# Patient Record
Sex: Male | Born: 1950 | Race: White | Hispanic: No | Marital: Married | Smoking: Former smoker
Health system: Southern US, Community
[De-identification: ages and names within clinical notes are randomized; demographics above are authoritative.]

## PROBLEM LIST (undated history)

## (undated) DIAGNOSIS — I10 Essential (primary) hypertension: Secondary | ICD-10-CM

## (undated) DIAGNOSIS — E785 Hyperlipidemia, unspecified: Secondary | ICD-10-CM

## (undated) DIAGNOSIS — J449 Chronic obstructive pulmonary disease, unspecified: Secondary | ICD-10-CM

## (undated) DIAGNOSIS — M1990 Unspecified osteoarthritis, unspecified site: Secondary | ICD-10-CM

## (undated) DIAGNOSIS — E119 Type 2 diabetes mellitus without complications: Secondary | ICD-10-CM

## (undated) DIAGNOSIS — F0390 Unspecified dementia without behavioral disturbance: Secondary | ICD-10-CM

## (undated) DIAGNOSIS — I219 Acute myocardial infarction, unspecified: Secondary | ICD-10-CM

## (undated) DIAGNOSIS — I2510 Atherosclerotic heart disease of native coronary artery without angina pectoris: Secondary | ICD-10-CM

## (undated) DIAGNOSIS — K219 Gastro-esophageal reflux disease without esophagitis: Secondary | ICD-10-CM

## (undated) DIAGNOSIS — F329 Major depressive disorder, single episode, unspecified: Secondary | ICD-10-CM

## (undated) DIAGNOSIS — Z8669 Personal history of other diseases of the nervous system and sense organs: Secondary | ICD-10-CM

## (undated) DIAGNOSIS — F419 Anxiety disorder, unspecified: Secondary | ICD-10-CM

## (undated) DIAGNOSIS — F32A Depression, unspecified: Secondary | ICD-10-CM

## (undated) HISTORY — PX: TONSILLECTOMY: SUR1361

## (undated) HISTORY — PX: SHOULDER OPEN ROTATOR CUFF REPAIR: SHX2407

## (undated) HISTORY — PX: APPENDECTOMY: SHX54

## (undated) HISTORY — PX: CHOLECYSTECTOMY OPEN: SUR202

## (undated) HISTORY — PX: HAMMER TOE SURGERY: SHX385

## (undated) HISTORY — DX: Type 2 diabetes mellitus without complications: E11.9

## (undated) HISTORY — DX: Hyperlipidemia, unspecified: E78.5

## (undated) HISTORY — PX: KNEE ARTHROSCOPY: SUR90

## (undated) HISTORY — DX: Essential (primary) hypertension: I10

## (undated) HISTORY — DX: Chronic obstructive pulmonary disease, unspecified: J44.9

## (undated) HISTORY — DX: Personal history of other diseases of the nervous system and sense organs: Z86.69

## (undated) HISTORY — PX: ROTATOR CUFF REPAIR: SHX139

---

## 1997-12-09 ENCOUNTER — Inpatient Hospital Stay (HOSPITAL_COMMUNITY): Admission: EM | Admit: 1997-12-09 | Discharge: 1997-12-12 | Payer: Self-pay | Admitting: Cardiology

## 1999-01-13 ENCOUNTER — Ambulatory Visit (HOSPITAL_BASED_OUTPATIENT_CLINIC_OR_DEPARTMENT_OTHER): Admission: RE | Admit: 1999-01-13 | Discharge: 1999-01-13 | Payer: Self-pay | Admitting: Orthopedic Surgery

## 2002-10-17 ENCOUNTER — Inpatient Hospital Stay (HOSPITAL_COMMUNITY): Admission: EM | Admit: 2002-10-17 | Discharge: 2002-10-18 | Payer: Self-pay | Admitting: Cardiology

## 2004-01-08 ENCOUNTER — Observation Stay (HOSPITAL_COMMUNITY): Admission: RE | Admit: 2004-01-08 | Discharge: 2004-01-09 | Payer: Self-pay | Admitting: Orthopedic Surgery

## 2005-08-03 ENCOUNTER — Ambulatory Visit (HOSPITAL_COMMUNITY): Admission: RE | Admit: 2005-08-03 | Discharge: 2005-08-03 | Payer: Self-pay | Admitting: Orthopedic Surgery

## 2007-07-12 ENCOUNTER — Ambulatory Visit (HOSPITAL_COMMUNITY): Payer: Self-pay | Admitting: Psychiatry

## 2007-07-16 ENCOUNTER — Ambulatory Visit (HOSPITAL_COMMUNITY): Payer: Self-pay | Admitting: Psychiatry

## 2007-07-16 ENCOUNTER — Emergency Department (HOSPITAL_COMMUNITY): Admission: EM | Admit: 2007-07-16 | Discharge: 2007-07-16 | Payer: Self-pay | Admitting: Emergency Medicine

## 2007-07-16 ENCOUNTER — Inpatient Hospital Stay (HOSPITAL_COMMUNITY): Admission: RE | Admit: 2007-07-16 | Discharge: 2007-07-27 | Payer: Self-pay | Admitting: *Deleted

## 2007-07-16 ENCOUNTER — Ambulatory Visit: Payer: Self-pay | Admitting: *Deleted

## 2007-07-22 ENCOUNTER — Emergency Department (HOSPITAL_COMMUNITY): Admission: EM | Admit: 2007-07-22 | Discharge: 2007-07-22 | Payer: Self-pay | Admitting: Emergency Medicine

## 2007-07-23 ENCOUNTER — Encounter (INDEPENDENT_AMBULATORY_CARE_PROVIDER_SITE_OTHER): Payer: Self-pay | Admitting: Emergency Medicine

## 2007-07-23 ENCOUNTER — Ambulatory Visit: Payer: Self-pay | Admitting: Vascular Surgery

## 2007-07-23 ENCOUNTER — Ambulatory Visit (HOSPITAL_COMMUNITY): Admission: RE | Admit: 2007-07-23 | Discharge: 2007-07-23 | Payer: Self-pay | Admitting: *Deleted

## 2007-08-03 ENCOUNTER — Ambulatory Visit (HOSPITAL_COMMUNITY): Payer: Self-pay | Admitting: Psychiatry

## 2007-08-08 ENCOUNTER — Ambulatory Visit (HOSPITAL_COMMUNITY): Payer: Self-pay | Admitting: Psychiatry

## 2007-08-22 ENCOUNTER — Ambulatory Visit (HOSPITAL_COMMUNITY): Payer: Self-pay | Admitting: Psychiatry

## 2007-09-04 ENCOUNTER — Ambulatory Visit (HOSPITAL_COMMUNITY): Payer: Self-pay | Admitting: Psychiatry

## 2007-09-11 ENCOUNTER — Ambulatory Visit (HOSPITAL_COMMUNITY): Payer: Self-pay | Admitting: Psychiatry

## 2007-09-25 ENCOUNTER — Ambulatory Visit (HOSPITAL_COMMUNITY): Payer: Self-pay | Admitting: Psychiatry

## 2007-10-02 ENCOUNTER — Ambulatory Visit (HOSPITAL_COMMUNITY): Payer: Self-pay | Admitting: Psychiatry

## 2007-11-06 ENCOUNTER — Ambulatory Visit (HOSPITAL_COMMUNITY): Payer: Self-pay | Admitting: Psychiatry

## 2007-12-25 ENCOUNTER — Ambulatory Visit (HOSPITAL_COMMUNITY): Payer: Self-pay | Admitting: Psychiatry

## 2010-05-25 ENCOUNTER — Ambulatory Visit: Payer: Self-pay | Admitting: Cardiology
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
## 2010-11-12 NOTE — Discharge Summary (Signed)
NAME:  [name], [name]                          ACCOUNT NO.:  [id number]   MEDICAL RECORD NO.:  [id number]                   PATIENT TYPE:  INP   LOCATION:  6523                                 FACILITY:  [facility]   PHYSICIAN:  Learta Codding, M.D.                 DATE OF BIRTH:  1950/12/31   DATE OF ADMISSION:  10/17/2002  DATE OF DISCHARGE:  10/18/2002                           DISCHARGE SUMMARY - REFERRING   SUMMARY OF HISTORY:  The patient is a 60 year old white male who presented  to [facility] with sudden onset of chest heaviness radiating into  both arms associated with shortness of breath and diaphoresis.  He received  one sublingual nitroglycerin with some relief and more relief after the  third nitroglycerin.  He noted he had been working in lifting some copper,  approximately 40 pounds, when this began.  It was noted that he could not  rate his discomfort and he did have a hypotensive reaction to the  nitroglycerin.  He describes a prior episode approximately two weeks ago  with some chest tightness and has noted that he has had weakness and fatigue  over the past 2-3 weeks and he has not felt right.   His history is notable for borderline diabetes for the preceding two years  (diet controlled), hyperlipidemia (untreated), a normal heart  catheterization approximately five years ago, continued tobacco use, without  any early family history of coronary artery disease.   LABORATORY DATA:  Admission H&H was 15.2 and 44.7, normal indices, platelets  242, WBC 9.2.  Sodium 138, potassium 4.4, BUN 17, creatinine 1.2, glucose  136.  Initial total CK was 160 with MB of 2, troponin-I 0.01.  PT 12.3, PTT  26.  Second CK was 111 with an MB of 1.8 and troponin 0.01.   EKG shows normal sinus rhythm, right axis deviation, possible RVH, and left  atrial enlargement.   HOSPITAL COURSE:  The patient was transferred to [facility] to  undergo cardiac catheterization.  This was  performed on April 22 by Dr.  Antoine Poche.  According to Dr. Jenene Slicker note, he had proximal luminal  irregularities in the mid LAD of 30%-40% with muscle bridging.  The diagonal  was small, the diagonal #2 was moderate size, diagonal #3 was very small  with ostial 99% lesion.  The OM had a large mid 25% lesion.  RCA was  dominant with diffuse luminal irregularities.  EF was 55% without wall  motion abnormalities.  Dr. Antoine Poche noted that he had a high-grade branch  vessel and a very small diagonal branch and now obstructive LAD disease and  diffuse luminal irregularities elsewhere.  He felt that he should be  continued on medical management with risk reduction.  Post sheath removal  and bedrest, he was ambulating without difficulty.  Smoking cessation  consult was performed.  April 23, after review of the chart, Dr.  Myrtis Ser felt  that the patient could be discharged home.   DISCHARGE DIAGNOSES:  1. Noncardiac chest discomfort.  2. Coronary artery disease.  3. Tobacco use.  4. Hyperlipidemia.  5. Diet-controlled diabetes.   DISPOSITION:  The patient is discharged home.   DISCHARGE MEDICATIONS:  He was given a new prescription for Zocor 20 mg  q.h.s.  He was asked to begin coated aspirin 325 daily and he may continue  his vitamins.  He also received a prescription for Wellbutrin 150 mg one  tablet daily for three days, then one tablet b.i.d. for seven weeks.  No  refills.   ACTIVITY:  He was advised no lifting, driving, sexual activity, or heavy  exertion for two days.  Given permission to return to work on Monday.   DIET:  Asked to maintain low-salt/fat/cholesterol ADA diet.   DISCHARGE INSTRUCTIONS:  If he has any problems with his catheterization  site, he was asked to call us.  He was advised no smoking or tobacco  products.   FOLLOW UP:  He will see Suszanne Conners. Duran, P.A.-C. on May 10 at 1:30 p.m. at  the [facility] office.  He was also asked to arrange followup appointment with Dr.   Sherryll Burger for further evaluation of his weakness and if he has any reoccurring  symptoms.  When the patient follows up with Suszanne Conners. Duran, P.A.-C.,  cardiac risk factor modifications will need to be reviewed, especially in  regards to recent lipid panel.  He will need fasting lipids and liver  function tests in approximately six weeks, since Zocor was initiated.  Hopefully, he will have discontinued smoking.  Consideration should be given  to checking a hemoglobin A1c to see how well his sugars have actually been  well controlled in an exercise program.     Joellyn Rued, P.A. [facility]                    Learta Codding, M.D.    EW/MEDQ  D:  10/18/2002  T:  10/19/2002  Job:  [id number]   cc:   Willa Rough, M.D.   Kirstie Peri  [address] [place]  [place] [address]  Fax: [phone]

## 2010-11-12 NOTE — Op Note (Signed)
NAME:  [name], [name]                          ACCOUNT NO.:  [id number]   MEDICAL RECORD NO.:  [id number]                   PATIENT TYPE:  AMB   LOCATION:  DAY                                  FACILITY:  [facility]   PHYSICIAN:  Marlowe Kays, M.D.               DATE OF BIRTH:  19-Jan-1951   DATE OF PROCEDURE:  01/08/2004  DATE OF DISCHARGE:                                 OPERATIVE REPORT   PREOPERATIVE DIAGNOSES:  1. Degenerative arthritis, acromioclavicular joint.  2. Labral tear.  3. Rotator cuff tendonopathy.   POSTOPERATIVE DIAGNOSES:  1. Degenerative arthritis, acromioclavicular joint.  2. Labral tear.  3. Rotator cuff tendonopathy.   OPERATION/PROCEDURE:  1. Right shoulder arthroscopy with debridement of labrum and arthroscopic     subacromial decompression.  2. Open resection distal clavicle, right shoulder.   SURGEON:  Marlowe Kays, M.D.   ASSISTANTDruscilla Brownie. Idolina Primer, P.A.-C.   ANESTHESIA:  General.   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  MRA demonstrated a type SLAP injury, tendonopathy of  the rotator cuff and degenerative arthritis of the [facility] joint.  He has had  chronic, in fact, increased pain here recently.  He had tenderness at the [facility]  joint.   DESCRIPTION OF PROCEDURE:  Satisfactory general anesthesia preceded by  interscalene block.  Beach chair position on [employer] frame.  The right  shoulder girdle was prepped with a DuraPrep, draped in a sterile field.  The  shoulder joint was marked out with a marking pen and subacromial space was  injected with Marcaine plus adrenalin for hemostasis.  Through the posterior  soft spot portal, I was able to atraumatically enter the glenohumeral joint.  There was some synovitis present.  Rotator cuff, biceps tendon and humeral  head were intact.  He did have some minor  disruption of the area of  attachments, biceps tendon but the labrum itself appeared to be relatively  intact.  There was enough disruption of the labrum at the anchor to cause  impingement-type problems.  I advanced the scope to the biceps and  subscapularis anteriorly.  Used a switching stick and made the anterior  incision.  Over the switching stick, I placed the metal cannula followed by  a 4.2 shaver and shaved the labrum, biceps anchored down to a smooth  __________.  All fluid possible was evacuated from the glenohumeral joint  and redirected the scope in the subacromial area through a lateral portal,  introduced a 4.2 shaver.  He had a large amount of bursitis tissue.  We sent  pictures and then resected with a 4.2 shaver, followed this with the  ArthroCare vaporizer, and began removing soft tissue from the surface of the  acromion and around the anterior  acromial area.  I brought in a 4.0 oval bur  and shaved down the subacromial space until there was a wide decompression,  alternating back and forth between the vaporizer and the bur during this  process.  Took documentary pictures and the arm to the side and arm  abducted.  A little roughening of the rotator cuff smoothed down.  I then  evacuated fluid from the subacromial space and made a short incision on the  distal clavicle and AC joint which I exposed with cutting cautery and small  elevator.  I then used baby Homans undermining the clavicle, roughly 1.5 cm  from the [facility] joint.  With micro saw, I amputated the clavicle at this point  and removed it using towel clip and cutting cautery.  Small spicules of bone  on the surface of the remaining clavicle were removed with a small rongeur  and placed bone wax over the raw bone and packed the gap formed by the bone  resection with Gelfoam.  Then closed the overlying space with interrupted #1  Vicryl.  Subcutaneous tissue was closed with 2-0 Vicryl and placed Steri-  Strips on the  skin with 4-0 nylon in the portals.  Betadine and Adaptic, dry  sterile dressing, followed by shoulder immobilizer.  He tolerated the  procedure well and was taken to the recovery room in satisfactory condition  with no complications.                                               Marlowe Kays, M.D.    JA/MEDQ  D:  01/08/2004  T:  01/08/2004  Job:  [id number]

## 2010-11-12 NOTE — Cardiovascular Report (Signed)
NAME:  [name], [name]                          ACCOUNT NO.:  [id number]   MEDICAL RECORD NO.:  [id number]                   PATIENT TYPE:  INP   LOCATION:  2853                                 FACILITY:  [facility]   PHYSICIAN:  Rollene Rotunda, M.D.                DATE OF BIRTH:  [birth year]   DATE OF PROCEDURE:  10/17/2002  DATE OF DISCHARGE:                              CARDIAC CATHETERIZATION   PRIMARY CARE PHYSICIAN:  Kirstie Peri, M.D.   CARDIOLOGIST:  [facility], [place], [place].   PROCEDURE:  Left heart catheterization/coronary arteriography.   INDICATIONS FOR PROCEDURE:  Evaluate patient with unstable angina.   PROCEDURAL NOTE:  Left heart catheterization was performed via the right  femoral artery.  The artery was cannulated using an anterior wall puncture.  A 6-French arterial sheath was inserted via the modified Seldinger  technique.  Preformed Judkins and a pigtail catheter were utilized.  The  patient tolerated the procedure well and left the lab in stable condition.   RESULTS:  1. Hemodynamics     A. LV:  100/4.     B. AO:  99/65.  2. Coronaries     A. The left main was normal.     B. The LAD had proximal luminal irregularities.  There was a mid long 30%        to 40% stenosis with a muscle bridge.  There was a small first        diagonal and a moderate-sized second diagonal which were both normal.        A third diagonal was very small with an ostial 99% stenosis.     C. The circumflex in the AV groove had luminal irregularities.  There was        a first obtuse marginal which was large with a mid 25% stenosis.     D. The right coronary artery was a very large dominant vessel.  There        were diffuse luminal irregularities.     E. Of note, there was slow flow in the LAD and RCA.     F. Left ventriculogram:  A left ventriculogram was obtained in the RAO        projection.  The EF was 65% with        normal wall motion.     G. Aortic root:  An aortic root was  obtained.  This was normal.  There        was no evidence of aortic insufficiency or clear evidence of        dissection.                                                Rollene Rotunda, M.D.    JH/MEDQ  D:  10/17/2002  T:  10/18/2002  Job:  [id number]   cc:   Kirstie Peri  [address]  [place] [address]  Fax: [phone]   [facility], [place], [place].

## 2010-11-12 NOTE — Op Note (Signed)
[name], [name]                ACCOUNT NO.:  [id number]   MEDICAL RECORD NO.:  [id number]          PATIENT TYPE:  AMB   LOCATION:  DAY                          FACILITY:  [facility]   PHYSICIAN:  Marlowe Kays, M.D.  DATE OF BIRTH:  Aug 03, 1950   DATE OF PROCEDURE:  08/03/2005  DATE OF DISCHARGE:                                 OPERATIVE REPORT   PREOPERATIVE DIAGNOSES:  1.  Postoperative adhesions.  2.  Suspected labral tear.  3.  Suspected partial articular surface tear of rotator cuff right shoulder.   POSTOPERATIVE DIAGNOSES:  1.  Postoperative adhesions.  2.  Suspected labral tear.  3.  Suspected partial articular surface tear of rotator cuff right shoulder.   OPERATION:  1.  Right shoulder arthroscopy with lysis of intra-articular adhesions,      debridement of anterior and posterior labral degenerative type tears,      shaving of underneath surface of the rotator cuff, debridement of      several areas of chondromalacia of the humeral head.  2.  Subacromial lysis of adhesions.   SURGEON:  Marlowe Kays, M.D.   ASSISTANT:  Mr. Idolina Primer, [place].   ANESTHESIA:  General.   PATHOLOGY AND JUSTIFICATION FOR PROCEDURE:  His original surgical procedure  was then was on January 08, 2004 which included the right shoulder arthroscopy  with debridement of labrum, arthroscopic subacromial decompression, open  resection of distal right clavicle. He attempted to return to heavy work,  continued to smoke and developed recurrent pain in the right shoulder with  an MRI on October 16, 2004 demonstrating some rotator cuff tendinopathy,  suspected torn superior labrum and suspicion of postop adhesions. He was  seen for a second opinion at the request of the Comp carrier and a repeat  arthroscopy was recommended; I concurred which is the reason he is here  today. See operative description below for details of pathology.   DESCRIPTION OF PROCEDURE:  Satisfactory general anesthesia,  beach-chair  position on the [employer]lein frame, right shoulder girdle was prepped with  DuraPrep, draped in a sterile field. The anatomy of the shoulder joint was  marked out and subacromial space, lateral and posterior portals infiltrated  with 0.5% Marcaine with adrenalin. Through a posterior soft spot portal, I  was able to easily reenter the glenohumeral joint. There were a good bit of  postoperative adhesions anteriorly and the labrum demonstrated some small  flap type degenerative tears both anterior and posterior to the biceps  insertion. The biceps tendon itself looked normal. The underneath surface of  the rotator cuff near its insertion did have some very minimal disruption.  After picturing all of this, I then advanced the scope between the biceps  and the subscapularis, used a switching stick, made an anterior incision and  over this placed a metal cannula. A 4.2 shaver was placed into the joint  where all these areas were debrided down including an area of the humeral  head which had some partial detachment of areas of chondromalacia that had  almost a cracked eggshell type appearance. After cleaning up the  glenohumeral joint, I then redirected the scope into the subacromial space.  There were some postop adhesions there and in particularly one band  anteriorly. There did not appear to be any residual impingement and no  rotator cuff tear on the bursal surface was noted. Through the lateral  portal I placed a blunt trocar followed by a 4.2 shaver cleaning up some of  the adhesions. I then followed this with the 90 degree Arthrocare vaporizer  removing particularly the band in the anterior subacromial space. I then did  some additional cleaning up with the shaver until all adhesions had been  cleared. I then evacuated all fluid from the subacromial space,  infiltrated  the three portals with the Marcaine with adrenalin and closed them with 4-0  nylon. Betadine Adaptic dry sterile  dressing, shoulder immobilizer applied.  He tolerated the procedure well and was taken to the recovery room in  satisfactory condition with no known complications.           ______________________________  Marlowe Kays, M.D.     JA/MEDQ  D:  08/03/2005  T:  08/03/2005  Job:  [id number]

## 2010-12-20 ENCOUNTER — Inpatient Hospital Stay (HOSPITAL_COMMUNITY)
Admission: AD | Admit: 2010-12-20 | Discharge: 2010-12-29 | DRG: 233 | Disposition: A | Discharge status: Skilled Nursing Facility | Payer: PRIVATE HEALTH INSURANCE | Source: Other Acute Inpatient Hospital | Attending: Cardiothoracic Surgery | Admitting: Cardiothoracic Surgery

## 2010-12-20 DIAGNOSIS — I214 Non-ST elevation (NSTEMI) myocardial infarction: Principal | ICD-10-CM | POA: Diagnosis present

## 2010-12-20 DIAGNOSIS — E119 Type 2 diabetes mellitus without complications: Secondary | ICD-10-CM | POA: Diagnosis present

## 2010-12-20 DIAGNOSIS — J988 Other specified respiratory disorders: Secondary | ICD-10-CM | POA: Diagnosis not present

## 2010-12-20 DIAGNOSIS — K5900 Constipation, unspecified: Secondary | ICD-10-CM | POA: Diagnosis not present

## 2010-12-20 DIAGNOSIS — F17200 Nicotine dependence, unspecified, uncomplicated: Secondary | ICD-10-CM | POA: Diagnosis present

## 2010-12-20 DIAGNOSIS — R079 Chest pain, unspecified: Secondary | ICD-10-CM

## 2010-12-20 DIAGNOSIS — E8779 Other fluid overload: Secondary | ICD-10-CM | POA: Diagnosis not present

## 2010-12-20 DIAGNOSIS — I2510 Atherosclerotic heart disease of native coronary artery without angina pectoris: Secondary | ICD-10-CM

## 2010-12-20 DIAGNOSIS — R443 Hallucinations, unspecified: Secondary | ICD-10-CM | POA: Diagnosis not present

## 2010-12-20 DIAGNOSIS — E785 Hyperlipidemia, unspecified: Secondary | ICD-10-CM | POA: Diagnosis present

## 2010-12-20 DIAGNOSIS — J189 Pneumonia, unspecified organism: Secondary | ICD-10-CM | POA: Diagnosis not present

## 2010-12-20 DIAGNOSIS — D62 Acute posthemorrhagic anemia: Secondary | ICD-10-CM | POA: Diagnosis not present

## 2010-12-20 DIAGNOSIS — R7989 Other specified abnormal findings of blood chemistry: Secondary | ICD-10-CM

## 2010-12-20 DIAGNOSIS — I10 Essential (primary) hypertension: Secondary | ICD-10-CM | POA: Diagnosis present

## 2010-12-20 LAB — BLOOD GAS, ARTERIAL
Acid-Base Excess: 3 mmol/L — ABNORMAL HIGH (ref 0.0–2.0)
Bicarbonate: 27.8 mEq/L — ABNORMAL HIGH (ref 20.0–24.0)
Drawn by: 275531
O2 Content: 2 L/min
O2 Saturation: 89.8 %
Patient temperature: 98.6
TCO2: 29.3 mmol/L (ref 0–100)
pCO2 arterial: 48.7 mmHg — ABNORMAL HIGH (ref 35.0–45.0)
pH, Arterial: 7.375 (ref 7.350–7.450)
pO2, Arterial: 71.4 mmHg — ABNORMAL LOW (ref 80.0–100.0)

## 2010-12-20 LAB — CARDIAC PANEL(CRET KIN+CKTOT+MB+TROPI)
CK, MB: 8 ng/mL (ref 0.3–4.0)
Relative Index: 4.3 — ABNORMAL HIGH (ref 0.0–2.5)
Troponin I: 2.71 ng/mL (ref <0.30)

## 2010-12-20 LAB — URINALYSIS, ROUTINE W REFLEX MICROSCOPIC
Bilirubin Urine: NEGATIVE
Glucose, UA: NEGATIVE mg/dL
Hgb urine dipstick: NEGATIVE
Ketones, ur: NEGATIVE mg/dL
Leukocytes, UA: NEGATIVE
Nitrite: NEGATIVE
Protein, ur: NEGATIVE mg/dL
Specific Gravity, Urine: 1.022 (ref 1.005–1.030)
Urobilinogen, UA: 0.2 mg/dL (ref 0.0–1.0)
pH: 6 (ref 5.0–8.0)

## 2010-12-20 LAB — APTT: aPTT: 33 seconds (ref 24–37)

## 2010-12-20 LAB — COMPREHENSIVE METABOLIC PANEL
Albumin: 3.5 g/dL (ref 3.5–5.2)
Creatinine, Ser: 0.95 mg/dL (ref 0.50–1.35)
GFR calc Af Amer: >60 mL/min (ref >60)
GFR calc non Af Amer: >60 mL/min (ref >60)
Glucose, Bld: 96 mg/dL (ref 70–99)
Total Bilirubin: 0.2 mg/dL — ABNORMAL LOW (ref 0.3–1.2)

## 2010-12-20 LAB — SURGICAL PCR SCREEN: Staphylococcus aureus: POSITIVE — AB

## 2010-12-20 LAB — PROTIME-INR: Prothrombin Time: 13.8 seconds (ref 11.6–15.2)

## 2010-12-20 LAB — GLUCOSE, CAPILLARY
Glucose-Capillary: 82 mg/dL (ref 70–99)
Glucose-Capillary: 91 mg/dL (ref 70–99)
Glucose-Capillary: 112 mg/dL — ABNORMAL HIGH (ref 70–99)

## 2010-12-20 LAB — CBC
Hemoglobin: 10.8 g/dL — ABNORMAL LOW (ref 13.0–17.0)
MCH: 27.3 pg (ref 26.0–34.0)
MCHC: 31.8 g/dL (ref 30.0–36.0)
MCV: 85.9 fL (ref 78.0–100.0)
RDW: 14.2 % (ref 11.5–15.5)

## 2010-12-20 LAB — MRSA PCR SCREENING: MRSA by PCR: NEGATIVE

## 2010-12-21 ENCOUNTER — Inpatient Hospital Stay (HOSPITAL_COMMUNITY): Payer: PRIVATE HEALTH INSURANCE

## 2010-12-21 DIAGNOSIS — I2510 Atherosclerotic heart disease of native coronary artery without angina pectoris: Secondary | ICD-10-CM

## 2010-12-21 DIAGNOSIS — I219 Acute myocardial infarction, unspecified: Secondary | ICD-10-CM

## 2010-12-21 LAB — POCT I-STAT 4, (NA,K, GLUC, HGB,HCT)
Glucose, Bld: 98 mg/dL (ref 70–99)
Glucose, Bld: 90 mg/dL (ref 70–99)
Glucose, Bld: 149 mg/dL — ABNORMAL HIGH (ref 70–99)
Glucose, Bld: 122 mg/dL — ABNORMAL HIGH (ref 70–99)
HCT: 29 % — ABNORMAL LOW (ref 39.0–52.0)
HCT: 27 % — ABNORMAL LOW (ref 39.0–52.0)
HCT: 30 % — ABNORMAL LOW (ref 39.0–52.0)
Hemoglobin: 9.9 g/dL — ABNORMAL LOW (ref 13.0–17.0)
Hemoglobin: 7.1 g/dL — ABNORMAL LOW (ref 13.0–17.0)
Hemoglobin: 7.8 g/dL — ABNORMAL LOW (ref 13.0–17.0)
Hemoglobin: 10.2 g/dL — ABNORMAL LOW (ref 13.0–17.0)
Potassium: 3.8 mEq/L (ref 3.5–5.1)
Potassium: 3.2 mEq/L — ABNORMAL LOW (ref 3.5–5.1)
Potassium: 4.3 mEq/L (ref 3.5–5.1)
Potassium: 3.6 mEq/L (ref 3.5–5.1)
Sodium: 139 mEq/L (ref 135–145)
Sodium: 135 mEq/L (ref 135–145)
Sodium: 134 mEq/L — ABNORMAL LOW (ref 135–145)
Sodium: 137 mEq/L (ref 135–145)

## 2010-12-21 LAB — POCT I-STAT 3, ART BLOOD GAS (G3+)
Acid-Base Excess: 2 mmol/L (ref 0.0–2.0)
Acid-Base Excess: 1 mmol/L (ref 0.0–2.0)
Bicarbonate: 26.2 mEq/L — ABNORMAL HIGH (ref 20.0–24.0)
O2 Saturation: 91 %
Patient temperature: 36.1
TCO2: 27 mmol/L (ref 0–100)
pCO2 arterial: 39.6 mmHg (ref 35.0–45.0)
pH, Arterial: 7.449 (ref 7.350–7.450)
pH, Arterial: 7.429 (ref 7.350–7.450)

## 2010-12-21 LAB — LIPID PANEL
Cholesterol: 108 mg/dL (ref 0–200)
HDL: 26 mg/dL — ABNORMAL LOW (ref >39)
LDL Cholesterol: 49 mg/dL (ref 0–99)
Total CHOL/HDL Ratio: 4.2 RATIO
Triglycerides: 164 mg/dL — ABNORMAL HIGH (ref <150)
VLDL: 33 mg/dL (ref 0–40)

## 2010-12-21 LAB — PROTIME-INR
INR: 1.35 (ref 0.00–1.49)
Prothrombin Time: 16.9 seconds — ABNORMAL HIGH (ref 11.6–15.2)

## 2010-12-21 LAB — APTT: aPTT: 34 seconds (ref 24–37)

## 2010-12-21 LAB — BASIC METABOLIC PANEL
BUN: 13 mg/dL (ref 6–23)
CO2: 33 mEq/L — ABNORMAL HIGH (ref 19–32)
Calcium: 8.7 mg/dL (ref 8.4–10.5)
Chloride: 102 mEq/L (ref 96–112)
Creatinine, Ser: 1.09 mg/dL (ref 0.50–1.35)
GFR calc Af Amer: >60 mL/min (ref >60)
GFR calc non Af Amer: >60 mL/min (ref >60)
Glucose, Bld: 155 mg/dL — ABNORMAL HIGH (ref 70–99)
Potassium: 4.2 mEq/L (ref 3.5–5.1)
Sodium: 138 mEq/L (ref 135–145)

## 2010-12-21 LAB — GLUCOSE, CAPILLARY
Glucose-Capillary: 117 mg/dL — ABNORMAL HIGH (ref 70–99)
Glucose-Capillary: 110 mg/dL — ABNORMAL HIGH (ref 70–99)
Glucose-Capillary: 106 mg/dL — ABNORMAL HIGH (ref 70–99)

## 2010-12-21 LAB — CBC
HCT: 30.3 % — ABNORMAL LOW (ref 39.0–52.0)
HCT: 28.4 % — ABNORMAL LOW (ref 39.0–52.0)
Hemoglobin: 9.6 g/dL — ABNORMAL LOW (ref 13.0–17.0)
Hemoglobin: 9.3 g/dL — ABNORMAL LOW (ref 13.0–17.0)
MCH: 27.7 pg (ref 26.0–34.0)
MCH: 28 pg (ref 26.0–34.0)
MCHC: 31.7 g/dL (ref 30.0–36.0)
MCHC: 32.7 g/dL (ref 30.0–36.0)
MCV: 87.6 fL (ref 78.0–100.0)
MCV: 85.5 fL (ref 78.0–100.0)
Platelets: 258 10*3/uL (ref 150–400)
Platelets: 203 10*3/uL (ref 150–400)
RBC: 3.46 MIL/uL — ABNORMAL LOW (ref 4.22–5.81)
RBC: 3.32 MIL/uL — ABNORMAL LOW (ref 4.22–5.81)
RDW: 14.5 % (ref 11.5–15.5)
RDW: 14.3 % (ref 11.5–15.5)
WBC: 8.7 10*3/uL (ref 4.0–10.5)
WBC: 16.5 10*3/uL — ABNORMAL HIGH (ref 4.0–10.5)

## 2010-12-21 LAB — PLATELET COUNT: Platelets: 147 10*3/uL — ABNORMAL LOW (ref 150–400)

## 2010-12-21 LAB — HEPARIN LEVEL (UNFRACTIONATED): Heparin Unfractionated: 0.26 IU/mL — ABNORMAL LOW (ref 0.30–0.70)

## 2010-12-22 ENCOUNTER — Inpatient Hospital Stay (HOSPITAL_COMMUNITY): Payer: PRIVATE HEALTH INSURANCE

## 2010-12-22 DIAGNOSIS — IMO0001 Reserved for inherently not codable concepts without codable children: Secondary | ICD-10-CM

## 2010-12-22 DIAGNOSIS — E1165 Type 2 diabetes mellitus with hyperglycemia: Secondary | ICD-10-CM

## 2010-12-22 LAB — CBC
HCT: 30 % — ABNORMAL LOW (ref 39.0–52.0)
HCT: 28.1 % — ABNORMAL LOW (ref 39.0–52.0)
Hemoglobin: 9.7 g/dL — ABNORMAL LOW (ref 13.0–17.0)
Hemoglobin: 9.1 g/dL — ABNORMAL LOW (ref 13.0–17.0)
MCH: 28 pg (ref 26.0–34.0)
MCH: 27.8 pg (ref 26.0–34.0)
MCHC: 32.3 g/dL (ref 30.0–36.0)
MCHC: 32.4 g/dL (ref 30.0–36.0)
MCV: 86.5 fL (ref 78.0–100.0)
MCV: 85.9 fL (ref 78.0–100.0)
Platelets: 205 10*3/uL (ref 150–400)
Platelets: 220 10*3/uL (ref 150–400)
RBC: 3.47 MIL/uL — ABNORMAL LOW (ref 4.22–5.81)
RBC: 3.27 MIL/uL — ABNORMAL LOW (ref 4.22–5.81)
RDW: 14.4 % (ref 11.5–15.5)
RDW: 14.7 % (ref 11.5–15.5)
WBC: 17.3 10*3/uL — ABNORMAL HIGH (ref 4.0–10.5)
WBC: 19.1 10*3/uL — ABNORMAL HIGH (ref 4.0–10.5)

## 2010-12-22 LAB — POCT I-STAT 3, ART BLOOD GAS (G3+)
Bicarbonate: 24 mEq/L (ref 20.0–24.0)
Bicarbonate: 24.1 mEq/L — ABNORMAL HIGH (ref 20.0–24.0)
O2 Saturation: 98 %
Patient temperature: 99.1
Patient temperature: 98.6
TCO2: 25 mmol/L (ref 0–100)
pCO2 arterial: 41.4 mmHg (ref 35.0–45.0)
pH, Arterial: 7.373 (ref 7.350–7.450)

## 2010-12-22 LAB — GLUCOSE, CAPILLARY
Glucose-Capillary: 131 mg/dL — ABNORMAL HIGH (ref 70–99)
Glucose-Capillary: 209 mg/dL — ABNORMAL HIGH (ref 70–99)
Glucose-Capillary: 179 mg/dL — ABNORMAL HIGH (ref 70–99)

## 2010-12-22 LAB — BASIC METABOLIC PANEL
BUN: 12 mg/dL (ref 6–23)
CO2: 24 mEq/L (ref 19–32)
Calcium: 7.5 mg/dL — ABNORMAL LOW (ref 8.4–10.5)
Chloride: 102 mEq/L (ref 96–112)
Creatinine, Ser: 0.82 mg/dL (ref 0.50–1.35)
GFR calc Af Amer: >60 mL/min (ref >60)
GFR calc non Af Amer: >60 mL/min (ref >60)
Glucose, Bld: 141 mg/dL — ABNORMAL HIGH (ref 70–99)
Potassium: 4.6 mEq/L (ref 3.5–5.1)
Sodium: 135 mEq/L (ref 135–145)

## 2010-12-22 LAB — CREATININE, SERUM
Creatinine, Ser: 1.21 mg/dL (ref 0.50–1.35)
GFR calc Af Amer: >60 mL/min (ref >60)
GFR calc non Af Amer: >60 mL/min (ref >60)

## 2010-12-22 LAB — MAGNESIUM
Magnesium: 2.3 mg/dL (ref 1.5–2.5)
Magnesium: 2.1 mg/dL (ref 1.5–2.5)

## 2010-12-22 LAB — POCT I-STAT, CHEM 8
BUN: 15 mg/dL (ref 6–23)
Chloride: 96 mEq/L (ref 96–112)
Sodium: 135 mEq/L (ref 135–145)

## 2010-12-23 ENCOUNTER — Inpatient Hospital Stay (HOSPITAL_COMMUNITY): Payer: PRIVATE HEALTH INSURANCE

## 2010-12-23 DIAGNOSIS — E1165 Type 2 diabetes mellitus with hyperglycemia: Secondary | ICD-10-CM

## 2010-12-23 DIAGNOSIS — IMO0001 Reserved for inherently not codable concepts without codable children: Secondary | ICD-10-CM

## 2010-12-23 LAB — EXPECTORATED SPUTUM ASSESSMENT W GRAM STAIN, RFLX TO RESP C

## 2010-12-23 LAB — CBC
HCT: 26.4 % — ABNORMAL LOW (ref 39.0–52.0)
Hemoglobin: 8.6 g/dL — ABNORMAL LOW (ref 13.0–17.0)
MCH: 28.1 pg (ref 26.0–34.0)
MCHC: 32.6 g/dL (ref 30.0–36.0)
MCV: 86.3 fL (ref 78.0–100.0)
Platelets: 189 10*3/uL (ref 150–400)
RBC: 3.06 MIL/uL — ABNORMAL LOW (ref 4.22–5.81)
RDW: 15.1 % (ref 11.5–15.5)
WBC: 17.9 10*3/uL — ABNORMAL HIGH (ref 4.0–10.5)

## 2010-12-23 LAB — GLUCOSE, CAPILLARY
Glucose-Capillary: 128 mg/dL — ABNORMAL HIGH (ref 70–99)
Glucose-Capillary: 146 mg/dL — ABNORMAL HIGH (ref 70–99)
Glucose-Capillary: 187 mg/dL — ABNORMAL HIGH (ref 70–99)
Glucose-Capillary: 168 mg/dL — ABNORMAL HIGH (ref 70–99)

## 2010-12-23 LAB — URINE CULTURE
Colony Count: NO GROWTH
Culture  Setup Time: 201206280159
Culture: NO GROWTH

## 2010-12-23 LAB — BASIC METABOLIC PANEL
BUN: 16 mg/dL (ref 6–23)
CO2: 29 mEq/L (ref 19–32)
Calcium: 7.3 mg/dL — ABNORMAL LOW (ref 8.4–10.5)
Chloride: 97 mEq/L (ref 96–112)
Creatinine, Ser: 1.03 mg/dL (ref 0.50–1.35)
GFR calc Af Amer: >60 mL/min (ref >60)
GFR calc non Af Amer: >60 mL/min (ref >60)
Glucose, Bld: 137 mg/dL — ABNORMAL HIGH (ref 70–99)
Potassium: 3.8 mEq/L (ref 3.5–5.1)
Sodium: 133 mEq/L — ABNORMAL LOW (ref 135–145)

## 2010-12-23 NOTE — Op Note (Signed)
NAMESHALIN, VONBARGEN NO.:  [id number]  MEDICAL RECORD NO.:  [id number]  LOCATION:  2302                         FACILITY:  [facility]  PHYSICIAN:  Kerin Perna, M.D.  DATE OF BIRTH:  04-19-1951  DATE OF PROCEDURE:  12/21/2010 DATE OF DISCHARGE:                              OPERATIVE REPORT   OPERATION: 1. Coronary artery bypass grafting x3 (left internal mammary artery to     left anterior descending, saphenous vein graft to posterior     descending, saphenous vein graft to posterolateral branch of right     coronary). 2. Endoscopic harvest of right leg greater saphenous vein.  PREOPERATIVE DIAGNOSIS:  Unstable angina with non-ST elevation myocardial infarction.  POSTOPERATIVE DIAGNOSIS:  Unstable angina with non-ST elevation myocardial infarction.  SURGEON:  Kerin Perna, MD  ASSISTANT:  Rowe Clack, PA-C  ANESTHESIA:  General by Dr. Bedelia Person.  INDICATIONS:  The patient is a 60 year old diabetic, smoker who presents with chest pain, positive cardiac enzymes and was cathed.  This demonstrated high-grade 99% stenosis of the right coronary extending into the posterior descending bifurcation.  There is also an 80% stenosis of the proximal LAD.  LVEF was preserved.  He was felt to be candidate for surgical revascularization and he was placed on IV heparin.  His chest pain resolved and he was scheduled for surgery today.  Prior to surgery, I reviewed the results of cardiac cath with the patient and his family and discussed the indications, benefits and risks of coronary artery bypass grafting for treatment of his coronary artery disease.  I discussed the major issues of surgery including the location of the surgical incisions, use of general anesthesia and cardiopulmonary bypass, and the expected postoperative hospital recovery.  I discussed the risks to him of coronary bypass surgery including risks of stroke, MI, pneumonia, bleeding, infection,  and death.  He understands these issues and agreed to proceed with surgery under what I felt was an informed consent.  OPERATIVE FINDINGS:  The saphenous vein was somewhat small but of adequate quality.  Coronaries were severely diseased but adequate targets at the location of the anastomoses.  The patient did not require any blood products for the surgery.  PROCEDURE:  The patient was brought to operating room and placed supine on the operating table.  General anesthesia was induced under invasive hemodynamic monitoring.  The chest, abdomen, and legs were prepped with Betadine and draped as a sterile field.  A sternal incision was made and the saphenous vein was harvested endoscopically from the right leg.  The left internal mammary artery was harvested as a pedicle graft from its origin at the subclavian vessels.  It was a good vessel with excellent flow.  The sternal retractor was placed and the pericardium was opened and suspended.  Heparin was administered after the vein was harvested and the ACT was therapeutic.  The patient was cannulated through pursestrings in the ascending aorta and right atrium and placed on cardiopulmonary bypass.  The coronaries were identified for grafting. The mammary artery and vein grafts were prepared for the distal anastomoses and cardioplegia catheters were placed for both antegrade and retrograde cold blood  cardioplegia.  The patient was cooled to 32 degrees and aortic crossclamp was carefully applied.  800 mL of cold blood cardioplegia was delivered with good cardioplegic arrest.  The distal coronary anastomoses were then performed.  First distal anastomosis was the posterior descending branch of the right coronary. This had 99% stenosis.  Reverse saphenous vein sewn end-to-side with running 7-0 Prolene with good flow through graft.  The second distal anastomosis was to the posterolateral branch of the right coronary. This was a 1.5-mm vessel and  had a 99% proximal stenosis.  Reverse saphenous vein was small caliber, was sewn end-to-side with running 7-0 Prolene with good flow through the graft.  Cardioplegia was redosed. The third distal anastomosis was to the midportion of the LAD.  There was a proximal 70-80% stenosis.  The left IMA pedicle was brought through an opening in the left lateral pericardium and was brought down onto the LAD and sewn end-to-side with running 8-0 Prolene.  There was good flow through the anastomosis after briefly releasing the pedicle bulldog on the mammary artery.  The bulldog was reapplied and the pedicle was secured to the epicardium.  Cardioplegia was redosed.  While the crossclamp was still in place, two proximal vein anastomoses were performed on the ascending aorta using a 4.0-mm punch running 7-0 Prolene.  Prior to removing the crossclamp, air was vented from the coronaries with a dose of retrograde warm blood cardioplegia.  The heart resumed a spontaneous rhythm.  The bypass grafts were opened and each had good flow.  Proximal and distal anastomoses were checked and found to be hemostatic.  The patient was rewarmed to 37 degrees and the cardioplegia catheters were removed.  The lungs were re-expanded and ventilated and the patient was then weaned off bypass without inotropes. Blood pressure and cardiac output were normal and protamine was administered without adverse reaction.  The cannulas removed and mediastinum was irrigated with warm saline.  The leg incision was irrigated and closed in a standard fashion.  The superior pericardial fat was closed over the aorta.  Two mediastinal and a left pleural chest tube were placed and brought out through separate incisions.  The sternum was closed with interrupted steel wire.  The pectoralis fascia was closed with running #1 Vicryl and subcutaneous and skin layers were closed with running Vicryl.  Sterile dressings were applied.  Total bypass time  was 95 minutes.     Kerin Perna, M.D.     PV/MEDQ  D:  12/22/2010  T:  12/23/2010  Job:  [id number]  cc:   Nejla Reasor M. [place], M.D.  Electronically Signed by Kerin Perna M.D. on 12/23/2010 02:56:14 PM

## 2010-12-23 NOTE — Consult Note (Signed)
NAMEBERTRUM, HELMSTETTER NO.:  [id number]  MEDICAL RECORD NO.:  [id number]  LOCATION:  2906                         FACILITY:  [facility]  PHYSICIAN:  Kerin Perna, M.D.  DATE OF BIRTH:  [birth year]  DATE OF CONSULTATION: DATE OF DISCHARGE:                                CONSULTATION   PHYSICIAN REQUESTING CONSULTATION:  Rollene Rotunda, MD, [facility]  PRIMARY CARE PHYSICIAN:  Kirstie Peri, MD, in [place].  REASON FOR CONSULTATION:  Severe multivessel coronary disease, unstable angina, non-ST-segment elevation MI.  HISTORY OF PRESENT ILLNESS:  I was asked to evaluate this 60 year old Caucasian diabetic male smoker for possible multivessel bypass grafting. He presented with unstable angina to [facility], and was found to have an elevated troponin without significant ST segment changes on his EKG.  He was transferred to [facility] for cardiac cath which was performed today by Dr. Antoine Poche.  This demonstrates a 99% stenosis of the distal right coronary at the PD/PL bifurcation as well as an 80% stenosis of proximal LAD.  EF was preserved and LVEDP was normal at 12 mmHg.  There is no evidence of aortic stenosis or mitral regurgitation and a 2-D echo is pending.  Based on his coronary anatomy and symptoms, he is felt to be candidate for surgical coronary revascularization.  He is currently medically stable on IV heparin and nitroglycerin and a sinus rhythm.  PAST MEDICAL HISTORY: 1. Hypertension. 2. Diabetes mellitus. 3. COPD with tobacco abuse history. 4. Dyslipidemia.  HOME MEDICATIONS: 1. Amaryl 4 mg p.o. b.i.d. 2. Aspirin 81 mg daily. 3. Protonix 40 mg p.o. daily. 4. Metformin 1 g p.o. b.i.d. 5. Mobic 75 mg p.o. b.i.d. 6. Colace 100 mg daily. 7. Gemfibrozil 600 mg p.o. b.i.d. 8. Multivitamin.  ALLERGIES:  CODEINE with GI intolerance.  SOCIAL HISTORY:  The patient is disabled from prior bilateral shoulder surgery.  He used to work at a [employer].  He smoked up until 2-3 months ago at least one pack per day and drinks alcohol occasionally.  FAMILY HISTORY:  Positive for diabetes and hypertension.  Negative for premature coronary disease and coronary bypass surgery.  REVIEW OF SYSTEMS:  Constitutional review is negative for fever, weight loss.  ENT review is negative for change in vision.  He has positive upper dental plates.  No active dental complaints.  No difficulty swallowing.  Thoracic review is negative for history of thoracic trauma, pneumothorax, recent symptoms of upper respiratory infection.  Cardiac review is positive for severe coronary disease without significant LV dysfunction or valvular disease and he has no history of arrhythmia.  GI review is positive for GERD, negative for hepatitis or jaundice.  He is status post cholecystectomy.  Endocrine review is positive for diabetes. Negative for thyroid disease.  Vascular review is negative for DVT, claudication, or TIA.  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  Hematologic review is negative for bleeding disorder, blood transfusion.  Anesthetic review is negative for idiosyncratic reaction anesthesia for his left knee surgery, his gallbladder surgery, and his bilateral shoulder surgery.  PHYSICAL EXAMINATION:  VITAL SIGNS:  The patient is 5 feet 8  inches, weighs 200 pounds.  Blood pressure is 130/70, pulse sinus 70. GENERAL:  He is a middle-aged Caucasian male in CCU, in no acute distress.  He was cath'ed via his right radial artery which is without active bleeding or hematoma. HEENT:  Normocephalic.  Pupils are equal.  Dentition is adequate. NECK:  Without JVD, mass, or bruit. LYMPHATICS:  Show no palpable supraclavicular or cervical adenopathy. CHEST:  Breath sounds are clear and there is no thoracic deformity. CARDIAC:  Regular rhythm without S3, gallop or murmur. ABDOMEN:  Obese, soft, nontender  without pulsatile mass. EXTREMITIES:  Reveal no clubbing, cyanosis, or edema.  Peripheral pulses are 2+ in all extremities.  There is no evidence of venous insufficiency of his lower extremities. NEUROLOGIC:  Alert and oriented without focal motor deficit.  LABORATORY DATA:  Reviewed the coronary arteriogram with Dr. Antoine Poche, agreed with impression of severe multivessel coronary artery disease with preserved LV function.  PLAN:  The patient will be prepared for bypass grafting the LAD posterior descending and posterolateral branches of right coronary tomorrow.  He will maintain IV heparin and nitroglycerin until surgery.     Kerin Perna, M.D.     PV/MEDQ  D:  12/20/2010  T:  12/21/2010  Job:  [id number]  Electronically Signed by Kerin Perna M.D. on 12/23/2010 02:56:09 PM

## 2010-12-24 ENCOUNTER — Inpatient Hospital Stay (HOSPITAL_COMMUNITY): Payer: PRIVATE HEALTH INSURANCE

## 2010-12-24 DIAGNOSIS — E1165 Type 2 diabetes mellitus with hyperglycemia: Secondary | ICD-10-CM

## 2010-12-24 DIAGNOSIS — IMO0001 Reserved for inherently not codable concepts without codable children: Secondary | ICD-10-CM

## 2010-12-24 LAB — CROSSMATCH
ABO/RH(D): A POS
Antibody Screen: NEGATIVE
Unit division: 0
Unit division: 0
Unit division: 0
Unit division: 0

## 2010-12-24 LAB — CBC
HCT: 26.7 % — ABNORMAL LOW (ref 39.0–52.0)
Hemoglobin: 8.7 g/dL — ABNORMAL LOW (ref 13.0–17.0)
MCH: 28.2 pg (ref 26.0–34.0)
MCHC: 32.6 g/dL (ref 30.0–36.0)
MCV: 86.7 fL (ref 78.0–100.0)
Platelets: 211 10*3/uL (ref 150–400)
RBC: 3.08 MIL/uL — ABNORMAL LOW (ref 4.22–5.81)
RDW: 15.2 % (ref 11.5–15.5)
WBC: 21.9 10*3/uL — ABNORMAL HIGH (ref 4.0–10.5)

## 2010-12-24 LAB — BASIC METABOLIC PANEL
BUN: 17 mg/dL (ref 6–23)
CO2: 30 mEq/L (ref 19–32)
Calcium: 8.5 mg/dL (ref 8.4–10.5)
Chloride: 94 mEq/L — ABNORMAL LOW (ref 96–112)
Creatinine, Ser: 0.84 mg/dL (ref 0.50–1.35)
GFR calc Af Amer: >60 mL/min (ref >60)
GFR calc non Af Amer: >60 mL/min (ref >60)
Glucose, Bld: 141 mg/dL — ABNORMAL HIGH (ref 70–99)
Potassium: 4.1 mEq/L (ref 3.5–5.1)
Sodium: 131 mEq/L — ABNORMAL LOW (ref 135–145)

## 2010-12-24 LAB — GLUCOSE, CAPILLARY
Glucose-Capillary: 140 mg/dL — ABNORMAL HIGH (ref 70–99)
Glucose-Capillary: 142 mg/dL — ABNORMAL HIGH (ref 70–99)
Glucose-Capillary: 168 mg/dL — ABNORMAL HIGH (ref 70–99)
Glucose-Capillary: 193 mg/dL — ABNORMAL HIGH (ref 70–99)

## 2010-12-25 LAB — CBC
HCT: 26.8 % — ABNORMAL LOW (ref 39.0–52.0)
Hemoglobin: 8.7 g/dL — ABNORMAL LOW (ref 13.0–17.0)
MCH: 27.7 pg (ref 26.0–34.0)
MCHC: 32.5 g/dL (ref 30.0–36.0)
MCV: 85.4 fL (ref 78.0–100.0)
Platelets: 261 10*3/uL (ref 150–400)
RBC: 3.14 MIL/uL — ABNORMAL LOW (ref 4.22–5.81)
RDW: 15.3 % (ref 11.5–15.5)
WBC: 18.8 10*3/uL — ABNORMAL HIGH (ref 4.0–10.5)

## 2010-12-25 LAB — GLUCOSE, CAPILLARY: Glucose-Capillary: 166 mg/dL — ABNORMAL HIGH (ref 70–99)

## 2010-12-25 LAB — CULTURE, RESPIRATORY W GRAM STAIN

## 2010-12-25 LAB — BASIC METABOLIC PANEL
BUN: 18 mg/dL (ref 6–23)
CO2: 37 mEq/L — ABNORMAL HIGH (ref 19–32)
Calcium: 9 mg/dL (ref 8.4–10.5)
Chloride: 86 mEq/L — ABNORMAL LOW (ref 96–112)
Creatinine, Ser: 0.88 mg/dL (ref 0.50–1.35)
GFR calc Af Amer: >60 mL/min (ref >60)
GFR calc non Af Amer: >60 mL/min (ref >60)
Glucose, Bld: 125 mg/dL — ABNORMAL HIGH (ref 70–99)
Potassium: 3 mEq/L — ABNORMAL LOW (ref 3.5–5.1)
Sodium: 131 mEq/L — ABNORMAL LOW (ref 135–145)

## 2010-12-26 DIAGNOSIS — I219 Acute myocardial infarction, unspecified: Secondary | ICD-10-CM

## 2010-12-26 HISTORY — DX: Acute myocardial infarction, unspecified: I21.9

## 2010-12-26 LAB — GLUCOSE, CAPILLARY
Glucose-Capillary: 236 mg/dL — ABNORMAL HIGH (ref 70–99)
Glucose-Capillary: 151 mg/dL — ABNORMAL HIGH (ref 70–99)

## 2010-12-27 DIAGNOSIS — IMO0001 Reserved for inherently not codable concepts without codable children: Secondary | ICD-10-CM

## 2010-12-27 DIAGNOSIS — E1165 Type 2 diabetes mellitus with hyperglycemia: Secondary | ICD-10-CM

## 2010-12-27 LAB — GLUCOSE, CAPILLARY
Glucose-Capillary: 189 mg/dL — ABNORMAL HIGH (ref 70–99)
Glucose-Capillary: 208 mg/dL — ABNORMAL HIGH (ref 70–99)

## 2010-12-27 NOTE — Cardiovascular Report (Signed)
  NAMEKEENAN, [name] NO.:  [id number]  MEDICAL RECORD NO.:  [id number]  LOCATION:  2906                         FACILITY:  [facility]  PHYSICIAN:  Rollene Rotunda, MD, FACCDATE OF BIRTH:  1950/09/19  DATE OF PROCEDURE:  12/20/2010 DATE OF DISCHARGE:                           CARDIAC CATHETERIZATION   PRIMARY CARE PHYSICIAN:  Kirstie Peri, MD  CARDIOLOGIST:  Learta Codding, MD, [facility]  PROCEDURE:  Left heart catheterization/coronary arteriography.  INDICATION:  Unstable angina.  PROCEDURE NOTE:  Left heart catheterization performed via the right radial artery, and the artery was cannulated using anterior wall puncture.  A #5-French arterial sheath was inserted via the modified Seldinger technique.  Preformed Judkins and pigtail catheter were utilized.  The patient tolerated the procedure well and left the lab in stable condition.  RESULTS:  Hemodynamics; LV 112/15, AO 105/72.  Coronaries; left main was normal.  The LAD had proximal luminal irregularities.  There was proximal long 80% stenosis.  The first diagonal was small with ostial 50% stenosis.  Second diagonal had proximal long 30% stenosis.  Third diagonal was small to moderate size with ostial 95% stenosis.  The circumflex in the AV groove had diffuse luminal irregularities.  There was mid obtuse marginal which was long with proximal 25% stenosis.  The right coronary artery was very large.  It was a dominant vessel.  It was long mid 80-90% stenosis followed by distal 99% lesion with possible ruptured plaque at the site of the PDA.  The PDA was large with luminal irregularities.  Posterolateral was large and normal.  Left ventriculogram; left ventriculogram was obtained in the RAO projection. The EF was about 55% with no regional wall motion abnormalities.  CONCLUSION:  Severe two-vessel coronary artery disease.  I have reviewed the film with Dr. [place].  This will be a difficult procedure given  the disease at the branch point in the right coronary.  He also has LAD disease.  The preferable treatment would be bypass surgery.  The patient understands this and we discussed this.  I have called Cardiothoracic Surgery.    Rollene Rotunda, MD, [facility]    JH/MEDQ  D:  12/20/2010  T:  12/21/2010  Job:  [id number]  cc:   Kirstie Peri, MD  Electronically Signed by Rollene Rotunda MD [facility] on 12/27/2010 02:29:07 PM

## 2010-12-28 DIAGNOSIS — E1165 Type 2 diabetes mellitus with hyperglycemia: Secondary | ICD-10-CM

## 2010-12-28 DIAGNOSIS — IMO0001 Reserved for inherently not codable concepts without codable children: Secondary | ICD-10-CM

## 2010-12-28 LAB — BASIC METABOLIC PANEL
BUN: 28 mg/dL — ABNORMAL HIGH (ref 6–23)
CO2: 37 mEq/L — ABNORMAL HIGH (ref 19–32)
Calcium: 9 mg/dL (ref 8.4–10.5)
Chloride: 85 mEq/L — ABNORMAL LOW (ref 96–112)
Creatinine, Ser: 1.01 mg/dL (ref 0.50–1.35)
GFR calc Af Amer: >60 mL/min (ref >60)
GFR calc non Af Amer: >60 mL/min (ref >60)
Glucose, Bld: 113 mg/dL — ABNORMAL HIGH (ref 70–99)
Potassium: 3.1 mEq/L — ABNORMAL LOW (ref 3.5–5.1)
Sodium: 131 mEq/L — ABNORMAL LOW (ref 135–145)

## 2010-12-28 LAB — GLUCOSE, CAPILLARY
Glucose-Capillary: 118 mg/dL — ABNORMAL HIGH (ref 70–99)
Glucose-Capillary: 104 mg/dL — ABNORMAL HIGH (ref 70–99)

## 2010-12-28 LAB — CBC
HCT: 30 % — ABNORMAL LOW (ref 39.0–52.0)
Hemoglobin: 9.7 g/dL — ABNORMAL LOW (ref 13.0–17.0)
MCH: 27.2 pg (ref 26.0–34.0)
MCHC: 32.3 g/dL (ref 30.0–36.0)
MCV: 84 fL (ref 78.0–100.0)
Platelets: 381 10*3/uL (ref 150–400)
RBC: 3.57 MIL/uL — ABNORMAL LOW (ref 4.22–5.81)
RDW: 14.9 % (ref 11.5–15.5)
WBC: 14.7 10*3/uL — ABNORMAL HIGH (ref 4.0–10.5)

## 2010-12-29 LAB — WOUND CULTURE
Culture: NO GROWTH
Gram Stain: NONE SEEN

## 2010-12-29 LAB — GLUCOSE, CAPILLARY

## 2011-01-01 ENCOUNTER — Observation Stay (HOSPITAL_COMMUNITY)
Admission: EM | Admit: 2011-01-01 | Discharge: 2011-01-03 | DRG: 313 | Disposition: A | Discharge status: Home / Self Care | Payer: PRIVATE HEALTH INSURANCE | Source: Other Acute Inpatient Hospital | Attending: Cardiology | Admitting: Cardiology

## 2011-01-01 DIAGNOSIS — E785 Hyperlipidemia, unspecified: Secondary | ICD-10-CM | POA: Insufficient documentation

## 2011-01-01 DIAGNOSIS — J189 Pneumonia, unspecified organism: Secondary | ICD-10-CM | POA: Insufficient documentation

## 2011-01-01 DIAGNOSIS — E119 Type 2 diabetes mellitus without complications: Secondary | ICD-10-CM | POA: Insufficient documentation

## 2011-01-01 DIAGNOSIS — I252 Old myocardial infarction: Secondary | ICD-10-CM | POA: Insufficient documentation

## 2011-01-01 DIAGNOSIS — R079 Chest pain, unspecified: Principal | ICD-10-CM | POA: Insufficient documentation

## 2011-01-01 DIAGNOSIS — I2510 Atherosclerotic heart disease of native coronary artery without angina pectoris: Secondary | ICD-10-CM | POA: Insufficient documentation

## 2011-01-01 DIAGNOSIS — Z951 Presence of aortocoronary bypass graft: Secondary | ICD-10-CM | POA: Insufficient documentation

## 2011-01-01 HISTORY — PX: CORONARY ARTERY BYPASS GRAFT: SHX141

## 2011-01-02 DIAGNOSIS — I200 Unstable angina: Secondary | ICD-10-CM

## 2011-01-02 LAB — GLUCOSE, CAPILLARY
Glucose-Capillary: 133 mg/dL — ABNORMAL HIGH (ref 70–99)
Glucose-Capillary: 64 mg/dL — ABNORMAL LOW (ref 70–99)
Glucose-Capillary: 84 mg/dL (ref 70–99)

## 2011-01-02 LAB — BASIC METABOLIC PANEL
GFR calc Af Amer: >60 mL/min (ref >60)
GFR calc non Af Amer: >60 mL/min (ref >60)
Potassium: 3.9 mEq/L (ref 3.5–5.1)
Sodium: 131 mEq/L — ABNORMAL LOW (ref 135–145)

## 2011-01-02 LAB — CARDIAC PANEL(CRET KIN+CKTOT+MB+TROPI)
CK, MB: 1.7 ng/mL (ref 0.3–4.0)
CK, MB: 1.7 ng/mL (ref 0.3–4.0)
Relative Index: INVALID (ref 0.0–2.5)
Total CK: 59 U/L (ref 7–232)
Troponin I: <0.3 ng/mL (ref <0.30)

## 2011-01-03 DIAGNOSIS — R079 Chest pain, unspecified: Secondary | ICD-10-CM

## 2011-01-03 LAB — CBC
HCT: 28.7 % — ABNORMAL LOW (ref 39.0–52.0)
Hemoglobin: 9.4 g/dL — ABNORMAL LOW (ref 13.0–17.0)
MCHC: 32.8 g/dL (ref 30.0–36.0)
MCV: 82.7 fL (ref 78.0–100.0)
RDW: 14.2 % (ref 11.5–15.5)

## 2011-01-03 LAB — BASIC METABOLIC PANEL
BUN: 22 mg/dL (ref 6–23)
Chloride: 94 mEq/L — ABNORMAL LOW (ref 96–112)
Creatinine, Ser: 1.17 mg/dL (ref 0.50–1.35)
GFR calc Af Amer: >60 mL/min (ref >60)
GFR calc non Af Amer: >60 mL/min (ref >60)
Glucose, Bld: 124 mg/dL — ABNORMAL HIGH (ref 70–99)

## 2011-01-06 NOTE — Discharge Summary (Signed)
  NAMEANIL, HAVARD NO.:  [id number]  MEDICAL RECORD NO.:  [id number]  LOCATION:  2004                         FACILITY:  [facility]  PHYSICIAN:  Kerin Perna, M.D.  DATE OF BIRTH:  [birth year]  DATE OF ADMISSION:  12/20/2010 DATE OF DISCHARGE:  12/27/2010                        DISCHARGE SUMMARY - REFERRING   ADDENDUM  BRIEF HOSPITAL COURSE STAY:  The patient has remained afebrile.  Heart rate in the 70s to 80s, BP 111/63, O2 sat 95% on room air.  Preop weight 89 kg, today's weight down to 82.4 kg.  CBGs 118-189 respectively. Laboratory studies done today revealed the sodium to be 131, potassium 3.1 which has been supplemented, BUN and creatinine 20 and 1.01 respectively.  CBC shows H and H to be 9.7 and 30, white count down to 14,700, platelet count 381,000.  Preliminary wound culture, no white blood cells or organisms seen.  PHYSICAL EXAMINATION:  CARDIOVASCULAR:  Regular rate and rhythm. PULMONARY:  Clear. ABDOMEN:  Soft, nontender.  Bowel sounds present. EXTREMITIES:  Trace lower extremity edema bilaterally. WOUNDS:  Slight serosanguineous ooze of the distal sternal incision.  No erythema, no other drainage noted.  Per discussion with Dr. Donata Clay, the patient had received 5 days of Maxipime for pulmonary reasons.  He was then started on vancomycin IV for the sternal drainage.  This will be discontinued today, and he will be given  Keflex 500 mg p.o. three times daily for 10 days.  In addition, as previously stated, the patient's white count is now down to 14,700 and he remains afebrile.  He was felt surgically stable for discharge to SNF, once a bed becomes available.  The only addition to the previously dictated medications are that of Keflex 500 mg p.o. three times daily x10 days; in addition to a NicoDerm patch which is to be administered as 21 mg/24 hour transdermally for the next 4 days, followed by a NicoDerm CQ 14 mg/24 hours transdermally  daily x7 days, followed by NicoDerm CQ 7 mg/24 hours transdermally daily for 7 days, then discontinue.     Doree Fudge, PA   ______________________________ Kerin Perna, M.D.    DZ/MEDQ  D:  12/28/2010  T:  12/28/2010  Job:  [id number]  cc:   Learta Codding, MD,FACC  Electronically Signed by Doree Fudge PA on 12/28/2010 01:23:46 PM Electronically Signed by Kerin Perna M.D. on 01/06/2011 02:05:24 PM

## 2011-01-06 NOTE — Discharge Summary (Signed)
NAMEHORST, OSTERMILLER NO.:  [id number]  MEDICAL RECORD NO.:  [id number]  LOCATION:  2017                         FACILITY:  [facility]  PHYSICIAN:  Kerin Perna, M.D.  DATE OF BIRTH:  1950/12/17  DATE OF ADMISSION:  12/20/2010 DATE OF DISCHARGE:                              DISCHARGE SUMMARY   HISTORY:  The patient is a 60 year old male with previous history of nonobstructive coronary artery disease on cardiac catheterization done in 2004.  Most recently, the patient was seen in consultation by Dr. Andee Lineman in November 2011 for chest pain in the context of cardiac risk factors, most notably diabetes mellitus.  He was referred for exercise stress echocardiogram which was negative for definite ischemia, although study was suboptimal at 83% of PMHR.  The patient presented to the emergency department at [facility] on December 20, 2010, where he was complaining of a severe anterior chest discomfort, 10/10 with radiation to the left upper extremity and associated diaphoresis, dyspnea, nausea and vomiting.  It began at approximately 6:00 a.m. and he was characterized as "someone is sitting on my chest."  He also has associated exertional dyspnea.  The patient presented with initial blood pressure of 212/117 and was treated with nitroglycerin intravenously as well as baby aspirin and started on heparin drip.  Initial CPK and MBs were 117 and 2.8 with troponin-I of 0.19 and a normal D-dimer.  There was no evidence of congestive heart failure on chest x-ray.  He continued to have pain.  An EKG showed sinus rhythm with nonspecific ST changes.  He was felt to have a non-ST-segment elevation myocardial infarction and was transferred to [facility] for further evaluation and treatment to include cardiac catheterization.  ALLERGIES:  CODEINE.  HOME MEDICATIONS: 1. Metformin. 2. Aspirin. 3. Cholesterol pill.  PAST MEDICAL HISTORY: 1. Diabetes mellitus. 2.  Dyslipidemia. 3. Tobacco abuse, longstanding. 4. Hypertension. 5. History of Bell palsy.  PAST SURGICAL HISTORY: 1. Cholecystectomy. 2. Bilateral shoulder surgery. 3. Left knee surgery 4. Foot surgery.  SOCIAL HISTORY:  The patient continues to smoke approximately a half-a- pack a day starting at age 80.  He quit drinking alcohol approximately 20 years ago.  Family history, review of symptoms and physical exam, please see the history and physical done at the time of admission.  HOSPITAL COURSE:  The patient was admitted for cardiac catheterization entrance and transfer.  This was done by Dr. Antoine Poche and demonstrated a 99% stenosis of the distal right coronary at the PD, PL bifurcation as well as an 80% stenosis of the proximal LAD.  Ejection fraction was well preserved and left ventricular end-diastolic pressure was normal at 12 mmHg.  There was no evidence of aortic stenosis or mitral regurgitation. He was felt to be a suitable candidate for surgical revascularization in consultation with Kathlee Nations Trigt MD.  PROCEDURE:  On December 21, 2010, he was taken to the cardiac operating room where he underwent coronary artery bypass grafting x3.  The following grafts were placed. 1. Left internal mammary artery to the LAD. 2. Saphenous vein graft to the posterior descending. 3. Saphenous vein graft to the posterolateral branch of the right  coronary artery.  The patient tolerated procedure well, was taken     to the surgical intensive care unit in stable condition.  POSTOPERATIVE HOSPITAL COURSE:  The patient has remained neurologically intact.  He has remained hemodynamically stable.  He has had no significant cardiac dysrhythmias.  He has had a postoperative probable bronchopneumonia and currently has been treated with inhalers as well as intravenous Maxipime.  Most recent white blood cell count on December 24, 2010, was 21.9.  He does have acute blood loss anemia with most  recent hemoglobin and hematocrit of 8.7 and 26.7.  His diabetes is under adequate control.  He does continue to be on some oxygen, currently with saturations 93% on 2 liters.  His incisions are healing well without evidence of infection.  He is tolerating gradual increase in activities using standard protocols.  He is being treated with aggressive pulmonary toilet, coughing and deep breathing as well as incentive spirometry. Overall, his status is felt to be tentatively stable for discharge in the approximately next 1-2 days pending further clinical course and resolution of his pulmonary symptoms.  At the time of this dictation his medications on discharge include the following: 1. Aspirin 325 mg p.o. daily. 2. Advair 250/50 inhaler twice daily, 1 puff twice daily. 3. Lasix 40 mg daily for an additional 7 days. 4. Guaifenesin 600 mg twice daily. 5. Iron complex 150 mg daily. 6. Lopressor 25 mg twice daily. 7. Oxycodone 5 mg one-two every 4-6 hours as needed. 8. Potassium chloride 20 mEq daily for an additional 7 days. 9. Crestor 40 mg daily. 10.Docusate 100 mg daily. 11.Gemfibrozil 600 mg twice daily. 12.Amaryl 4 mg twice daily. 13.Metformin 500 mg 2 tablets twice daily. 14.One-a-day vitamin men's formula 1 tablet daily. 15.Protonix 40 mg daily.  He will probably require oral antibiotic at the time of discharge and this will be determined in an addendum to this summary.  INSTRUCTIONS:  The patient received written instructions regarding medications, activity, diet, wound care and followup.  FOLLOWUP:  Dr. Andee Lineman in 2 weeks post discharge.  Dr. Kathlee Nations Trigt's office will see the patient on Monday, July 16 at 1:45 p.m. with a chest x-ray at that time.  CONDITION ON DISCHARGE:  Stable, improving.  FINAL DIAGNOSIS:  Severe coronary artery disease in the setting of a non- ST-segment elevation myocardial infarction.  OTHER DIAGNOSES: 1. Diabetes mellitus. 2.  Dyslipidemia. 3. Longstanding tobacco abuse. 4. Hypertension. 5. History of Bell palsy. 6. Postoperative volume overload. 7. Postoperative acute blood loss anemia. 8. Postoperative bronchial pneumonia.     Rowe Clack, P.A.-C.   ______________________________ Kerin Perna, M.D.    Sherryll Burger  D:  12/24/2010  T:  12/25/2010  Job:  [id number]  cc:   Learta Codding, MD,FACC Kirstie Peri, MD  Electronically Signed by Gershon Crane P.A.-C. on 01/04/2011 02:23:08 PM Electronically Signed by Kerin Perna M.D. on 01/06/2011 02:05:48 PM

## 2011-01-06 NOTE — Discharge Summary (Signed)
  [name], [name] NO.:  [id number]  MEDICAL RECORD NO.:  [id number]  LOCATION:  2004                         FACILITY:  [facility]  PHYSICIAN:  Kerin Perna, M.D.  DATE OF BIRTH:  October 09, 1950  DATE OF ADMISSION:  12/20/2010 DATE OF DISCHARGE:  12/27/2010                              DISCHARGE SUMMARY   ADDENDUM  The patient remained afebrile and hemodynamically stable.  His systolic blood pressure did begin increasing into the 130s.  As a result, he was started on low-dose lisinopril 5 mg p.o. daily.  His diabetes remained under good control with his regimen of Amaryl and Metformin.  He did then began experiencing hallucinations. Initially, his oxycodone was stopped. He continued with hallucinations so his Fentanyl patch was then discontinued.  He had no more hallucinations.  He will be given Ultram p.o. p.r.n. pain upon discharge.  His final respiratory culture did indicate gram-positive cocci.  He was continued on Maxipime for a total course of 5 days. He did have constipation and although he is passing flatus, he has not had a bowel movement yet.  He has been given numerous laxatives and hopefully will have the BM by the a.m.  Provided the patient remains afebrile,  hemodynamically stable, and pending morning round of evaluation, he will be surgically stable for discharge on December 27, 2010.  Latest vital signs are as follows:  He is afebrile, heart rate is 70s to 80s, BP 131/74, O2 sat was 100% initially on 2 liters of oxygen last evening, however, he is going to placed to room air.  His preop weight is 89 kg, today's weight is down to 86.3 kg.  CBGs 104, 176, and 125 ounces.  PHYSICAL EXAMINATION:  CARDIOVASCULAR:  Regular rate and rhythm. PULMONARY:  Slight decreased at the bases. ABDOMEN:  Soft, nontender.  Bowel sounds present. EXTREMITIES:  Trace lower extremity edemas, minor serosanguineous ooze from the distal sternal wound.  No  erythema.  DISCHARGE INSTRUCTIONS:  As previously dictated, followup appointment as previously dictated, in addition to the patient need to contact Dr. Kirstie Peri, for further diabetes, management, and discharge medications as previously dictated.     Doree Fudge, PA   ______________________________ Kerin Perna, M.D.    DZ/MEDQ  D:  12/26/2010  T:  12/27/2010  Job:  [id number]  cc:   Rollene Rotunda, MD, [facility] Kirstie Peri, MD Kerin Perna, M.D.  Electronically Signed by Doree Fudge PA on 01/06/2011 08:40:36 AM Electronically Signed by Kerin Perna M.D. on 01/06/2011 02:05:40 PM

## 2011-01-08 LAB — CULTURE, BLOOD (ROUTINE X 2)
Culture: NO GROWTH
Culture: NO GROWTH

## 2011-01-21 ENCOUNTER — Other Ambulatory Visit: Payer: Self-pay | Admitting: Cardiothoracic Surgery

## 2011-01-21 DIAGNOSIS — I2510 Atherosclerotic heart disease of native coronary artery without angina pectoris: Secondary | ICD-10-CM

## 2011-01-24 ENCOUNTER — Ambulatory Visit
Admission: RE | Admit: 2011-01-24 | Discharge: 2011-01-24 | Disposition: A | Discharge status: Home / Self Care | Payer: PRIVATE HEALTH INSURANCE | Source: Ambulatory Visit | Attending: Cardiothoracic Surgery | Admitting: Cardiothoracic Surgery

## 2011-01-24 ENCOUNTER — Encounter (INDEPENDENT_AMBULATORY_CARE_PROVIDER_SITE_OTHER): Payer: Self-pay

## 2011-01-24 DIAGNOSIS — I2510 Atherosclerotic heart disease of native coronary artery without angina pectoris: Secondary | ICD-10-CM

## 2011-01-24 NOTE — Discharge Summary (Signed)
[name], RICHTER NO.:  [id number]  MEDICAL RECORD NO.:  [id number]  LOCATION:  3739                         FACILITY:  [facility]  PHYSICIAN:  Cassell Clement, M.D. DATE OF BIRTH:  04-08-1951  DATE OF ADMISSION:  01/01/2011 DATE OF DISCHARGE:  01/03/2011                              DISCHARGE SUMMARY   PRIMARY CARDIOLOGIST:  Learta Codding, MD,FACC in [place].  PRIMARY CARE PROVIDER:  Kirstie Peri, MD  CARDIOTHORACIC SURGEON:  Kerin Perna, MD  DISCHARGE DIAGNOSES:  Chest pain without objective evidence ischemia.  SECONDARY DIAGNOSES: 1. Coronary artery disease status post non-ST segment elevation     myocardial infarction June 2012 with subsequent coronary bypass     grafting x3 December 23, 2010. 2. Hyperlipidemia. 3. Diabetes mellitus. 4. Postoperative pneumonia with ongoing antibiotics. 5. Status post bilateral shoulder surgery. 6. Status post appendectomy. 7. Status post cholecystectomy. 8. Status post tonsillectomy.  ALLERGIES:  The patient has GI intolerance to CODEINE.  PROCEDURES:  None.  HISTORY OF PRESENT ILLNESS:  A 60 year old male with recent non-ST- segment elevation myocardial infarction in June 2012 with subsequent coronary bypass grafting x3.  Postoperative course complicated by pneumonia for which the patient has been treated with Keflex.  The patient was discharged to rehab on July 3, however, on July 7, the patient developed sharp chest pain in the area of his sternal wound radiating to his neck and left arm.  This was different from prior MI symptoms.  He presented to [facility] where CT angio of the chest was performed and was negative.  He was noted to have an elevated troponin of 0.11 and decision was made to transfer the patient to [facility] for further evaluation.  HOSPITAL COURSE:  Here the patient's cardiac enzymes have been negative. It is felt that his chest pain most likely represented sternal wound pain.  His  wound was evaluated by CT surgery and felt to be stable with recommendation for continuation of Keflex.  The patient is tolerating activity well and will be discharged home today in good condition.  DISCHARGE LABS:  Hemoglobin 9.4, hematocrit 28.7, WBC 10.0, platelets 474,000.  Sodium 132, potassium 3.9, chloride 94, CO2 30, BUN 22, creatinine 1.17, glucose 124, calcium 9.0.  CK 48, MB 1.7, troponin I less than 0.030.  MRSA screen was negative.  Blood cultures negative x2.  DISPOSITION:  The patient will be discharged today in good condition.  FOLLOWUP PLANS AND APPOINTMENTS:  The patient will follow up with Dr. Kirstie Peri in approximately 1-2 weeks.  He is to follow up with Dr. Simona Huh in our [place] office on August 27 at 2:20 p.m.  DISCHARGE MEDICATIONS: 1. Ibuprofen 200 mg 2-3 tabs q.4-6 h. p.r.n. chest wall pain. 2. Nitroglycerin 0.4 mg p.r.n. chest pain. 3. Aspirin 81 mg daily. 4. Glimepiride 4 mg daily. 5. Calcium carbonate 500 mg b.i.d. 6. Docusate 100 mg daily. 7. Advair 250/50, 1 puff b.i.d.8. Gemfibrozil 600 mg b.i.d. 9. Guaifenesin 600 mg b.i.d. 10.Iron complex 150 mg daily. 11.Keflex 500 mg x4 additional days. 12.Lisinopril 5 mg daily. 13.Metformin 1000 mg b.i.d. 14.Men's One-A -Day vitamin daily. 15.Metoprolol 25 mg b.i.d. 16.Nicotine 21 mg  transdermally daily. 17.Protonix 40 mg daily. 18.Rosuvastatin 10 mg at bedtime. 19.Tramadol 50 mg 1-2 tablets q.4 h. p.r.n. 20.Vitamin D3 1000 units daily.  OUTSTANDING LABS AND STUDIES:  None.  DURATION OF DISCHARGE ENCOUNTER:  35 minutes including physician time.     [name], ANP   ______________________________ Cassell Clement, M.D.    CB/MEDQ  D:  01/03/2011  T:  01/03/2011  Job:  [id number]  cc:   Kirstie Peri, MD Kerin Perna, M.D.  Electronically Signed by [name] ANP on 01/11/2011 04:47:17 PM Electronically Signed by Cassell Clement M.D. on 01/24/2011 12:41:07 PM

## 2011-01-25 NOTE — Assessment & Plan Note (Signed)
OFFICE VISIT  [name], [name] DOB:  Mar 01, 1951                                        January 24, 2011 CHART #:  [id number]  REASON FOR OFFICE VISIT:  Routine followup CABG.  HISTORY OF PRESENTING ILLNESS:  This is a 60 year old Caucasian male with a past medical history of hyperlipidemia, diabetes mellitus, and tobacco abuse who presented with a non-STEMI in June.  He underwent a CABG x3 (LIMA to LAD, SVG to posterior descending coronary and SVG to posterolateral branch with EVH from the right thigh by Dr. Donata Clay on December 21, 2010.  The patient had a fairly uneventful hospital course stay and was discharged to a facility in stable condition on December 29, 2010. After having been at the facility for a couple of days, however, he began to experience chest pain.  According to medical records, this was different from his prior and MI symptoms in that the pain radiated to his left neck and left arm.  He was initially seen and evaluated at [facility] where a CT angio of the chest was apparently negative. Hopefully, this pain is related to the sternotomy incision and again is not cardiac in nature.  He was found to have an elevated troponin of 0.11.  He was transferred to [facility] for further evaluation and treatment on January 01, 2011.  He was seen and evaluated by Dr. Patty Sermons. His cardiac enzymes remain negative and it was felt that his chest pain was most likely secondary to his sternotomy.  He was discharged in stable condition on January 03, 2011.  The patient presents today for routine followup.  His only complaint includes chest pain that he states is similar to the pain he experienced when he had his MI.  Again, he states that the pain radiates into his left neck, but mostly in his left shoulder area.  He denies any diaphoresis, syncope, palpitations or nausea.  PHYSICAL EXAMINATION:  VITAL SIGNS:  BP 136/85, heart rate 102, respirations 20, O2 sat 98%  on room air. CARDIOVASCULAR:  Regular rate and rhythm.  S1, S2 without murmurs, gallops, or rubs. PULMONARY:  Clear auscultation bilaterally.  No rales, wheezes, or rhonchi. ABDOMEN:  Soft, nontender.  Bowel sounds present. EXTREMITIES:  No cyanosis, clubbing, or edema.  Right lower extremity wound clean, dry, well healed.  Sternum solid.  Wound is clean and dry. No drainage.  Chest x-ray done today shows no pneumothorax.  No acute findings.  IMPRESSION AND PLAN:  I have discussed the preceding symptomatology of the patient's chest pain with pain radiation to the left neck and arm with Dr. Diona Browner.  I am unable to obtain an EKG at our office at this time.  Dr. Diona Browner is going to arrange for the patient to be seen by their office on Thursday, January 27, 2011.  The patient was instructed if he has any more chest pain that he is to present to the ER.  He was instructed not to take any nitroglycerin unless he was instructed to do so.  The wife states that the patient was never given prescriptions for medications.  As a result, I have given him prescriptions for the following:  Lopressor 25 mg p.o. 2 times daily, lisinopril 5 mg p.o. daily, Crestor 40 mm p.o. at bedtime, and Ultram 50 mg one p.o. q. 4-6  hours p.o. p.r.n. pain.  He was given a prescription refills for everything with the exception of the Ultram. 1. Once he has seen a cardiologist this week, he may begin driving     short distances during the day provided he is not taking any     narcotics.  If he tolerates this, he may gradually increase his     frequency and duration as tolerates.  Regarding the patient's     participation in cardiac rehab, he was instructed not to do so and     he was also instructed not to do any heavy physical activity and to     cleared so by Cardiology. 2. The patient on 71 states he has been somewhat anxious of late and     has begun smoking.  I had a long discussion with him regarding      importance of smoking cessation.  His insurance does not cover the     patch.  He might be interested in Chantix.  He does have an     appointment to see Dr. Clelia Croft who manages his diabetes and is going     to inquire whether he can give him a prescription for this.  The     patient will return to see Dr. Donata Clay in 3-4 weeks.  The patient     was instructed to call the office for any questions, problems or     concerns in the interim.  Doree Fudge, PA  DZ/MEDQ  D:  01/24/2011  T:  01/25/2011  Job:  [id number]  cc:   Kerin Perna, M.D. Dr. Ebony Hail, MD Learta Codding, MD,FACC

## 2011-01-26 ENCOUNTER — Encounter: Payer: Self-pay | Admitting: *Deleted

## 2011-01-27 ENCOUNTER — Ambulatory Visit (INDEPENDENT_AMBULATORY_CARE_PROVIDER_SITE_OTHER): Payer: Medicare Other | Admitting: Cardiology

## 2011-01-27 ENCOUNTER — Encounter: Payer: Self-pay | Admitting: Cardiology

## 2011-01-27 VITALS — BP 126/77 | HR 81 | Ht 68.0 in | Wt 187.0 lb

## 2011-01-27 DIAGNOSIS — J449 Chronic obstructive pulmonary disease, unspecified: Secondary | ICD-10-CM

## 2011-01-27 DIAGNOSIS — I10 Essential (primary) hypertension: Secondary | ICD-10-CM | POA: Insufficient documentation

## 2011-01-27 DIAGNOSIS — Z951 Presence of aortocoronary bypass graft: Secondary | ICD-10-CM

## 2011-01-27 DIAGNOSIS — E119 Type 2 diabetes mellitus without complications: Secondary | ICD-10-CM | POA: Insufficient documentation

## 2011-01-27 DIAGNOSIS — E785 Hyperlipidemia, unspecified: Secondary | ICD-10-CM

## 2011-01-27 DIAGNOSIS — I214 Non-ST elevation (NSTEMI) myocardial infarction: Secondary | ICD-10-CM

## 2011-01-27 DIAGNOSIS — I2510 Atherosclerotic heart disease of native coronary artery without angina pectoris: Secondary | ICD-10-CM

## 2011-01-27 NOTE — Assessment & Plan Note (Signed)
Brief admission for recurrent chest pain but no evidence of ischemia. Echocardiogram today shows normal LV function with an area of inferior hypokinesis. Patient's also sternotomy pain but reports no angina.

## 2011-01-27 NOTE — Progress Notes (Signed)
HPI The patient is a 60 year old with a history of non-ST elevation myocardial infarction July 2012. The patient was found of multivessel coronary artery disease and underwent coronary bypass grafting with LIMA to the LAD, saphenous vein graft to the RCA and saphenous vein graft to the PDA. He also has a history of diabetes mellitus, hypertension COPD and dyslipidemia. Initially after discharge the patient was sent to [facility]. While he was there he developed some chest pain and was referred to the emergency room and subsequently back to [facility]. The patient ruled out for myocardial infarction and no further ischemia testing was performed. The patient's wife reports to me that unfortunately he was not giving any pain medications or other cardiac medications in the hospital discharge. He did see the physician's assistant from Dr. Morton Peters we'll place him back on all his medications. He had a hemoglobin A1c done which was 6.2. He monitors his blood sugars. His primary care physician placed him on Wellbutrin for smoking cessation. He still smokes a pack a day. The patient states that he has no recurrent chest pain and sternotomy pain is well controlled with Ultram. He takes his as needed. The patient stated he is regaining strength. He will need a prescription for cardiac rehabilitation. I performed a limited bedside echocardiogram. The patient has some inferior hypokinesis. His ejection fraction however 60-65%. There is no pericardial effusion. Aortic, mitral tricuspid valve all appear within normal limits with no significant regurgitation.  Allergies  Allergen Reactions  . Codeine Other (See Comments)    GI     Current Outpatient Prescriptions on File Prior to Visit  Medication Sig Dispense Refill  . diclofenac (VOLTAREN) 75 MG EC tablet Take 75 mg by mouth 2 (two) times daily.        Marland Kitchen docusate sodium (COLACE) 100 MG capsule Take 200 mg by mouth at bedtime.       Marland Kitchen gemfibrozil  (LOPID) 600 MG tablet Take 600 mg by mouth 2 (two) times daily before a meal.        . glimepiride (AMARYL) 4 MG tablet Take 4 mg by mouth 2 (two) times daily.        Marland Kitchen lisinopril (PRINIVIL,ZESTRIL) 5 MG tablet Take 5 mg by mouth daily.        . metFORMIN (GLUCOPHAGE) 500 MG tablet Take 1,000 mg by mouth 2 (two) times daily with a meal.        . metoprolol tartrate (LOPRESSOR) 25 MG tablet Take 25 mg by mouth 2 (two) times daily.        . Multiple Vitamin (MULTIVITAMIN) tablet Take 1 tablet by mouth daily.        . pantoprazole (PROTONIX) 40 MG tablet Take 40 mg by mouth daily.        . rosuvastatin (CRESTOR) 40 MG tablet Take 40 mg by mouth daily.        . traMADol (ULTRAM) 50 MG tablet Take 50 mg by mouth every 4 (four) hours as needed.         Past Medical History  Diagnosis Date  . Hypertension   . Diabetes mellitus   . COPD (chronic obstructive pulmonary disease)   . Dyslipidemia   . Coronary artery disease     s/p recent non-ST elevation MI. Had three vessel bypass grafting with a LIMA to LAD,saphenous vein graft to his RCA,and saphenous vein graft to his PDA    Past Surgical History  Procedure Date  . Shoulder surgery  bilateral shoulder surgeries  . Coronary artery bypass graft     X'S 3  . Appendectomy   . Cholecystectomy   . Tonsillectomy     Family History  Problem Relation Age of Onset  . Diabetes      family history of  . Hypertension      family history of  . Other      negative for premature coronary disease and coronary bypass surgery    History   Social History  . Marital Status: Married    Spouse Name: N/A    Number of Children: N/A  . Years of Education: N/A   Occupational History  . Not on file.   Social History Main Topics  . Smoking status: Current Everyday Smoker -- 1.0 packs/day for 50 years    Types: Cigarettes  . Smokeless tobacco: Never Used  . Alcohol Use: Yes     drinks alcohol occasionally  . Drug Use: Not on file  .  Sexually Active: Not on file   Other Topics Concern  . 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 The remainder of the 18  point review of systems is negative  PHYSICAL EXAM BP 126/77  Pulse 81  Ht 5\' 8"  (1.727 m)  Wt 187 lb (84.823 kg)  BMI 28.43 kg/m2  General: Well-developed, well-nourished in no distress Head: Normocephalic and atraumatic Eyes:PERRLA/EOMI intact, conjunctiva and lids normal Ears: No deformity or lesions Mouth:normal dentition, normal posterior pharynx Neck: Supple, no JVD.  No masses, thyromegaly or abnormal cervical nodes Lungs: Normal breath sounds bilaterally without wheezing.  Normal percussion Cardiac: regular rate and rhythm with normal S1 and S2, no S3 or S4.  PMI is normal.  No pathological murmurs Abdomen: Normal bowel sounds, abdomen is soft and nontender without masses, organomegaly or hernias noted.  No hepatosplenomegaly MSK: Back normal, normal gait muscle strength and tone normal Vascular: Pulse is normal in all 4 extremities Extremities: No peripheral pitting edema Neurologic: Alert and oriented x 3 Skin: Intact without lesions or rashes Lymphatics: No significant adenopathy Psychologic: Normal affect   ECG: Normal sinus rhythm, incomplete right bundle branch block  Limited bedside echocardiogram: Normal LV size and contraction. Ejection fraction 60-65%. Inferior wall hypokinesis. No significant valvular abnormalities. No pericardial effusion  ASSESSMENT AND PLAN

## 2011-01-27 NOTE — Patient Instructions (Signed)
   Cardiac rehab - will send referral  Your physician wants you to follow up in: 6 months.  You will receive a reminder letter in the mail one-two months in advance.  If you don't receive a letter, please call our office to schedule the follow up appointment

## 2011-01-27 NOTE — Assessment & Plan Note (Addendum)
Mild sternotomy pain well-controlled with Ultram. The patient will have followup visit with cardiac surgery. I've written an order for him to be enrolled in cardiac rehabilitation program

## 2011-01-27 NOTE — Assessment & Plan Note (Signed)
Followed by his primary care physician. 

## 2011-01-27 NOTE — Assessment & Plan Note (Signed)
Lipid-lowering therapy 

## 2011-01-31 ENCOUNTER — Other Ambulatory Visit: Payer: Self-pay | Admitting: *Deleted

## 2011-01-31 DIAGNOSIS — Z951 Presence of aortocoronary bypass graft: Secondary | ICD-10-CM

## 2011-01-31 DIAGNOSIS — I2510 Atherosclerotic heart disease of native coronary artery without angina pectoris: Secondary | ICD-10-CM

## 2011-02-10 NOTE — H&P (Signed)
[name], [name] NO.:  [id number]  MEDICAL RECORD NO.:  [id number]  LOCATION:  2926                         FACILITY:  [facility]  PHYSICIAN:  Natasha Bence, MD       DATE OF BIRTH:  Apr 16, 1951  DATE OF ADMISSION:  01/01/2011 DATE OF DISCHARGE:                             HISTORY & PHYSICAL   CHIEF COMPLAINT:  Chest pain.  HISTORY OF PRESENT ILLNESS:  [name] is a 60 year old white male with a history of recent non-ST elevation MI last week who subsequently underwent a three-vessel bypass here.  He was discharged on the December 28, 2010, for rehab for rehab.  He presented to [facility] today with complaints of chest discomfort.  He reports this was located right around the area of sternal wound.  It did radiate somewhat into his neck into his left arm he said.  He describes the pain as a very sharp stabbing pain and chest heaviness.  He said he did have difficulty catching his breath.  He was also nauseated and had some diaphoresis. He has been experiencing different pain that he developed from the sternotomy, thought it was worsened with movement and seemed to be relieved with certain positions; however, this pain tonight was different and similar to the pain with his recent non-ST elevation MI. He reports the pain is still somewhat present.  He says lasted over 2 hours without adequate relief, however, has improved somewhat. Currently, he is not short of breath.  He has not had any PND, orthopnea.  He denies any palpitations, lightheadedness, or dizziness. In the emergency room there, troponin was elevated at 0.11, D-dimer was also elevated, so the physician obtained a CT angiogram with PE protocol, which was negative.  [name] hospital course was also complicated with what sounds like pneumonia postoperatively.  He has been on antibiotics for this.  He denies any frank fevers or chills, but he has had some sweats.  He denies any bleeding.   Otherwise, he has generalized complaints of headaches, nausea, and some mild abdominal pain.  Otherwise, review of systems was within normal limits.  PAST MEDICAL HISTORY:  Coronary artery disease status post recent non-ST elevation MI.  He also had three-vessel bypass grafting with a LIMA to his LAD, saphenous vein graft to his RCA, and a saphenous vein graft to his PDA, also has history of diabetes, hypertension, COPD, and dyslipidemia.  PAST SURGICAL HISTORY: 1. CABG x3. 2. Bilateral shoulder surgeries. 3. Appendectomy. 4. Cholecystectomy. 5. Tonsillectomy.  FAMILY HISTORY:  Positive for hypertension and diabetes.  No coronary artery disease.  SOCIAL HISTORY:  Former heavy smoker, just quit last week, previously smoked one-pack per day.  Drinks occasional alcohol.  ALLERGIES:  He has GI intolerance to CODEINE.  MEDICATIONS: 1. Iron 150 mg daily. 2. Aspirin 325 mg daily. 3. Crestor 10 mg p.o. at bedtime. 4. Advair 250/50 mcg inhaled b.i.d. 5. Metoprolol 25 mg p.o. b.i.d. 6. Metformin 1000 mg p.o. b.i.d. 7. Keflex 500 mg p.o. t.i.d. 8. Tramadol 50 mg p.o. q.6 h. P.r.n. 9. Gemfibrozil 600 mg p.o. b.i.d. 10.Amaryl 4 mg p.o. b.i.d. 11.Lisinopril 5 mg p.o. daily. 12.Nicoderm Patch. 13.Multivitamin daily.  14.Colace daily. 15.Protonix 40 mg p.o. daily. 16.Os-Cal 1 tablet p.o. b.i.d. 17.Vitamin D 1000 units p.o. daily. 18.Mucinex 600 mg p.o. b.i.d.  PHYSICAL EXAMINATION:  VITAL SIGNS:  He is afebrile.  Blood pressure is 112/66, heart rate 94, respiratory rate 14, O2 sats 98% on room. GENERAL:  He is a well-developed white male in no apparent distress. EYES:  Anicteric sclerae. NECK:  Normal jugular venous pressure.  No carotid bruits. LUNGS:  Clear to auscultation bilaterally. CARDIOVASCULAR:  Regular rate and rhythm.  No murmurs, rubs, or gallops. ABDOMEN:  Soft, nontender, nondistended.  No ascites. EXTREMITIES:  Warm with no edema. CHEST:  He has well-healing  sternotomy wound with no evidence of infection. PSYCHIATRIC:  He has very flat affect.  LABORATORY DATA:  Sodium 130, potassium 4.0, chloride 92, bicarb of 27, BUN of 20, creatinine of 0.96, glucose 103, calcium 9.1.  White count of 11.8, hematocrit 30.1, platelet count of 586.  D-dimer was 2.93, troponin was 0.11.  CK-MB was normal.  Chest x-ray at the outside hospital was within normal limits.  EKG showed sinus mechanism at the rate of 83 beats per minute with a right bundle-branch block.  EKG here shows sinus tachycardia with PACs.  Rate is 100 beats per minute.  He has a right bundle-branch block.  He also had a CT of his chest performed at the outside hospital, which showed no evidence of pulmonary embolism.  IMPRESSION AND PLAN:  This is a 60 year old white male who had a recent non-ST elevation myocardial infarction and underwent bypass grafting x3, was discharged just 4 days ago from this, now complains of chest discomfort.  He describes two different types of chest discomfort one of which clearly sounds like it is related to his sternotomy.  The other one is more concerning and has some features of angina similar to hisprevious episode prior to his bypass.  However, anytime he moves his chest, he complains of a sharp pain and says it was consistent with the pain he has experienced this evening, so I am not quite sure what to make of his chest discomfort at this point time.  His EKG shows no acute ischemic changes.  His troponin was mildly elevated at 0.11 at the outside hospital; however, this has significantly decreased from his recent non-ST elevation myocardial infarction values also this CK and CK- MB were normal, which suggest that this might be residual troponin elevation from his previous episode.  We will admit him and monitor on telemetry with serial cardiac enzymes.  He has only received aspirin at the outside hospital.  He had minimal relief with  sublingual nitroglycerin.  We will continue to monitor him.  We will continue his current doses of metoprolol, lisinopril, and Crestor in addition to his aspirin.  For his diabetes, we will place him on sliding scale insulin and metformin.  We will restart his Amaryl on discharge.  He had a recent infection postoperatively including pneumonia and possibly a sternal infection.  His sternal wound site looks good.  We will continue his current Keflex dose as is.          ______________________________ Natasha Bence, MD     MH/MEDQ  D:  01/02/2011  T:  01/02/2011  Job:  [id number]  Electronically Signed by Natasha Bence MD on 02/10/2011 08:22:13 PM

## 2011-02-21 ENCOUNTER — Encounter: Payer: PRIVATE HEALTH INSURANCE | Admitting: Cardiology

## 2011-02-21 DIAGNOSIS — I2510 Atherosclerotic heart disease of native coronary artery without angina pectoris: Secondary | ICD-10-CM

## 2011-02-23 ENCOUNTER — Encounter: Payer: Self-pay | Admitting: Cardiothoracic Surgery

## 2011-02-23 ENCOUNTER — Ambulatory Visit (INDEPENDENT_AMBULATORY_CARE_PROVIDER_SITE_OTHER): Payer: Self-pay | Admitting: Cardiothoracic Surgery

## 2011-02-23 VITALS — BP 138/82 | HR 72 | Resp 16 | Ht 66.0 in | Wt 190.0 lb

## 2011-02-23 DIAGNOSIS — Z951 Presence of aortocoronary bypass graft: Secondary | ICD-10-CM

## 2011-02-23 DIAGNOSIS — G8912 Acute post-thoracotomy pain: Secondary | ICD-10-CM

## 2011-02-23 DIAGNOSIS — E119 Type 2 diabetes mellitus without complications: Secondary | ICD-10-CM

## 2011-02-23 DIAGNOSIS — G8918 Other acute postprocedural pain: Secondary | ICD-10-CM

## 2011-02-23 NOTE — Progress Notes (Signed)
PCP is Kirstie Peri, MD Referring Provider is de Karma Lew, MD  Chief Complaint  Patient presents with  . Routine Post Op    4 WEEK FU FROM CABGX3  ON06/26/2012    HPI  BP 138/82  Pulse 72  Resp 16  Ht 5\' 6"  (1.676 m)  Wt 190 lb (86.183 kg)  BMI 30.67 kg/m2  SpO2 99%  The patient is a 60 year old Caucasian male diabetic ex-smoker returns for a 2 month followup after multivessel bypass grafting for unstable angina. He is doing well at home and is participating outpatient rehabilitation at [facility]. He still has  musculoskeletal chest wall pain but no angina. Surgical incisions are well-healed. He returns now for spinal office visit.  Past Medical History  Diagnosis Date  . Hypertension   . Diabetes mellitus   . COPD (chronic obstructive pulmonary disease)   . Dyslipidemia   . Coronary artery disease     s/p recent non-ST elevation MI. Had three vessel bypass grafting with a LIMA to LAD,saphenous vein graft to his RCA,and saphenous vein graft to his PDA    Past Surgical History  Procedure Date  . Shoulder surgery     bilateral shoulder surgeries  . Appendectomy   . Cholecystectomy   . Tonsillectomy   . Coronary artery bypass graft 01/01/2011    [facility]    Family History  Problem Relation Age of Onset  . Diabetes      family history of  . Hypertension      family history of  . Other      negative for premature coronary disease and coronary bypass surgery    Social History History  Substance Use Topics  . Smoking status: Former Smoker -- 1.0 packs/day for 50 years    Types: Cigarettes    Quit date: 02/02/2011  . Smokeless tobacco: Never Used  . Alcohol Use: No     drinks alcohol occasionally    Current Outpatient Prescriptions  Medication Sig Dispense Refill  . aspirin 81 MG EC tablet Take 81 mg by mouth daily.        . diclofenac (VOLTAREN) 75 MG EC tablet Take 75 mg by mouth 2 (two) times daily.        Marland Kitchen docusate sodium (COLACE) 100 MG  capsule Take 200 mg by mouth at bedtime.       Marland Kitchen gemfibrozil (LOPID) 600 MG tablet Take 600 mg by mouth 2 (two) times daily before a meal.        . glimepiride (AMARYL) 4 MG tablet Take 4 mg by mouth 2 (two) times daily.        Marland Kitchen lisinopril (PRINIVIL,ZESTRIL) 5 MG tablet Take 5 mg by mouth daily.        . metFORMIN (GLUCOPHAGE) 500 MG tablet Take 1,000 mg by mouth 2 (two) times daily with a meal.        . metoprolol tartrate (LOPRESSOR) 25 MG tablet Take 25 mg by mouth 2 (two) times daily.        . Multiple Vitamin (MULTIVITAMIN) tablet Take 1 tablet by mouth daily.        . nitroGLYCERIN (NITROSTAT) 0.4 MG SL tablet Place 0.4 mg under the tongue every 5 (five) minutes as needed.        . pantoprazole (PROTONIX) 40 MG tablet Take 40 mg by mouth daily.        . rosuvastatin (CRESTOR) 40 MG tablet Take 40 mg by mouth daily.        Marland Kitchen  traMADol (ULTRAM) 50 MG tablet Take 50 mg by mouth every 4 (four) hours as needed.         Allergies  Allergen Reactions  . Codeine Other (See Comments)    GI      Review of Systems Negative  Physical Exam Blood pressure 138/82 pulse 72 and regular saturation remained 99% Patient is a well nourished middle-aged male no acute distress. The sternal incision is well healed. Breath sounds are clear and equal. Cardiac rhythm is regular without rub or gallop. The leg incision is well-healed. There is no pedal edema.  Diagnostic Tests No tests today.  Impression Status post CABG x3 for multivessel disease with unstable angina Sternal chest wall tenderness. Diabetes mellitus Reformed smoker  Plan He will continue his postoperative recovery with rehabilitation at [facility]. He will refrain from lifting more than 20 pounds until October 1. He will continue his current medications and return here as needed.

## 2011-02-23 NOTE — Patient Instructions (Signed)
Do not smoke. Do not lift more than 25 pounds until Sept 26. You may drive. Outpatient cardiac rehab@ [facility] is reccommended. Return as needed.

## 2011-02-27 ENCOUNTER — Inpatient Hospital Stay (HOSPITAL_COMMUNITY)
Admission: AD | Admit: 2011-02-27 | Discharge: 2011-03-03 | DRG: 246 | Disposition: A | Discharge status: Home / Self Care | Payer: PRIVATE HEALTH INSURANCE | Source: Other Acute Inpatient Hospital | Attending: Cardiology | Admitting: Cardiology

## 2011-02-27 DIAGNOSIS — J4489 Other specified chronic obstructive pulmonary disease: Secondary | ICD-10-CM | POA: Diagnosis present

## 2011-02-27 DIAGNOSIS — I25810 Atherosclerosis of coronary artery bypass graft(s) without angina pectoris: Secondary | ICD-10-CM | POA: Diagnosis present

## 2011-02-27 DIAGNOSIS — Y840 Cardiac catheterization as the cause of abnormal reaction of the patient, or of later complication, without mention of misadventure at the time of the procedure: Secondary | ICD-10-CM | POA: Diagnosis present

## 2011-02-27 DIAGNOSIS — J988 Other specified respiratory disorders: Secondary | ICD-10-CM | POA: Diagnosis not present

## 2011-02-27 DIAGNOSIS — Z7901 Long term (current) use of anticoagulants: Secondary | ICD-10-CM

## 2011-02-27 DIAGNOSIS — I200 Unstable angina: Secondary | ICD-10-CM | POA: Diagnosis present

## 2011-02-27 DIAGNOSIS — Z8249 Family history of ischemic heart disease and other diseases of the circulatory system: Secondary | ICD-10-CM

## 2011-02-27 DIAGNOSIS — J449 Chronic obstructive pulmonary disease, unspecified: Secondary | ICD-10-CM | POA: Diagnosis present

## 2011-02-27 DIAGNOSIS — E785 Hyperlipidemia, unspecified: Secondary | ICD-10-CM | POA: Diagnosis present

## 2011-02-27 DIAGNOSIS — R079 Chest pain, unspecified: Secondary | ICD-10-CM

## 2011-02-27 DIAGNOSIS — D649 Anemia, unspecified: Secondary | ICD-10-CM | POA: Diagnosis present

## 2011-02-27 DIAGNOSIS — I2510 Atherosclerotic heart disease of native coronary artery without angina pectoris: Principal | ICD-10-CM | POA: Diagnosis present

## 2011-02-27 DIAGNOSIS — J189 Pneumonia, unspecified organism: Secondary | ICD-10-CM | POA: Diagnosis not present

## 2011-02-27 DIAGNOSIS — F17200 Nicotine dependence, unspecified, uncomplicated: Secondary | ICD-10-CM | POA: Diagnosis present

## 2011-02-27 LAB — CARDIAC PANEL(CRET KIN+CKTOT+MB+TROPI)
CK, MB: 2.2 ng/mL (ref 0.3–4.0)
Relative Index: INVALID (ref 0.0–2.5)
Troponin I: <0.3 ng/mL (ref <0.30)

## 2011-02-27 LAB — BASIC METABOLIC PANEL
Chloride: 101 mEq/L (ref 96–112)
GFR calc Af Amer: >60 mL/min (ref >60)
GFR calc non Af Amer: >60 mL/min (ref >60)
Glucose, Bld: 168 mg/dL — ABNORMAL HIGH (ref 70–99)
Potassium: 4.8 mEq/L (ref 3.5–5.1)
Sodium: 135 mEq/L (ref 135–145)

## 2011-02-27 LAB — CBC
Hemoglobin: 9.2 g/dL — ABNORMAL LOW (ref 13.0–17.0)
MCV: 78.5 fL (ref 78.0–100.0)
Platelets: 224 10*3/uL (ref 150–400)
RBC: 3.68 MIL/uL — ABNORMAL LOW (ref 4.22–5.81)
WBC: 6.1 10*3/uL (ref 4.0–10.5)

## 2011-02-28 DIAGNOSIS — I2510 Atherosclerotic heart disease of native coronary artery without angina pectoris: Secondary | ICD-10-CM

## 2011-02-28 LAB — GLUCOSE, CAPILLARY: Glucose-Capillary: 161 mg/dL — ABNORMAL HIGH (ref 70–99)

## 2011-02-28 LAB — LIPID PANEL
Cholesterol: 127 mg/dL (ref 0–200)
HDL: 43 mg/dL (ref >39)
Total CHOL/HDL Ratio: 3 RATIO

## 2011-02-28 LAB — CARDIAC PANEL(CRET KIN+CKTOT+MB+TROPI): Relative Index: INVALID (ref 0.0–2.5)

## 2011-02-28 LAB — HEPARIN LEVEL (UNFRACTIONATED)
Heparin Unfractionated: 0.29 IU/mL — ABNORMAL LOW (ref 0.30–0.70)
Heparin Unfractionated: 0.5 IU/mL (ref 0.30–0.70)

## 2011-03-01 LAB — BASIC METABOLIC PANEL
BUN: 16 mg/dL (ref 6–23)
Chloride: 99 mEq/L (ref 96–112)
Creatinine, Ser: 0.94 mg/dL (ref 0.50–1.35)
GFR calc non Af Amer: >60 mL/min (ref >60)
Glucose, Bld: 177 mg/dL — ABNORMAL HIGH (ref 70–99)
Potassium: 4.7 mEq/L (ref 3.5–5.1)

## 2011-03-01 LAB — CBC
HCT: 32.5 % — ABNORMAL LOW (ref 39.0–52.0)
MCH: 24.2 pg — ABNORMAL LOW (ref 26.0–34.0)
MCV: 78.7 fL (ref 78.0–100.0)
RDW: 15.3 % (ref 11.5–15.5)
WBC: 10.1 10*3/uL (ref 4.0–10.5)

## 2011-03-01 LAB — GLUCOSE, CAPILLARY
Glucose-Capillary: 80 mg/dL (ref 70–99)
Glucose-Capillary: 229 mg/dL — ABNORMAL HIGH (ref 70–99)

## 2011-03-01 LAB — POCT ACTIVATED CLOTTING TIME: Activated Clotting Time: 122 seconds

## 2011-03-02 DIAGNOSIS — I25810 Atherosclerosis of coronary artery bypass graft(s) without angina pectoris: Secondary | ICD-10-CM

## 2011-03-02 LAB — GLUCOSE, CAPILLARY
Glucose-Capillary: 154 mg/dL — ABNORMAL HIGH (ref 70–99)
Glucose-Capillary: 188 mg/dL — ABNORMAL HIGH (ref 70–99)
Glucose-Capillary: 194 mg/dL — ABNORMAL HIGH (ref 70–99)
Glucose-Capillary: 187 mg/dL — ABNORMAL HIGH (ref 70–99)

## 2011-03-02 LAB — CBC
Platelets: 227 10*3/uL (ref 150–400)
RDW: 15.1 % (ref 11.5–15.5)
WBC: 7.8 10*3/uL (ref 4.0–10.5)

## 2011-03-02 LAB — BASIC METABOLIC PANEL
Chloride: 100 mEq/L (ref 96–112)
GFR calc Af Amer: >60 mL/min (ref >60)
Potassium: 4.2 mEq/L (ref 3.5–5.1)
Sodium: 137 mEq/L (ref 135–145)

## 2011-03-02 LAB — HEPARIN LEVEL (UNFRACTIONATED): Heparin Unfractionated: 0.37 IU/mL (ref 0.30–0.70)

## 2011-03-03 DIAGNOSIS — I200 Unstable angina: Secondary | ICD-10-CM

## 2011-03-03 LAB — CBC
HCT: 36.3 % — ABNORMAL LOW (ref 39.0–52.0)
MCV: 77.6 fL — ABNORMAL LOW (ref 78.0–100.0)
Platelets: 209 10*3/uL (ref 150–400)
RBC: 4.68 MIL/uL (ref 4.22–5.81)
WBC: 7.9 10*3/uL (ref 4.0–10.5)

## 2011-03-03 LAB — BASIC METABOLIC PANEL
CO2: 27 mEq/L (ref 19–32)
Chloride: 99 mEq/L (ref 96–112)
Creatinine, Ser: 0.92 mg/dL (ref 0.50–1.35)

## 2011-03-03 LAB — GLUCOSE, CAPILLARY: Glucose-Capillary: 199 mg/dL — ABNORMAL HIGH (ref 70–99)

## 2011-03-03 NOTE — Cardiovascular Report (Signed)
NAMEMELVILLE, [name] NO.:  [id number]  MEDICAL RECORD NO.:  [id number]  LOCATION:  2503                         FACILITY:  [facility]  PHYSICIAN:  Verne Carrow, MDDATE OF BIRTH:  06-28-1950  DATE OF PROCEDURE:  03/02/2011 DATE OF DISCHARGE:                           CARDIAC CATHETERIZATION   PROCEDURE PERFORMED: 1. Percutaneous transluminal coronary angioplasty with placement of     two drug-eluting stents in the mid and distal right coronary     artery. 2. Percutaneous transluminal coronary angioplasty with placement of a     single drug-eluting stent in the distal body of the saphenous vein     graft to the posterolateral artery.  OPERATOR:  Verne Carrow, MD  INDICATION:  This a 60 year old Caucasian male with a history of hypertension, diabetes mellitus, and coronary artery disease, who underwent three-vessel coronary artery bypass grafting on December 20, 2010. The patient was readmitted to the hospital 2 days ago with chest pain and ruled in for myocardial infarction with serial cardiac enzymes. Diagnostic catheterization was performed yesterday by Dr. Arvilla Meres.  The patient was found to have severe stenosis in the mid and distal native right coronary artery with an occluded vein graft to the posterior descending artery.  The posterolateral artery was totally occluded in its ostium and filled from vein graft.  The vein graft to the posterolateral artery did have severe stenosis in the distal body of the graft.  The films reviewed by Dr. Shawnie Pons, last night and he felt that the intervention should be performed.  However, based on the schedule the intervention was postponed until today.  The patient is stable overnight and was brought back to the cath lab today for the planned intervention.  PROCEDURE IN DETAIL:  The patient was brought to the Main Cardiac Catheterization Laboratory after signing informed consent for  the procedure.  The right groin was prepped and draped in sterile fashion. 1% lidocaine was used for local anesthesia.  A 6-French sheath was inserted into the right femoral artery without difficulty.  We initially engaged the native right coronary artery with a JR-4 guiding catheter. The patient was given a bolus of Angiomax and drip was started.  When ACT was greater than 200, we passed a Cougar intracoronary wire down the length of the right coronary artery into the distal vessel.  A 2.5 x 50- mm balloon was inflated twice in the distal segment.  A 2.5 x 24-mm Promus element drug-eluting stent was carefully positioned in the distal vessel and deployed without difficulty.  A 2.5 x 20-mm noncompliant balloon was carefully positioned inside the stent was inflated to 13 atmospheres.  There was excellent angiographic result.  We then used a 2.5 x 15-mm balloon to dilate the mid lesion.  A 3.5 x 24-mm Promus element drug-eluting stent was carefully positioned in the midvessel and deployed without difficulty.  A 3.75 x 20-mm noncompliant balloon was carefully positioned inside the stent, deployed at 15 atmospheres. There was an excellent angiographic result.  The distal stenosis was taken from 99% down to 0%.  The mid stenosis was taken from 95% to 0%. At this point, we turned our attention toward the  severe stenosis in the body of the vein graft to the posterolateral artery.  The same guiding catheter was taken down the right coronary artery and used to engage the saphenous vein graft ostium.  The Cougar intracoronary wire was passed down the body of the vein graft into the target vessel which was the posterolateral artery.  We then used a 20-mm balloon to measure the length of the stenosis.  This balloon was not inflated.  A 3.5 x 28-mm Promus element drug-eluting stent was carefully positioned in the distal body of the vein graft to the posterolateral artery and deployed at 11 atmospheres.   The stenosis was taken from 99% down to 0%.  There was an excellent angiographic result with excellent flow into the target vessel.  There were no immediate complications.  The patient was taken to the recovery area in stable condition.  IMPRESSION: 1. Successful percutaneous transluminal coronary angioplasty with     placement of two non-overlapping drug-eluting stents in the mid and     distal right coronary artery. 2. Successful percutaneous transluminal coronary angioplasty with     placement of a drug-eluting stent in the distal body of the     saphenous vein graft to the posterolateral artery.  RECOMMENDATIONS:  We will continue current medical therapy which includes aspirin, Plavix, statin, and beta-blocker.     Verne Carrow, MD     CM/MEDQ  D:  03/02/2011  T:  03/02/2011  Job:  [id number]  Electronically Signed by Verne Carrow MD on 03/03/2011 05:32:52 PM

## 2011-03-10 ENCOUNTER — Telehealth: Payer: Self-pay | Admitting: Cardiovascular Disease

## 2011-03-10 DIAGNOSIS — I209 Angina pectoris, unspecified: Secondary | ICD-10-CM

## 2011-03-10 MED ORDER — ISOSORBIDE MONONITRATE ER 30 MG PO TB24: 30.0000 mg | ORAL_TABLET | Freq: Every day | ORAL | Status: DC

## 2011-03-10 NOTE — Discharge Summary (Signed)
[name], [name] NO.:  [id number]  MEDICAL RECORD NO.:  [id number]  LOCATION:  2503                         FACILITY:  [facility]  PHYSICIAN:  [name], MDDATE OF BIRTH:  23-Nov-1950  DATE OF ADMISSION:  02/27/2011 DATE OF DISCHARGE:  03/03/2011                              DISCHARGE SUMMARY   PROCEDURES: 1. Cardiac catheterization. 2. Coronary arteriogram. 3. Left ventriculogram. 4. LIMA arteriogram. 5. SVG angiogram. 6. Staged recatheterization for PCI. 7. PTCA and 2.5 x 24-mm Promus drug-eluting stent to the distal RCA. 8. PTCA and 3.5 x 24-mm Promus drug-eluting stent to the mid RCA. 9. PTCA and 3.5 x 28-mm Promus drug-eluting stent of the SVG to PLA.  PRIMARY FINAL DISCHARGE DIAGNOSIS:  Unstable anginal pain.  SECONDARY DIAGNOSES: 1. Status post aortocoronary bypass surgery on December 23, 2010 with left     internal mammary artery to left anterior descending, saphenous vein     graft to posterior descending artery, saphenous vein graft to     posterolateral artery. 2. Diabetes. 3. Non-ST segment elevation myocardial infarction in June 2012 prior     to bypass. 4. Hyperlipidemia. 5. Postoperative pneumonia. 6. Chronic obstructive pulmonary disease. 7. Tobacco abuse. 8. Intolerance to codeine. 9. Family history of premature coronary artery disease. 10.History of depression in 2009. 11.Status post bilateral shoulder surgeries, appendectomy,     cholecystectomy and tonsillectomy.  TIME AT DISCHARGE:  41 minutes.  HOSPITAL COURSE:  [name] is a 60 year old male with a history of coronary artery disease.  He had chest pain and went to [facility].  There, he had chest pain that was despite being on heparin and nitroglycerin, and he had some inferior ST changes.  He was transferred to [facility] for further evaluation and treatment.  He was painfree on medical therapy.  His cardiac enzymes were negative for MI.  A lipid profile  showed an HDL of 43 and LDL of 59.  He was taken to the cath lab on February 27, 2011, and the SVG to PDA had a 99% stenosis.  The SVG to PL was totalled with clot.  The native RCA had a 90% stenosis in the midportion and a 99% stenosis in the distal portion. The films were reviewed by Dr. Gala Romney and Dr. Riley Kill and it was felt this staged percutaneous intervention was indicated.  He was taken back to the cath lab on March 02, 2011 for this.  He had three drug- eluting stents described above and tolerated the procedure well.  On March 03, 2011, [name] was mildly anemic with a hemoglobin of 11.4 and MCV is 77.6.  His postprocedure BMET was stable.  Smoking cessation was reinforced as the patient has already quit.  His EF was 40- 45% and he was continued on his ACE inhibitor and beta blocker.  Dr. Clifton James evaluated [name] and considered him stable for discharge, to follow up as an outpatient.  DISCHARGE INSTRUCTIONS:  His activity level is to be increased gradually.  He is to call our office for problems with the cath site. He is encouraged to stick to a low-sodium diabetic diet.  He is to follow up in the  [place] office and a message has been left for an appointment.  He is to follow up with Dr. Sherryll Burger as needed or as scheduled.  DISCHARGE MEDICATIONS: 1. Lisinopril 5 mg daily. 2. Bupropion XL 150 mg daily. 3. Lopressor 25 mg b.i.d. 4. Metformin 500 mg, is on hold until March 05, 2011. 5. Colace 100 mg 2 capsules every night. 6. gemfibrozil 600 mg b.i.d. 7. Crestor 40 mg every night. 8. Sublingual nitroglycerin p.r.n. 9. Tramadol 50 mg p.r.n. 10.Aspirin 81 mg a day. 11.Diclofenac 75 mg b.i.d. 12.Ibuprofen 200 mg, is discontinued. 13.Plavix 75 mg daily. 14.Protonix 40 mg daily. 15.Amaryl 4 mg b.i.d.     [name] Demark, PA-C   ______________________________ [name] Carrow, MD    RB/MEDQ  D:  03/03/2011  T:  03/03/2011  Job:  [id number]  cc:   Kirstie Peri, MD  Electronically Signed by [name] Demark PA-C on 03/10/2011 03:31:15 PM Electronically Signed by [name] Carrow MD on 03/10/2011 09:36:44 PM

## 2011-03-10 NOTE — Telephone Encounter (Signed)
Patient's wife states patient had a cardiac cath on 03/01/11 on 03/02/11 he had three stents placement and he was d/c on the 6 th. Wife states during the hospitalization patient was having chest pain he describes as stubbing pain. He  was given NTG SL. Pt continue having chest pain every day after d/c except on the 11 th, and he has been taken NTG SL when he can't stand the pain with relief. Today he woke up with chest pain again, he has not taken NTG at this time. He would like to know what else he can take to stop  this CP from occuring every day. Dr Excell Seltzer DOD recommends for pt to start Imdur 30 mg once a day, prescription called to [facility]. Patient to keep F/U appointment with [facility] in [place]. Patient's wife aware.

## 2011-03-10 NOTE — Telephone Encounter (Signed)
Per pt wife call pt c/o chest pain, started on Saturday; pt took nitro it quit; on Sunday pt c/o chest pain, took two nitro it quit, Monday pt c/o chest pain, took one nitro, Tuesday pt didn't take any nitro, pt had chest pain again Wednesday night and took one nitro with relief. Pt c/o chest pain again but doesn't want to keep taking nitro. Pt wife is concerned and doesn't know if pt needs to come in to see MD.   If not answer on cell try work number [phone] option [phone]  Call transferred to triage

## 2011-03-11 ENCOUNTER — Telehealth: Payer: Self-pay | Admitting: *Deleted

## 2011-03-11 NOTE — Telephone Encounter (Signed)
9/13 - 12:19   LM              3:45  LM  Spoke with wife today.  Rescheduled visit from 9/27 to 9/18 to facilitate earlier evaluation.   Did advise ED/911 if symptoms worsen over weekend.  She verbalized understanding.

## 2011-03-15 ENCOUNTER — Other Ambulatory Visit: Payer: Self-pay | Admitting: Cardiology

## 2011-03-15 ENCOUNTER — Ambulatory Visit (INDEPENDENT_AMBULATORY_CARE_PROVIDER_SITE_OTHER): Payer: PRIVATE HEALTH INSURANCE | Admitting: Cardiology

## 2011-03-15 VITALS — BP 130/78 | HR 66 | Ht 67.0 in | Wt 192.0 lb

## 2011-03-15 DIAGNOSIS — R072 Precordial pain: Secondary | ICD-10-CM

## 2011-03-15 DIAGNOSIS — I209 Angina pectoris, unspecified: Secondary | ICD-10-CM

## 2011-03-15 DIAGNOSIS — I2510 Atherosclerotic heart disease of native coronary artery without angina pectoris: Secondary | ICD-10-CM

## 2011-03-15 DIAGNOSIS — Z951 Presence of aortocoronary bypass graft: Secondary | ICD-10-CM

## 2011-03-15 DIAGNOSIS — I10 Essential (primary) hypertension: Secondary | ICD-10-CM

## 2011-03-15 DIAGNOSIS — I214 Non-ST elevation (NSTEMI) myocardial infarction: Secondary | ICD-10-CM

## 2011-03-15 MED ORDER — NITROGLYCERIN 0.4 MG SL SUBL: 0.4000 mg | SUBLINGUAL_TABLET | SUBLINGUAL | Status: DC | PRN

## 2011-03-15 MED ORDER — ISOSORBIDE MONONITRATE ER 60 MG PO TB24: 60.0000 mg | ORAL_TABLET | Freq: Every day | ORAL | Status: DC

## 2011-03-15 NOTE — Assessment & Plan Note (Signed)
Blood pressure is well controlled. Continue current medical regimen

## 2011-03-15 NOTE — Assessment & Plan Note (Signed)
The patient continues to report substernal chest pain despite recent intervention. However his chest pain is both typical and atypical features. His chest pain did resolve with nitroglycerin and increase his isosorbide mononitrate 60 mg a day. However during pain there were no EKG changes nor were any segmental wall motion abnormalities by echocardiogram. We'll proceed with a Lexi scan tomorrow to evaluate if there is any ongoing ischemia

## 2011-03-15 NOTE — Assessment & Plan Note (Signed)
Despite recent bypass grafting the patient already has occlusion of one of his vein graft to the high-grade lesion and another vein graft to the right coronary artery. He status post his current intervention with drug-eluting stents and is on aspirin and Plavix. He is compliant with his medical regimen.

## 2011-03-15 NOTE — Progress Notes (Signed)
HPI The patient is a 60 year old male with a history of substernal chest pain ultimately requiring coronary artery bypass grafting in June of 2012. Following surgery the patient had ongoing substernal chest pain which required repeat catheterization September of 2012. Despite his recent bypass surgery which included a left internal mammary artery graft to the LAD as well as 2 saphenous vein grafts to the PDA and posterolateral branch respectively, the patient developed recurrent chest pain. The cardiac catheterization demonstrated that the patient had occluded the saphenous vein graft to the PDA. In addition he had a high-grade lesion in the graft to the posterior lateral branch. He also had an 80% stenosis in the LAD but internal mammary artery graft to the LAD was patent. The patient underwent stent placement x3 to the saphenous vein graft as well as the bifurcation of the PDA and posterolateral branch. The vein graft to the posterior descending artery was felt to be completely occluded.The patient continues to have ongoing chest pain which does appear to be responsive to nitroglycerin. An echocardiogram was performed in the office while he was having chest pain which showed no wall motion abnormalities and there was some reproducibility of pain on the left side of the chest wall. His ejection fraction was normal by echocardiogram. His EKG also showed no acute ischemic changes. For unclear reasons the patient was told not to take any sublingual nitroglycerin after recent hospital discharge. He was given nitroglycerin in the office today which improved his chest pain significantly. As matter fact after second nitroglycerin his pain completely resolved.  The plan is to proceed with a Lexi scan tomorrow to identify any additional areas of ischemia and possible need for repeat cardiac catheterization. In the interim the patient has been device to increase his Imdur from 30 mg to 60 mg. He can also take when  necessary nitroglycerin as needed. If his Lexi   Allergies  Allergen Reactions  . Codeine Other (See Comments)    GI     Current Outpatient Prescriptions on File Prior to Visit  Medication Sig Dispense Refill  . aspirin 81 MG EC tablet Take 81 mg by mouth daily.        . diclofenac (VOLTAREN) 75 MG EC tablet Take 75 mg by mouth 2 (two) times daily.        Marland Kitchen docusate sodium (COLACE) 100 MG capsule Take 200 mg by mouth at bedtime.       Marland Kitchen gemfibrozil (LOPID) 600 MG tablet Take 600 mg by mouth 2 (two) times daily before a meal.        . glimepiride (AMARYL) 4 MG tablet Take 4 mg by mouth 2 (two) times daily.        Marland Kitchen lisinopril (PRINIVIL,ZESTRIL) 5 MG tablet Take 5 mg by mouth daily.        . metFORMIN (GLUCOPHAGE) 500 MG tablet Take 1,000 mg by mouth 2 (two) times daily with a meal.        . metoprolol tartrate (LOPRESSOR) 25 MG tablet Take 25 mg by mouth 2 (two) times daily.        . Multiple Vitamin (MULTIVITAMIN) tablet Take 1 tablet by mouth daily.        . pantoprazole (PROTONIX) 40 MG tablet Take 40 mg by mouth daily.        . rosuvastatin (CRESTOR) 40 MG tablet Take 40 mg by mouth daily.          Past Medical History  Diagnosis Date  . Hypertension   .  Diabetes mellitus   . COPD (chronic obstructive pulmonary disease)   . Dyslipidemia   . Coronary artery disease     s/p recent non-ST elevation MI. Had three vessel bypass grafting with a LIMA to LAD,saphenous vein graft to his RCA,and saphenous vein graft to his PDA    Past Surgical History  Procedure Date  . Shoulder surgery     bilateral shoulder surgeries  . Appendectomy   . Cholecystectomy   . Tonsillectomy   . Coronary artery bypass graft 01/01/2011    [facility]AN TRIGT    Family History  Problem Relation Age of Onset  . Diabetes      family history of  . Hypertension      family history of  . Other      negative for premature coronary disease and coronary bypass surgery    History   Social History  .  Marital Status: Married    Spouse Name: N/A    Number of Children: N/A  . Years of Education: N/A   Occupational History  . Not on file.   Social History Main Topics  . Smoking status: Former Smoker -- 1.0 packs/day for 50 years    Types: Cigarettes    Quit date: 02/02/2011  . Smokeless tobacco: Never Used  . Alcohol Use: No     drinks alcohol occasionally  . Drug Use: Yes  . Sexually Active: Not on file   Other Topics Concern  . 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 The remainder of the 18  point review of systems is negative  PHYSICAL EXAM BP 130/78  Pulse 66  Ht 5\' 7"  (1.702 m)  Wt 192 lb (87.091 kg)  BMI 30.07 kg/m2  General: Well-developed, well-nourished in no distress Head: Normocephalic and atraumatic Eyes:PERRLA/EOMI intact, conjunctiva and lids normal Ears: No deformity or lesions Mouth:normal dentition, normal posterior pharynx Neck: Supple, no JVD.  No masses, thyromegaly or abnormal cervical nodes Lungs: Normal breath sounds bilaterally without wheezing.  Normal percussion Cardiac: regular rate and rhythm with normal S1 and S2, no S3 or S4.  PMI is normal.  No pathological murmurs Abdomen: Normal bowel sounds, abdomen is soft and nontender without masses, organomegaly or hernias noted.  No hepatosplenomegaly MSK: Back normal, normal gait muscle strength and tone normal Vascular: Pulse is normal in all 4 extremities Extremities: No peripheral pitting edema Neurologic: Alert and oriented x 3 Skin: Intact without lesions or rashes Lymphatics: No significant adenopathy Psychologic: Normal affect   ECG: No acute ischemic changes  Limited bedside echocardiogram during chest pain: No segmental wall motion abnormalities. Normal LV function  ASSESSMENT AND PLAN

## 2011-03-15 NOTE — Patient Instructions (Addendum)
   Lexiscan Cardiolite (stress test)  Increase Imdur to 60mg  daily   Nitroglycerin - as needed for severe chest pain.  May take 3 in a 15 minute time frame.  If no releif after 2 tabs, need to go to ED for evaluation Follow up in  2 weeks - see abvove

## 2011-03-16 ENCOUNTER — Encounter: Payer: Self-pay | Admitting: *Deleted

## 2011-03-16 ENCOUNTER — Telehealth: Payer: Self-pay | Admitting: Cardiology

## 2011-03-16 DIAGNOSIS — R072 Precordial pain: Secondary | ICD-10-CM

## 2011-03-16 NOTE — Telephone Encounter (Signed)
Mrs. [name] called the office stating that she was under the impression that someone would be calling Her about her husband's stress test from today. I sent Dr.Degent a message and he said that he would take Care of calling her tonight.  Telephone #s given to Dr. Andee Lineman. i also tried to call [name] extension at [facility] [phone] and received her voice mail. Tried to leave a message but the phone would not Accept a voice message.

## 2011-03-17 LAB — CBC
HCT: 37.7 — ABNORMAL LOW
MCHC: 34.9
MCV: 87.4
Platelets: 233
RDW: 13.5
WBC: 11.5 — ABNORMAL HIGH

## 2011-03-17 LAB — DIFFERENTIAL
Basophils Absolute: 0.1
Basophils Relative: 1
Eosinophils Absolute: 0.2
Eosinophils Relative: 2
Lymphs Abs: 3
Neutrophils Relative %: 65

## 2011-03-17 NOTE — Telephone Encounter (Signed)
Has been taken care off

## 2011-03-18 ENCOUNTER — Telehealth: Payer: Self-pay | Admitting: Cardiology

## 2011-03-18 LAB — DIFFERENTIAL
Basophils Relative: 0
Eosinophils Absolute: 0.1
Eosinophils Relative: 2
Monocytes Relative: 8
Neutrophils Relative %: 62

## 2011-03-18 LAB — HEPATIC FUNCTION PANEL
ALT: 28
Alkaline Phosphatase: 69
Bilirubin, Direct: 0.1
Indirect Bilirubin: 0.4
Total Bilirubin: 0.5
Total Protein: 6.6

## 2011-03-18 LAB — URINALYSIS, ROUTINE W REFLEX MICROSCOPIC
Glucose, UA: NEGATIVE
Specific Gravity, Urine: 1.02
pH: 7

## 2011-03-18 LAB — BASIC METABOLIC PANEL
BUN: 13
CO2: 25
Chloride: 102
Creatinine, Ser: 0.82
Potassium: 4.1

## 2011-03-18 LAB — HEMOGLOBIN A1C: Hgb A1c MFr Bld: 8.1 — ABNORMAL HIGH

## 2011-03-18 LAB — CBC
HCT: 44.1
MCHC: 33
MCV: 89.7
RBC: 4.92

## 2011-03-18 LAB — RAPID URINE DRUG SCREEN, HOSP PERFORMED
Barbiturates: NOT DETECTED
Benzodiazepines: NOT DETECTED
Cocaine: NOT DETECTED

## 2011-03-18 LAB — VITAMIN B12: Vitamin B-12: 385 (ref 211–911)

## 2011-03-23 ENCOUNTER — Other Ambulatory Visit: Payer: Self-pay | Admitting: Physician Assistant

## 2011-03-23 ENCOUNTER — Other Ambulatory Visit: Payer: Self-pay | Admitting: Cardiology

## 2011-03-23 ENCOUNTER — Encounter: Payer: Self-pay | Admitting: *Deleted

## 2011-03-23 NOTE — Telephone Encounter (Signed)
Spoke with wife Harriett Sine) - states that the pills has eased his pain (Imdur).  Not totally gone, but able to tolerate now.  Does have OV on 10/2.  Do you want to still do CTA of chest as suggested in voice dictation or wait till see on 10/2 since he had improvement on med?

## 2011-03-24 ENCOUNTER — Encounter: Payer: PRIVATE HEALTH INSURANCE | Admitting: Cardiology

## 2011-03-24 NOTE — Telephone Encounter (Signed)
Suggest to increase isosorbide mononitrate 90 mg by mouth daily. Will see back as scheduled office visit.

## 2011-03-25 ENCOUNTER — Encounter: Payer: Self-pay | Admitting: Cardiology

## 2011-03-29 ENCOUNTER — Encounter: Payer: Self-pay | Admitting: Cardiology

## 2011-03-29 ENCOUNTER — Ambulatory Visit (INDEPENDENT_AMBULATORY_CARE_PROVIDER_SITE_OTHER): Payer: PRIVATE HEALTH INSURANCE | Admitting: Cardiology

## 2011-03-29 VITALS — BP 136/80 | HR 66 | Ht 69.0 in | Wt 191.1 lb

## 2011-03-29 DIAGNOSIS — Z951 Presence of aortocoronary bypass graft: Secondary | ICD-10-CM

## 2011-03-29 DIAGNOSIS — I2510 Atherosclerotic heart disease of native coronary artery without angina pectoris: Secondary | ICD-10-CM

## 2011-03-29 DIAGNOSIS — E785 Hyperlipidemia, unspecified: Secondary | ICD-10-CM

## 2011-03-29 DIAGNOSIS — E162 Hypoglycemia, unspecified: Secondary | ICD-10-CM

## 2011-03-29 DIAGNOSIS — I209 Angina pectoris, unspecified: Secondary | ICD-10-CM

## 2011-03-29 MED ORDER — ISOSORBIDE MONONITRATE ER 60 MG PO TB24: ORAL_TABLET | ORAL | Status: DC

## 2011-03-29 MED ORDER — ACETAMINOPHEN 500 MG PO TABS: 1000.0000 mg | ORAL_TABLET | Freq: Four times a day (QID) | ORAL | Status: DC | PRN

## 2011-03-29 MED ORDER — GLIMEPIRIDE 4 MG PO TABS: 4.0000 mg | ORAL_TABLET | ORAL | Status: DC

## 2011-03-29 NOTE — Assessment & Plan Note (Signed)
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

## 2011-03-29 NOTE — Assessment & Plan Note (Signed)
The patient status post recent bypass grafting and vein graft occlusion requiring intervention. His chest pain is much improved with the addition of isosorbide mononitrate to his medical regimen. I do not suspect that he has new graft disease or new epicardial vessel disease but rather has some ongoing ischemia from small vessel disease secondary to diabetes. We will continue with aggressive risk factor modification and up titration of nitrates. The patient states that he has at least a 50% improvement and also not certain that some of the symptoms are not somewhat atypical. We will continue to follow him closely for now but I do not think a repeat catheterization is warranted

## 2011-03-29 NOTE — Assessment & Plan Note (Signed)
Continue aggressive lipid lowering and followup of lipid panel with his primary care physician

## 2011-03-29 NOTE — Assessment & Plan Note (Signed)
Patient has significant episodes of hypoglycemia in the p.m. and asked him to hold his p.m. Amaryl for now to followup with his primary care physician

## 2011-03-29 NOTE — Progress Notes (Signed)
History of present illness:  The patient is a 60 year old male with a history of non-ST elevation myocardial infarction July 2012 this was followed by coronary artery bypass grafting. Several months later the patient had recurrent substernal chest pain and was found to have graft stenosis requiring stent placement. During the last office visit he still complained of some chest pain both with atypical features and typical features. He did report that he did not have the usual back pain as a prior presentation. We proceeded with a nuclear imaging perfusion study which showed no definite ischemia although there was an area of inferior hypokinesis. This was consistent with a bedside echocardiogram that was obtained in the office during the last visit showing inferior hypokinesis and an ejection fraction of 60%. There were no significant bowel abnormalities. Overall the patient reports a 50% improvement in his chest pain syndrome. He does have exertional chest pain but has much improved with isosorbide mononitrate. He has not used preemptive sublingual nitroglycerin prior to activity. He does have some sternotomy pain, as well as left-sided chest pain which is atypical. The patient reports that at times his blood sugar is very low particular in the evening, sometimes even postprandial. He has been placed on Amaryl in addition to his metformin. Often he needs an evening snack because of hypoglycemia. He has symptoms of dizziness, diaphoresis and presyncope associated with this.  Allergies, social history and family history: Previously reviewed and unchanged  Past medical history: See problem was below  Medications: Reviewed and documented in the chart  Review of systems: No nausea and vomiting, no fever chills, no melena hematochezia. No visual changes.  Physical examination:  Vital signs: Listed below HEENT: EOMI, PERRLA, normal carotid upstroke and no carotid bruits. No thyromegaly nonnodular  thyroid Lungs: Clear breath sounds bilaterally Heart: Regular rate and rhythm with normal S1-S2 no murmur rubs or gallops Abdomen: Soft nontender no rebound or guarding good bowel sounds Extremity exam: No cyanosis clubbing or edema Neuro: Patient is alert oriented grossly nonfocal Psychiatric: Normal affect Vascular exam: Normal dorsalis pedis and posterior 2 pulses bilaterally lower extremities

## 2011-03-29 NOTE — Patient Instructions (Addendum)
   Increase Imdur to 90mg  daily  Hold pm dose of Amaryl  Cardiac rehab - note given  Stop Diclofenac (Voltaren)  Tylenol 500mg  - take 2 tabs every 6 hours as needed for chest wall pain Your physician wants you to follow up in:  3 months.  You will receive a reminder letter in the mail one-two months in advance.  If you don't receive a letter, please call our office to schedule the follow up appointment

## 2011-03-30 NOTE — Telephone Encounter (Signed)
Patient in for OV on 10/2 & taken care of .

## 2011-04-11 ENCOUNTER — Encounter: Payer: Self-pay | Admitting: Cardiology

## 2011-04-12 ENCOUNTER — Encounter: Payer: Self-pay | Admitting: Cardiology

## 2011-04-13 ENCOUNTER — Encounter: Payer: Self-pay | Admitting: Cardiology

## 2011-04-14 ENCOUNTER — Telehealth: Payer: Self-pay | Admitting: *Deleted

## 2011-04-14 DIAGNOSIS — R079 Chest pain, unspecified: Secondary | ICD-10-CM

## 2011-04-14 NOTE — Telephone Encounter (Signed)
[name] with Cardiac Rehab called regarding low blood pressure.  States his post exercise BP has been running 86/52 & 90/54.  Not really symptomatic.  Per [name], states patient told her that he had a couple episodes of low BP over last couple weeks.  Advised her to let him go home & will forward message to GD & call patient at home.

## 2011-04-17 NOTE — Telephone Encounter (Signed)
Decrease lisinopril to 2.5 mg Po qdaily. If still low BP during cardiac rehab, schedule for GXT to assess objective measurements BP. Patient did have Cardiolite in September and I am aware of this.

## 2011-04-18 ENCOUNTER — Encounter: Payer: Self-pay | Admitting: Physician Assistant

## 2011-04-19 NOTE — Telephone Encounter (Signed)
Left message to return call with wife - ext. [phone].

## 2011-04-20 ENCOUNTER — Other Ambulatory Visit: Payer: Self-pay | Admitting: *Deleted

## 2011-04-20 DIAGNOSIS — D649 Anemia, unspecified: Secondary | ICD-10-CM

## 2011-04-20 NOTE — Telephone Encounter (Signed)
Spoke with wife.  States she took him to her PMD Sherryll Burger) on Thursday, 10/18.  States he directly admitted him that evening.  Anemia & took off 4 meds.  States he still is having the chest pain & can not continue to tolerate it.  Would like to have this addressed today.

## 2011-04-25 ENCOUNTER — Encounter: Payer: Self-pay | Admitting: Cardiology

## 2011-04-25 ENCOUNTER — Telehealth: Payer: Self-pay | Admitting: *Deleted

## 2011-04-25 ENCOUNTER — Ambulatory Visit (INDEPENDENT_AMBULATORY_CARE_PROVIDER_SITE_OTHER): Payer: PRIVATE HEALTH INSURANCE | Admitting: Cardiology

## 2011-04-25 ENCOUNTER — Encounter: Payer: Self-pay | Admitting: *Deleted

## 2011-04-25 VITALS — BP 125/83 | HR 92 | Resp 16 | Ht 67.0 in | Wt 196.0 lb

## 2011-04-25 DIAGNOSIS — I959 Hypotension, unspecified: Secondary | ICD-10-CM

## 2011-04-25 DIAGNOSIS — I2510 Atherosclerotic heart disease of native coronary artery without angina pectoris: Secondary | ICD-10-CM

## 2011-04-25 DIAGNOSIS — R0602 Shortness of breath: Secondary | ICD-10-CM

## 2011-04-25 DIAGNOSIS — R079 Chest pain, unspecified: Secondary | ICD-10-CM

## 2011-04-25 DIAGNOSIS — D509 Iron deficiency anemia, unspecified: Secondary | ICD-10-CM

## 2011-04-25 MED ORDER — TRAMADOL HCL 50 MG PO TABS: 50.0000 mg | ORAL_TABLET | Freq: Four times a day (QID) | ORAL | Status: DC | PRN

## 2011-04-25 MED ORDER — METOPROLOL TARTRATE 25 MG PO TABS: 25.0000 mg | ORAL_TABLET | Freq: Two times a day (BID) | ORAL | Status: DC

## 2011-04-25 NOTE — Assessment & Plan Note (Signed)
GI evaluation Dr. Teena Dunk is pending. This may need to be taken into account during his cardiac catheterization particularly for possible intervention is needed. Currently the patient is taking aspirin and Plavix. I did recheck his hemoglobin stable at 9.5 and has not required any transfusions.

## 2011-04-25 NOTE — Progress Notes (Signed)
History of present illness:  The patient is a 60 year old male with history of non-ST elevation myocardial infarction July 2012 followed by coronary bypass grafting. Several months later he had recurrent substernal chest pain and was found to have graft stenosis requiring stent placement. Even after stent placement the patient has had complaints of chest pain off-and-on. The patient was seen October 2 with symptoms of chest pain both typical and atypical features. A nuclear imaging study was performed which showed no definite ischemia although there was an area of inferior hypokinesis. Ejection fraction by echocardiogram was 60% with inferior hypokinesis. After increasing his Imdur the patient did report some improvement in chest pain. However the patient required another hospitalization in October 18 when he was at cardiac rehabilitation and found to be hypotensive and hypoglycemic. This was followed rapidly by symptoms of substernal chest pain which felt heavy in nature similar to his presentation several months ago prior to bypass surgery in the setting of a non-ST elevation myocardial infarction. He was noted to [facility] and ruled out for myocardial infarction. There were no acute EKG changes. He was found to be anemic with hemoglobin of 9.5 iron saturation of 6% and an iron of 31. He was guaiac positive and a GI appointment Dr. Teena Dunk has been scheduled. In the meanwhile asked the patient to increase his iron to 325 mg 3 times a day. The patient's wife called me last week with ongoing complaints of chest pain. However there is no definite worsening exertional component. The patient is seen today in the office for followup. He states that he has left-sided chest pain which is very sharp in nature between the second and third rib lateral to sternotomy scar. The pain is reproducible with palpation. However he does report also pain that is worsening on exertion. He continues to take several nitroglycerin. The  patient previously tried diclofenac and Tylenol with no relief. Nitroglycerin does seem to partially relieve his chest pain. His electrocardiogram in the office today shows no acute changes.    Allergies, family history and social history: As documented in chart and reviewed  Medications: Documented and reviewed in chart.  Past medical history: Reviewed and see problem list below   Review of systems: No nausea or vomiting, no fever or chills. No melena hematochezia. No dysuria frequency. No visual changes   Physical examination : Vital signs documented below General: Well-nourished white male in no apparent distress complaining of some mild left-sided chest pain HEENT: EOMI, PERRLA, normal carotid upstroke no carotid bruits Lungs: Clear breath sounds bilaterally no wheezing Chest: Well-healed sternotomy scar with reproducible pain in the third intercostal space left lateral. Heart: Regular rate and rhythm with normal S1-S2 no murmur rubs or gallops. Abdomen: Abdomen soft nontender Extremity exam: No cyanosis clubbing or edema Neurologic: Alert and oriented grossly nonfocal Psychiatric: Normal affect. Vascular exam: Normal peripheral vascular exam  12 electrocardiogram: Normal sinus rhythm no acute ischemic changes

## 2011-04-25 NOTE — Assessment & Plan Note (Addendum)
The patient clearly has both symptoms of angina as well as atypical left-sided chest pain the latter being sharp in nature likely related to sternotomy. Both the patient's wife however are very concerned in even with pain medications there appears to be little relief. He was recently ruled out in the hospital and seen by Dr. Kirke Corin. The patient is very concerned and wants to proceed with a repeat cardiac catheterization. I discussed risks and benefits of the procedure with the patient and is willing to proceed. Given the atypical symptoms I did prescribe the patient a prescription with tramadol for the time being until his cardiac catheterization.

## 2011-04-25 NOTE — Assessment & Plan Note (Signed)
Resolved after beta blocker, Imdur and ACE inhibitor were discontinued. However we will restart his beta blocker. Blood pressure is within normal limits today

## 2011-04-25 NOTE — Telephone Encounter (Signed)
No precert required 

## 2011-04-25 NOTE — Telephone Encounter (Signed)
(  L) heart cath with grafts 04/29/11 Arrive 7:30 for 8:30 [employer]

## 2011-04-25 NOTE — Patient Instructions (Addendum)
Your physician recommends that you go to the [facility] for lab work: DO TODAY! Your physician has requested that you have a cardiac catheterization. Cardiac catheterization is used to diagnose and/or treat various heart conditions. Doctors may recommend this procedure for a number of different reasons. The most common reason is to evaluate chest pain. Chest pain can be a symptom of coronary artery disease (CAD), and cardiac catheterization can show whether plaque is narrowing or blocking your heart's arteries. This procedure is also used to evaluate the valves, as well as measure the blood flow and oxygen levels in different parts of your heart. For further information please visit [email or web address] Please follow instruction sheet, as given. Take Tramadol 50 mg every 6 hours as needed for pain. Metoprolol 25 mg two times a day.

## 2011-04-26 ENCOUNTER — Telehealth: Payer: Self-pay | Admitting: *Deleted

## 2011-04-26 NOTE — Telephone Encounter (Signed)
Pt's wife returned call. Notified of change in location and time for cath and verbalized understanding.

## 2011-04-26 NOTE — Telephone Encounter (Signed)
Received a call from Casimiro Needle in cath lab that due to Epic training pt's cath would need to be rescheduled.   Pt scheduled in Main lab on same day (04/29/11) with Dr. Clifton James, but pt will need to arrive at 5:30 am.  Left message on pt's home number.  Unable to leave message at pt's wife's work.

## 2011-04-26 NOTE — Telephone Encounter (Signed)
Patient seen in office on 04/25/11 by Dr. Andee Lineman

## 2011-04-29 ENCOUNTER — Ambulatory Visit (HOSPITAL_COMMUNITY)
Admission: RE | Admit: 2011-04-29 | Discharge: 2011-04-29 | Disposition: A | Discharge status: Home / Self Care | Payer: PRIVATE HEALTH INSURANCE | Source: Ambulatory Visit | Attending: Cardiovascular Disease | Admitting: Cardiovascular Disease

## 2011-04-29 DIAGNOSIS — I2510 Atherosclerotic heart disease of native coronary artery without angina pectoris: Secondary | ICD-10-CM

## 2011-04-29 DIAGNOSIS — Z9861 Coronary angioplasty status: Secondary | ICD-10-CM | POA: Insufficient documentation

## 2011-04-29 DIAGNOSIS — I25810 Atherosclerosis of coronary artery bypass graft(s) without angina pectoris: Secondary | ICD-10-CM | POA: Insufficient documentation

## 2011-04-29 LAB — GLUCOSE, CAPILLARY
Glucose-Capillary: 219 mg/dL — ABNORMAL HIGH (ref 70–99)
Glucose-Capillary: 177 mg/dL — ABNORMAL HIGH (ref 70–99)
Glucose-Capillary: 171 mg/dL — ABNORMAL HIGH (ref 70–99)

## 2011-05-02 NOTE — Cardiovascular Report (Signed)
NAMEYEIDEN, FRENKEL NO.:  [id number]  MEDICAL RECORD NO.:  [id number]  LOCATION:  [facility]                         FACILITY:  [facility]  PHYSICIAN:  Verne Carrow, MDDATE OF BIRTH:  1950/10/01  DATE OF PROCEDURE:  04/29/2011 DATE OF DISCHARGE:  04/29/2011                           CARDIAC CATHETERIZATION   PRIMARY CARDIOLOGIST:  Learta Codding, MD, [facility].  PRIMARY CARE PHYSICIAN:  Kirstie Peri, MD  PROCEDURE PERFORMED: 1. Left heart catheterization. 2. Selective coronary angiography. 3. Saphenous vein graft angiography. 4. Left internal mammary artery graft angiography. 5. Left ventricular angiogram.  OPERATOR:  Verne Carrow, MD  INDICATION:  This is a 60 year old Caucasian male with a history of coronary artery disease status post three-vessel coronary artery bypass grafting surgery in June 2012.  The patient presented back to [facility] in September with chest pain.  He was found to have an occluded vein graft to the posterior descending artery.  He was found to have severe disease in the vein graft to the posterolateral branch of the right coronary artery.  I performed intervention with placement of drug- eluting stents in the body of the saphenous vein graft to the posterolateral artery as well as 2 drug-eluting stents in the mid and distal right coronary artery.  The patient has had complaints of atypical chest pain over the last several weeks.  Diagnostic catheterization was arranged by Dr. Andee Lineman.  DETAILS OF PROCEDURE:  The patient was brought to the main cardiac catheterization laboratory after signing informed consent for the procedure.  The right groin was prepped and draped in sterile fashion. Lidocaine 1% was used for local anesthesia.  A 5-French sheath was inserted into the right femoral artery without difficulty.  A JL-4 catheter was used to selectively engage and inject the native left coronary system.  A JR-4 catheter  was used to selectively engage and inject the native right coronary artery, the saphenous vein graft to the posterolateral branch, and the left internal mammary artery graft.  We did not selectively engage the saphenous vein graft to the posterior descending artery as this is known to be occluded.  A pigtail catheter was used to perform a left ventricular angiogram.  The patient tolerated the procedure well.  There were no immediate complications.  The patient was taken to the recovery area in stable condition.  HEMODYNAMIC FINDINGS:  Central aortic pressure 88/54.  Left ventricular pressure 101/4/14.  ANGIOGRAPHIC FINDINGS: 1. The left main coronary artery had no evidence of disease. 2. The left anterior descending artery was a large vessel that coursed     to the apex and gave off 3 small diagonal branches.  The midvessel     had a tubular 65% stenosis which appears unchanged from prior     catheterizations.  All 3 diagonal branches arise from this location     in the vessel.  The second and third diagonal branches are small     caliber and have 60% ostial stenosis.  The mid and distal vessel     fill briskly both from native flow and from the graft. 3. The circumflex artery gives off a large bifurcating obtuse marginal  branch.  The second bifurcating obtuse marginal branch is small in     caliber.  There is no disease in this system. 4. The right coronary artery is a large dominant vessel with mild 30%     plaque in the mid vessel.  There is a stent present in the mid     vessel which is patent with no restenosis.  There is a distal stent     that is patent with no restenosis.  The posterior descending artery     has no disease.  The posterolateral artery is totally occluded at     its ostium and fills from the vein graft. 5. The saphenous vein graft to the posterolateral artery is patent.     There is a stent in the distal body of the vein graft that is     patent. 6. The  saphenous vein graft to the posterior descending artery is     known to be occluded and was noninjected. 7. The left internal mammary artery graft to the mid LAD is patent.     There appears to be a 40% stenosis at the anastomosis of the graft     into the left anterior descending artery.  I do not think that this     is significant as there is great flow down the native vessel both     in an antegrade fashion and great filling through the artery graft. 8. Left ventricular angiogram was performed in the RAO projection and     showed ejection fraction of 45-50%.  IMPRESSION: 1. Double-vessel coronary artery disease status post 3-vessel coronary     artery bypass graft with 2 patent bypass grafts.  The stent in the     bypass graft to the posterolateral artery is patent.  Both stents     in the native right coronary artery are patent.  There is excellent     flow down the left anterior descending artery.  There is no disease     in the circumflex system. 2. Mild left ventricular systolic dysfunction.  RECOMMENDATIONS:  At this time, I would recommend continued medical therapy.  I do not believe that the patient's chest pain is cardiac.     Verne Carrow, MD     CM/MEDQ  D:  04/29/2011  T:  04/29/2011  Job:  [id number]  cc:   Kirstie Peri, MD Learta Codding, MD,FACC  Electronically Signed by Verne Carrow MD on 05/02/2011 09:44:06 AM

## 2011-05-10 ENCOUNTER — Encounter: Payer: Self-pay | Admitting: Cardiology

## 2011-05-10 ENCOUNTER — Ambulatory Visit (INDEPENDENT_AMBULATORY_CARE_PROVIDER_SITE_OTHER): Payer: PRIVATE HEALTH INSURANCE | Admitting: Cardiology

## 2011-05-10 VITALS — BP 99/66 | HR 76 | Ht 67.0 in | Wt 195.0 lb

## 2011-05-10 DIAGNOSIS — I959 Hypotension, unspecified: Secondary | ICD-10-CM

## 2011-05-10 DIAGNOSIS — I214 Non-ST elevation (NSTEMI) myocardial infarction: Secondary | ICD-10-CM

## 2011-05-10 DIAGNOSIS — I2510 Atherosclerotic heart disease of native coronary artery without angina pectoris: Secondary | ICD-10-CM

## 2011-05-10 MED ORDER — TRAMADOL HCL 50 MG PO TABS: 50.0000 mg | ORAL_TABLET | Freq: Four times a day (QID) | ORAL | Status: DC | PRN

## 2011-05-10 NOTE — Assessment & Plan Note (Signed)
Blood pressure stable. Patient typically runs a relatively low blood pressure but is asymptomatic.

## 2011-05-10 NOTE — Assessment & Plan Note (Signed)
Chest pain improved and appears to be noncardiac based on cardiac catheterization

## 2011-05-10 NOTE — Assessment & Plan Note (Signed)
Continue current medical therapy with dual antiplatelet therapy. Chest pain appears to be noncardiac  and I given the patient a limited prescription for tramadol for sternotomy pain

## 2011-05-10 NOTE — Patient Instructions (Signed)
Your physician wants you to follow-up in: 6 months. You will receive a reminder letter in the mail one-two months in advance. If you don't receive a letter, please call our office to schedule the follow-up appointment. Your physician recommends that you continue on your current medications as directed. Please refer to the Current Medication list given to you today. Tramadol 50 mg-Take 1 tablet by mouth every 6 hours as needed for chest wall pain.

## 2011-05-10 NOTE — Progress Notes (Signed)
CC: Followup post cardiac catheterization  HPI:  The patient is a 60 year old male history of non-ST elevation myocardial infarction requiring bypass surgery July 2012. Shortly after surgery he was found to have graft stenosis requiring stent placement. Unfortunately continued to have ongoing chest pain albeit with multiple atypical features. We proceeded with a repeat cardiac catheterization and no definite areas of ischemia were identified. The patient had patent grafts and a patent stent. Please refer to the PMH below for details. The patient has been much reassured by this negative catheterization. He is more upbeat. His chest pain is actually doing better. He states that the chest pains is relieved with tramadol. He is now also actively participating in cardiac rehabilitation. Is followed in the chronic disease management program. The patient become more active and feels overall is improving. He has no shortness of breath orthopnea PND.    PMH: reviewed and listed in Problem List in Electronic Records (and see below) Cardiac catheterization November 2012 1. Double-vessel coronary artery disease status post 3-vessel coronary  artery bypass graft with 2 patent bypass grafts. The stent in the  bypass graft to the posterolateral artery is patent. Both stents  in the native right coronary artery are patent. There is excellent  flow down the left anterior descending artery. There is no disease  in the circumflex system.  2. Mild left ventricular systolic dysfunction.   Allergies/SH/FHX : available in Electronic Records for review  Medications: Current Outpatient Prescriptions  Medication Sig Dispense Refill  . acetaminophen (TYLENOL) 500 MG tablet Take 2 tablets (1,000 mg total) by mouth every 6 (six) hours as needed for pain. (for chest wall pain)      . aspirin 81 MG EC tablet Take 81 mg by mouth daily.        . clopidogrel (PLAVIX) 75 MG tablet Take 75 mg by mouth daily.        . CRESTOR  40 MG tablet TAKE ONE TABLET AT BEDTIME  30 each  6  . docusate sodium (COLACE) 100 MG capsule Take 200 mg by mouth at bedtime.       . ferrous sulfate (IRON SUPPLEMENT) 325 (65 FE) MG tablet Take 1 mg by mouth 3 (three) times daily with meals.        Marland Kitchen gemfibrozil (LOPID) 600 MG tablet Take 600 mg by mouth 2 (two) times daily before a meal.        . glimepiride (AMARYL) 4 MG tablet Take 4 mg by mouth 2 (two) times daily.        . metFORMIN (GLUCOPHAGE) 500 MG tablet Take 1,000 mg by mouth 2 (two) times daily with a meal.        . metoprolol tartrate (LOPRESSOR) 25 MG tablet Take 1 tablet (25 mg total) by mouth 2 (two) times daily.  60 tablet  6  . Multiple Vitamin (MULTIVITAMIN) tablet Take 1 tablet by mouth daily.        . nitroGLYCERIN (NITROSTAT) 0.4 MG SL tablet Place 1 tablet (0.4 mg total) under the tongue every 5 (five) minutes as needed.  25 tablet  3  . Omega-3 Fatty Acids (FISH OIL PO) Take 1 capsule by mouth 2 (two) times daily.       . pantoprazole (PROTONIX) 40 MG tablet Take 40 mg by mouth daily.        . traMADol (ULTRAM) 50 MG tablet Take 1 tablet (50 mg total) by mouth every 6 (six) hours as needed for pain. Maximum dose=  8 tablets per day  20 tablet  1    ROS: No nausea or vomiting. No fever or chills.No melena or hematochezia.No bleeding.No claudication  Physical Exam: BP 99/66  Pulse 76  Ht 5\' 7"  (1.702 m)  Wt 195 lb (88.451 kg)  BMI 30.54 kg/m2 General:Well-nourished white male in no apparent distress  Neck:Normal carotid upstroke no carotid bruits. No thyromegaly nonnodular thyroid  Lungs:Clear breath sounds bilaterally. No wheezing  Cardiac:Regular in rhythm with normal S1-S2  Vascular:No edema. 2+ peripheral pulses  Skin:Warm and dry  12lead ECG: Limited bedside ECHO:N/A   Assessment and Plan

## 2011-06-01 ENCOUNTER — Telehealth: Payer: Self-pay | Admitting: *Deleted

## 2011-06-01 NOTE — Telephone Encounter (Signed)
Message left on voice mail from wife [name]) - dentist will not clean teeth without okay from MD first.    Dr. Marcelle Smiling Phone:  [phone]

## 2011-06-01 NOTE — Telephone Encounter (Signed)
Dental cleaning can be safely done both on aspirin and Plavix. Patient should not stop antiplatelet therapy for this procedure. No endocarditis prophylaxis is needed either

## 2011-06-02 NOTE — Telephone Encounter (Signed)
Steward Drone with Dr. Hillard Danker office notified of below.  Left same info on voice mail with wife Harriett Sine),  Will fax info to Dr. Freddi Starr.  Fax:  [phone]

## 2012-01-13 ENCOUNTER — Telehealth: Payer: Self-pay | Admitting: Cardiology

## 2012-01-13 NOTE — Telephone Encounter (Signed)
Spoke with Harriett Sine in reference to having to cancel 01/17/12 appointment with [facility] due to (GD being out).  I offered to reschedule this appointment with our NP or PA in [place] or [place] office and she told me that they do not see anyone but Physicians.   I  explained that his availability will be further out and that we were trying to accommodate everyone by offering other doctors and PA's and she said that they would go to their PCP.

## 2012-01-17 ENCOUNTER — Ambulatory Visit: Payer: PRIVATE HEALTH INSURANCE | Admitting: Cardiology

## 2012-02-29 ENCOUNTER — Encounter: Payer: Self-pay | Admitting: Physician Assistant

## 2012-02-29 ENCOUNTER — Other Ambulatory Visit: Payer: Self-pay | Admitting: Physician Assistant

## 2012-02-29 DIAGNOSIS — R079 Chest pain, unspecified: Secondary | ICD-10-CM

## 2012-03-01 ENCOUNTER — Encounter (HOSPITAL_COMMUNITY)
Admission: AD | Disposition: A | Discharge status: Home / Self Care | Payer: Self-pay | Source: Other Acute Inpatient Hospital | Attending: Cardiology

## 2012-03-01 ENCOUNTER — Inpatient Hospital Stay (HOSPITAL_COMMUNITY)
Admission: AD | Admit: 2012-03-01 | Discharge: 2012-03-02 | DRG: 287 | Disposition: A | Discharge status: Home / Self Care | Payer: PRIVATE HEALTH INSURANCE | Source: Other Acute Inpatient Hospital | Attending: Cardiology | Admitting: Cardiology

## 2012-03-01 ENCOUNTER — Encounter (HOSPITAL_COMMUNITY): Payer: Self-pay | Admitting: General Practice

## 2012-03-01 ENCOUNTER — Other Ambulatory Visit: Payer: Self-pay | Admitting: Physician Assistant

## 2012-03-01 DIAGNOSIS — G510 Bell's palsy: Secondary | ICD-10-CM | POA: Diagnosis present

## 2012-03-01 DIAGNOSIS — I129 Hypertensive chronic kidney disease with stage 1 through stage 4 chronic kidney disease, or unspecified chronic kidney disease: Secondary | ICD-10-CM | POA: Diagnosis present

## 2012-03-01 DIAGNOSIS — R079 Chest pain, unspecified: Secondary | ICD-10-CM

## 2012-03-01 DIAGNOSIS — J4489 Other specified chronic obstructive pulmonary disease: Secondary | ICD-10-CM | POA: Diagnosis present

## 2012-03-01 DIAGNOSIS — I2510 Atherosclerotic heart disease of native coronary artery without angina pectoris: Secondary | ICD-10-CM | POA: Diagnosis present

## 2012-03-01 DIAGNOSIS — E119 Type 2 diabetes mellitus without complications: Secondary | ICD-10-CM | POA: Diagnosis present

## 2012-03-01 DIAGNOSIS — I200 Unstable angina: Secondary | ICD-10-CM

## 2012-03-01 DIAGNOSIS — E785 Hyperlipidemia, unspecified: Secondary | ICD-10-CM | POA: Diagnosis present

## 2012-03-01 DIAGNOSIS — I498 Other specified cardiac arrhythmias: Secondary | ICD-10-CM | POA: Diagnosis present

## 2012-03-01 DIAGNOSIS — J449 Chronic obstructive pulmonary disease, unspecified: Secondary | ICD-10-CM | POA: Diagnosis present

## 2012-03-01 DIAGNOSIS — K219 Gastro-esophageal reflux disease without esophagitis: Secondary | ICD-10-CM | POA: Diagnosis present

## 2012-03-01 DIAGNOSIS — N182 Chronic kidney disease, stage 2 (mild): Secondary | ICD-10-CM | POA: Diagnosis present

## 2012-03-01 DIAGNOSIS — Z9861 Coronary angioplasty status: Secondary | ICD-10-CM

## 2012-03-01 DIAGNOSIS — Z951 Presence of aortocoronary bypass graft: Secondary | ICD-10-CM

## 2012-03-01 DIAGNOSIS — R0789 Other chest pain: Principal | ICD-10-CM | POA: Diagnosis present

## 2012-03-01 HISTORY — DX: Acute myocardial infarction, unspecified: I21.9

## 2012-03-01 HISTORY — DX: Gastro-esophageal reflux disease without esophagitis: K21.9

## 2012-03-01 HISTORY — PX: LEFT HEART CATHETERIZATION WITH CORONARY/GRAFT ANGIOGRAM: SHX5450

## 2012-03-01 HISTORY — DX: Unspecified osteoarthritis, unspecified site: M19.90

## 2012-03-01 LAB — GLUCOSE, CAPILLARY
Glucose-Capillary: 99 mg/dL (ref 70–99)
Glucose-Capillary: 225 mg/dL — ABNORMAL HIGH (ref 70–99)

## 2012-03-01 SURGERY — LEFT HEART CATHETERIZATION WITH CORONARY/GRAFT ANGIOGRAM
Anesthesia: LOCAL

## 2012-03-01 MED ORDER — ONDANSETRON HCL 4 MG/2ML IJ SOLN
4.0000 mg | Freq: Four times a day (QID) | INTRAMUSCULAR | Status: DC | PRN
  Administered 2012-03-02: 4 mg via INTRAVENOUS

## 2012-03-01 MED ORDER — MIDAZOLAM HCL 2 MG/2ML IJ SOLN
INTRAMUSCULAR | Status: AC
  Filled 2012-03-01: qty 2

## 2012-03-01 MED ORDER — ACETAMINOPHEN 325 MG PO TABS
650.0000 mg | ORAL_TABLET | ORAL | Status: DC | PRN
  Administered 2012-03-01 – 2012-03-02 (×2): 650 mg via ORAL
  Filled 2012-03-01: qty 2

## 2012-03-01 MED ORDER — ASPIRIN EC 81 MG PO TBEC
81.0000 mg | DELAYED_RELEASE_TABLET | Freq: Every day | ORAL | Status: DC
  Administered 2012-03-02: 81 mg via ORAL
  Filled 2012-03-01: qty 1

## 2012-03-01 MED ORDER — METOPROLOL TARTRATE 25 MG PO TABS
25.0000 mg | ORAL_TABLET | Freq: Two times a day (BID) | ORAL | Status: DC
  Administered 2012-03-01 – 2012-03-02 (×2): 25 mg via ORAL
  Filled 2012-03-01 (×4): qty 1

## 2012-03-01 MED ORDER — ONDANSETRON HCL 4 MG/2ML IJ SOLN
4.0000 mg | Freq: Four times a day (QID) | INTRAMUSCULAR | Status: DC | PRN
  Filled 2012-03-01: qty 2

## 2012-03-01 MED ORDER — SODIUM CHLORIDE 0.9 % IJ SOLN: 3.0000 mL | INTRAMUSCULAR | Status: DC | PRN

## 2012-03-01 MED ORDER — MAGNESIUM HYDROXIDE 400 MG/5ML PO SUSP
30.0000 mL | Freq: Every day | ORAL | Status: DC | PRN
  Administered 2012-03-01: 30 mL via ORAL
  Filled 2012-03-01: qty 30

## 2012-03-01 MED ORDER — NITROGLYCERIN 0.4 MG SL SUBL: 0.4000 mg | SUBLINGUAL_TABLET | SUBLINGUAL | Status: DC | PRN

## 2012-03-01 MED ORDER — SODIUM CHLORIDE 0.9 % IV SOLN
INTRAVENOUS | Status: DC
  Administered 2012-03-01: 14:00:00 via INTRAVENOUS

## 2012-03-01 MED ORDER — GLIMEPIRIDE 4 MG PO TABS
4.0000 mg | ORAL_TABLET | Freq: Every day | ORAL | Status: DC
  Administered 2012-03-02: 4 mg via ORAL
  Filled 2012-03-01 (×2): qty 1

## 2012-03-01 MED ORDER — SODIUM CHLORIDE 0.9 % IV SOLN: 250.0000 mL | INTRAVENOUS | Status: DC | PRN

## 2012-03-01 MED ORDER — ACETAMINOPHEN 325 MG PO TABS: 650.0000 mg | ORAL_TABLET | ORAL | Status: DC | PRN

## 2012-03-01 MED ORDER — NITROGLYCERIN 0.2 MG/ML ON CALL CATH LAB
INTRAVENOUS | Status: AC
  Filled 2012-03-01: qty 1

## 2012-03-01 MED ORDER — LIDOCAINE HCL (PF) 1 % IJ SOLN
INTRAMUSCULAR | Status: AC
  Filled 2012-03-01: qty 30

## 2012-03-01 MED ORDER — SODIUM CHLORIDE 0.9 % IV SOLN: 250.0000 mL | INTRAVENOUS | Status: DC

## 2012-03-01 MED ORDER — HEPARIN (PORCINE) IN NACL 100-0.45 UNIT/ML-% IJ SOLN
1200.0000 [IU]/h | INTRAMUSCULAR | Status: DC
  Filled 2012-03-01: qty 250

## 2012-03-01 MED ORDER — HEPARIN (PORCINE) IN NACL 2-0.9 UNIT/ML-% IJ SOLN
INTRAMUSCULAR | Status: AC
  Filled 2012-03-01: qty 2000

## 2012-03-01 MED ORDER — PANTOPRAZOLE SODIUM 40 MG PO TBEC
40.0000 mg | DELAYED_RELEASE_TABLET | Freq: Every day | ORAL | Status: DC
  Administered 2012-03-01: 40 mg via ORAL
  Filled 2012-03-01: qty 1

## 2012-03-01 MED ORDER — SODIUM CHLORIDE 0.9 % IJ SOLN
3.0000 mL | Freq: Two times a day (BID) | INTRAMUSCULAR | Status: DC
  Administered 2012-03-01: 3 mL via INTRAVENOUS

## 2012-03-01 MED ORDER — GEMFIBROZIL 600 MG PO TABS
600.0000 mg | ORAL_TABLET | Freq: Two times a day (BID) | ORAL | Status: DC
  Administered 2012-03-02: 600 mg via ORAL
  Filled 2012-03-01 (×4): qty 1

## 2012-03-01 MED ORDER — FENTANYL CITRATE 0.05 MG/ML IJ SOLN
INTRAMUSCULAR | Status: AC
  Filled 2012-03-01: qty 2

## 2012-03-01 MED ORDER — SODIUM CHLORIDE 0.9 % IJ SOLN
3.0000 mL | Freq: Two times a day (BID) | INTRAMUSCULAR | Status: DC
  Administered 2012-03-02: 3 mL via INTRAVENOUS

## 2012-03-01 MED ORDER — ASPIRIN 81 MG PO CHEW: 324.0000 mg | CHEWABLE_TABLET | ORAL | Status: DC

## 2012-03-01 MED ORDER — SODIUM CHLORIDE 0.9 % IV SOLN
1.0000 mL/kg/h | INTRAVENOUS | Status: AC
  Administered 2012-03-01: 1 mL/kg/h via INTRAVENOUS

## 2012-03-01 NOTE — Progress Notes (Signed)
ANTICOAGULATION CONSULT NOTE - Initial Consult  Pharmacy Consult for heparin Indication: chest pain/ACS  Allergies  Allergen Reactions  . Codeine Other (See Comments)    GI     Patient Measurements: weight 84 kg, height 5'9"   Vital Signs:    Labs: No results found for this basename: HGB:2,HCT:3,PLT:3,APTT:3,LABPROT:3,INR:3,HEPARINUNFRC:3,CREATININE:3,CKTOTAL:3,CKMB:3,TROPONINI:3 in the last 72 hours  The CrCl is unknown because both a height and weight (above a minimum accepted value) are required for this calculation.   Medical History: Past Medical History  Diagnosis Date  . Hypertension   . Diabetes mellitus   . COPD (chronic obstructive pulmonary disease)   . Dyslipidemia   . Coronary artery disease     s/p recent non-ST elevation MI. Had three vessel bypass grafting with a LIMA to LAD,saphenous vein graft to his RCA,and saphenous vein graft to his PDA    Medications:  See med rec  Assessment: Patient is a 61 y.o M  with known CAD who was transferred to [facility] from [facility] for cardiac cath procedure.  Heparin was started at [facility] on 9/4 with heparin level drawn this morning therapeutic at 0.63 with rate of 1200 units/hr.  Goal of Therapy:  Heparin level 0.3-0.7 units/ml Monitor platelets by anticoagulation protocol: Yes   Plan:  1) continue current heparin rate at 1200 units/hr 2) f/u after cath   Foye Haggart P 03/01/2012,1:59 PM

## 2012-03-01 NOTE — H&P (Signed)
  NAME:  [name], [name] ROOM: 210  UNIT NUMBER:  [id number] LOCATION: 31F 210 01 ADM/VISIT DATE:  02/29/2012   ADM PHYSKathaleen Grinder:  [id number] DOB: 1951/01/12  A full cardiology consultation dictation was done at [facility], however was not available to be transported into Epic at this time. A hard copy of the encounter was sent with the patient.  REQUESTING PHYSICIAN:  Dr. Kirstie Peri  PRIMARY CARDIOLOGIST:  Dr. Michelle Piper Degent  REASON FOR CONSULTATION:  Recurrent chest pain.  HISTORY OF PRESENT ILLNESS:  The patient was seen and examined, records were reviewed, and plan discussed with [name].  Please refer to the full consultation note for details.  In summary, [name] is a 61 year old male with a history of multivessel CAD status post CABG in July of 2012, subsequent cardiac catheterization demonstrating graft disease necessitating DES to the vein graft to the posterolateral branch and the native RCA.  Additional problems include diabetes mellitus, mild renal insufficiency, and current creatinine 1.3.  He reports being in his usual state of health, typically walking up to 5 miles a day, although within the last week he has been experiencing recurrent exertional chest pain similar to previous angina symptoms, also some nausea.  He was admitted to the hospital for further observation, has ruled out with normal cardiac markers.  However, has continued to have rest chest pain symptoms requiring nitroglycerin.  PHYSICAL EXAMINATION:  Vital signs:  Temperature 98.5 degrees, blood pressure 113/74, heart rate 68, respirations 15, oxygen saturation 94% on room air.  Weight is 196 pounds.  General:  The patient is comfortable and in no acute distress.  Lungs:  Clear without labored breathing.  Cardiac:  Regular rate and rhythm, no significant rub or gallop, no significant murmur.  There is a left femoral bruit.  Otherwise, no pitting edema.  LABORATORY DATA:  Glucose 64, BUN 23, creatinine 1.3, AST 26, ALT 25,  sodium 132, potassium 3.3, chloride 96, bicarb 25.  Peak troponin 0.01.  INR 1.0.  BNP 152.  WBC 8800, hemoglobin 13.2, hematocrit 39.1, platelets 237,000.  Chest x-ray reports no acute process.  ECG shows sinus bradycardia at 52 beats per minute with right bundle branch block and left anterior fascicular block.  IMPRESSION:  The patient presents with unstable angina, normal cardiac markers at this point, although recurring chest pain at rest requiring nitroglycerin.  PLAN:  Situation reviewed with the patient and his wife.  We will continue medical therapy including the addition of heparin and he will be transferred to [facility] in anticipation of a cardiac catheterization to assess native anatomy, graft anatomy, and stent patency for evaluation of possible revascularization options.  Risks and benefits were discussed and he is in agreement to proceed.   __________________________    Nona Dell, M.D. Alinda Money D: 03/01/2012 1116 T: 03/01/2012 1129 P: MCD1  cc:  Kirstie Peri, M.D.

## 2012-03-01 NOTE — Progress Notes (Addendum)
Pt arrived to floor around 1400 c/o 6/10 sharp, crushing left sided CP; EKG obtained, no changes noted; VSS, CP relieved by 3 SL NTG; Ward Givens, NP paged and made aware; currently pt lying bed with no more c/o CP since

## 2012-03-01 NOTE — CV Procedure (Signed)
   Cardiac Catheterization Procedure Note  Name: [name] MRN: [id number] DOB: 02-Feb-1951  Procedure: Left Heart Cath, Selective Coronary Angiography, saphenous vein graft angiography, LIMA angiography, LV angiography  Indication: Chest pain in gentleman with known coronary artery disease status post CABG and PCI. His chest pain symptoms have been typical of his past angina. He has ruled out for myocardial infarction and presents for cardiac catheterization and possible PCI to   Procedural details: The right groin was prepped, draped, and anesthetized with 1% lidocaine. Using modified Seldinger technique, a 5 French sheath was introduced into the right femoral artery. Standard Judkins catheters were used for coronary angiography, saphenous vein graft angiography, LIMA angiography, and left ventriculography. Catheter exchanges were performed over a guidewire. There were no immediate procedural complications. The patient was transferred to the post catheterization recovery area for further monitoring.  Procedural Findings: Hemodynamics:  AO 142/73 LV 148/18   Coronary angiography: Coronary dominance: right  Left mainstem: Patent with no obstructive disease  Left anterior descending (LAD): The proximal LAD is patent. Just after the first septal perforator there is 75% tubular stenosis involving the origin of 2 diagonal branches. The first diagonal is moderate in caliber and it has a 75% ostial stenosis as well as diffuse proximal stenosis. The second diagonal is small in caliber and it as a tight 95% ostial stenosis. The LAD is diffusely diseased throughout that area, but then opens up in the mid and distal vessel with diffuse nonobstructive stenosis. The LIMA fills retrograde from the insertion site at the coronary.  Left circumflex (LCx): The left circumflex is widely patent. There is mild nonobstructive stenosis in the proximal portion of the first OM. The first OM is moderate to large  in caliber. The second and third OM branches are small in caliber   Right coronary artery (RCA): This is a large, dominant vessel. The vessel is patent, but has slow flow. The proximal to mid vessel has 40% stenosis. There is a stented segment at the junction of the mid and distal vessel and this is patent. The PDA branch is patent. The posterolateral branch is occluded. There is a hypodensity in the distal portion of the vessel but this was present on the previous study. I think this is due to vessel ectasia.  LIMA to LAD: Patent graft but small and beyond its insertion site the LAD fills primarily from native flow.  Saphenous vein graft to PDA: Totally occluded  Saphenous vein graft to PLA: Patent with continued wide patency of the stented segment. There is some retrograde filling of the PDA branch.  Left ventriculography: The inferior wall is hypokinetic. The estimated left ventricular ejection fraction is 45%. There is no significant mitral regurgitation visualized.  Final Conclusions:   1. Moderately tight proximal to mid LAD stenosis with continued patency of the LIMA graft 2. Minimal stenosis in the left circumflex 3. Continued patency of the stented segment in the native right coronary artery 4. Continued patency of the saphenous vein graft to PLA as well as the stented segment in that vein graft 5. 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 Mild segmental LV dysfunction with estimated left ventricular ejection fraction 45%  Recommendations: Continue medical therapy.  Tonny Bollman 03/01/2012, 6:32 PM

## 2012-03-02 ENCOUNTER — Encounter (HOSPITAL_COMMUNITY): Payer: Self-pay | Admitting: Cardiology

## 2012-03-02 DIAGNOSIS — I2510 Atherosclerotic heart disease of native coronary artery without angina pectoris: Secondary | ICD-10-CM

## 2012-03-02 DIAGNOSIS — I200 Unstable angina: Secondary | ICD-10-CM

## 2012-03-02 LAB — BASIC METABOLIC PANEL
Chloride: 100 mEq/L (ref 96–112)
GFR calc Af Amer: 69 mL/min — ABNORMAL LOW (ref >90)
GFR calc non Af Amer: 60 mL/min — ABNORMAL LOW (ref >90)
Glucose, Bld: 128 mg/dL — ABNORMAL HIGH (ref 70–99)
Potassium: 4.8 mEq/L (ref 3.5–5.1)
Sodium: 138 mEq/L (ref 135–145)

## 2012-03-02 LAB — GLUCOSE, CAPILLARY
Glucose-Capillary: 74 mg/dL (ref 70–99)
Glucose-Capillary: 155 mg/dL — ABNORMAL HIGH (ref 70–99)

## 2012-03-02 LAB — LIPID PANEL
HDL: 32 mg/dL — ABNORMAL LOW (ref >39)
Total CHOL/HDL Ratio: 5.6 RATIO
VLDL: 28 mg/dL (ref 0–40)

## 2012-03-02 MED ORDER — ISOSORBIDE MONONITRATE ER 30 MG PO TB24
30.0000 mg | ORAL_TABLET | Freq: Every day | ORAL | Status: DC
  Administered 2012-03-02: 30 mg via ORAL
  Filled 2012-03-02: qty 1

## 2012-03-02 MED ORDER — ROSUVASTATIN CALCIUM 40 MG PO TABS
40.0000 mg | ORAL_TABLET | Freq: Every day | ORAL | Status: DC
  Filled 2012-03-02: qty 1

## 2012-03-02 MED ORDER — ISOSORBIDE MONONITRATE ER 30 MG PO TB24: 30.0000 mg | ORAL_TABLET | Freq: Every day | ORAL | Status: DC

## 2012-03-02 MED ORDER — METFORMIN HCL 500 MG PO TABS: 1000.0000 mg | ORAL_TABLET | Freq: Two times a day (BID) | ORAL | Status: DC

## 2012-03-02 MED ORDER — ATORVASTATIN CALCIUM 80 MG PO TABS
80.0000 mg | ORAL_TABLET | Freq: Every day | ORAL | Status: DC
  Filled 2012-03-02: qty 1

## 2012-03-02 MED ORDER — CLOPIDOGREL BISULFATE 75 MG PO TABS
75.0000 mg | ORAL_TABLET | Freq: Every day | ORAL | Status: DC
  Administered 2012-03-02: 75 mg via ORAL
  Filled 2012-03-02: qty 1

## 2012-03-02 MED ORDER — INSULIN ASPART 100 UNIT/ML ~~LOC~~ SOLN
0.0000 [IU] | Freq: Three times a day (TID) | SUBCUTANEOUS | Status: DC
  Administered 2012-03-02: 2 [IU] via SUBCUTANEOUS

## 2012-03-02 MED ORDER — INSULIN ASPART 100 UNIT/ML ~~LOC~~ SOLN: 0.0000 [IU] | Freq: Every day | SUBCUTANEOUS | Status: DC

## 2012-03-02 NOTE — Progress Notes (Addendum)
Patient C/O 8/10 headache and nausea.  Patient given PRN medication for pain and nausea.  Patient's wife called and updated on patient's situation.  PA notified.  Will continue to monitor until patient's ride arrives for discharge. [name]

## 2012-03-02 NOTE — Progress Notes (Signed)
DC orders received.  Patient stable with no S/S of distress.  Discharge medications and instructions reviewed with patient and patient's wife (via telephone).  Patient DC home. Nolon Nations

## 2012-03-02 NOTE — Discharge Summary (Signed)
Discharge Summary   Patient ID: [name] MRN: [id number], DOB/AGE: 1951-06-21 61 y.o.  Primary MD: Kirstie Peri, MD Primary Cardiologist: [place]  Admit date: 03/01/2012 D/C date:     03/02/2012      Primary Discharge Diagnoses:  1. Chest pain  - No objective evidence of cardiac ischemia  - Cath showed no new lesion requiring intervention  - Imdur added  - Continued ASA, BB, statin, Plavix (has been on for one year s/p PCI)  2. Hyperlipidemia  - LDL 120 on 40mg  Crestor and Gemfibrozil, goal <70  - Discussed lifestyle changes, F/u with PCP  3. Elevated Crt  - Crt  0.92-->1.29 post cath  - F/u BMET next tuesday   Secondary Discharge Diagnoses:  1. Coronary Artery Disease - s/p CABG 12/2010, s/p PTCA/DES to distal SVG to PLA & PTCA/DES x 2 native mid/distal RCA 02/2011 2. Bradycardia 3. Diabetes Mellitus, Type 2 4. Hypertension 5. COPD 6. Bells Palsy 7. GERD 8. Arthritis   Allergies Allergies  Allergen Reactions  . Codeine Other (See Comments)    GI     Diagnostic Studies/Procedures:   03/02/12 - Cardiac Cath  Procedural Findings:  Hemodynamics:  AO 142/73  LV 148/18  Coronary angiography:  Coronary dominance: right  Left mainstem: Patent with no obstructive disease  Left anterior descending (LAD): The proximal LAD is patent. Just after the first septal perforator there is 75% tubular stenosis involving the origin of 2 diagonal branches. The first diagonal is moderate in caliber and it has a 75% ostial stenosis as well as diffuse proximal stenosis. The second diagonal is small in caliber and it as a tight 95% ostial stenosis. The LAD is diffusely diseased throughout that area, but then opens up in the mid and distal vessel with diffuse nonobstructive stenosis. The LIMA fills retrograde from the insertion site at the coronary.  Left circumflex (LCx): The left circumflex is widely patent. There is mild nonobstructive stenosis in the proximal portion of the  first OM. The first OM is moderate to large in caliber. The second and third OM branches are small in caliber  Right coronary artery (RCA): This is a large, dominant vessel. The vessel is patent, but has slow flow. The proximal to mid vessel has 40% stenosis. There is a stented segment at the junction of the mid and distal vessel and this is patent. The PDA branch is patent. The posterolateral branch is occluded. There is a hypodensity in the distal portion of the vessel but this was present on the previous study. I think this is due to vessel ectasia.  LIMA to LAD: Patent graft but small and beyond its insertion site the LAD fills primarily from native flow.  Saphenous vein graft to PDA: Totally occluded  Saphenous vein graft to PLA: Patent with continued wide patency of the stented segment. There is some retrograde filling of the PDA branch.  Left ventriculography: The inferior wall is hypokinetic. The estimated left ventricular ejection fraction is 45%. There is no significant mitral regurgitation visualized.  Final Conclusions:  1. Moderately tight proximal to mid LAD stenosis with continued patency of the LIMA graft  2. Minimal stenosis in the left circumflex  3. Continued patency of the stented segment in the native right coronary artery  4. Continued patency of the saphenous vein graft to PLA as well as the stented segment in that vein graft  5. 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 Mild segmental LV  dysfunction with estimated left ventricular ejection fraction 45%  Recommendations: Continue medical therapy.   History of Present Illness: 61 y.o. male w/ the above medical problems who transferred from [facility] to [facility] on 03/01/12 with complaints of chest pain concerning for unstable angina.  He reported being in his usual state of health, typically walking up to 5 miles a day, although within the last week he had been experiencing recurrent  exertional chest pain similar to previous angina symptoms prompting him to seek medical evaluation.  Hospital Course: At [facility], EKG revealed sinus bradycardia 52bpm, RBBB, LAFB, no acute ST/T changes. CXR was without acute cardiopulmonary abnormalities. Labs were significant for normal cardiac enzymes. He had recurrent chest pain at rest requiring NTG and was therefore placed on IV heparin and transferred to [facility] for further evaluation and treatment.   He arrived to [facility] in stable condition. Cardiac cath on 03/01/12 showed continued patency of LIMA, SVG to PLA, patent stents in RCA and SVG, chronic occlusion of SVG to PDA, otherwise minimal stenosis of LCx. There were no new lesions requiring intervention and recommendations were made for continued medical therapy. He tolerated the procedure well without complications. Imdur was added. Crt increased slightly to 1.29. Will check BMET next week. LDL was noted to be elevated at 120 (goal <70). He is on Crestor 40mg  and Gemfibrozil. Discussed improved lifestyle changes.  He was able to ambulate without chest pain or sob. Cath site remained stable. He was seen and evaluated by Dr. Tenny Craw who felt he was stable for discharge home with plans for follow up as scheduled below.  Discharge Vitals: Blood pressure 146/79, pulse 58, temperature 98 F (36.7 C), temperature source Oral, resp. rate 18, height 5\' 8"  (1.727 m), weight 186 lb (84.369 kg), SpO2 98.00%.  Labs: Component Value Date   WBC 7.9 03/03/2011   HGB 11.4* 03/03/2011   HCT 36.3* 03/03/2011   MCV 77.6* 03/03/2011   PLT 209 03/03/2011    Lab 03/02/12 0623  NA 138  K 4.8  CL 100  CO2 29  BUN 17  CREATININE 1.29  CALCIUM 9.6  GLUCOSE 128*   Component Value Date   CHOL 180 03/02/2012   HDL 32* 03/02/2012   LDLCALC 120* 03/02/2012   TRIG 140 03/02/2012     02/28/2011 03:57  CK, MB 1.9  CK Total 50  Troponin I <0.30    Discharge Medications   Medication List  As of 03/02/2012  11:00 AM   TAKE these medications         acetaminophen 500 MG tablet   Commonly known as: TYLENOL   Take 1,000 mg by mouth every 6 (six) hours as needed. (for chest wall pain)      aspirin 81 MG EC tablet   Take 81 mg by mouth daily.      clopidogrel 75 MG tablet   Commonly known as: PLAVIX   Take 75 mg by mouth daily.      docusate sodium 100 MG capsule   Commonly known as: COLACE   Take 200 mg by mouth at bedtime.      FISH OIL PO   Take 1 capsule by mouth 2 (two) times daily.      gemfibrozil 600 MG tablet   Commonly known as: LOPID   Take 600 mg by mouth 2 (two) times daily before a meal.      glimepiride 4 MG tablet   Commonly known as: AMARYL  Take 4 mg by mouth 2 (two) times daily.      IRON SUPPLEMENT 325 (65 FE) MG tablet   Generic drug: ferrous sulfate   Take 1 mg by mouth 3 (three) times daily with meals.      isosorbide mononitrate 30 MG 24 hr tablet   Commonly known as: IMDUR   Take 1 tablet (30 mg total) by mouth daily.      metFORMIN 500 MG tablet   Commonly known as: GLUCOPHAGE   Take 2 tablets (1,000 mg total) by mouth 2 (two) times daily with a meal. Please hold for 48hrs after you heart cath. Resume on 03/04/12      metoprolol tartrate 25 MG tablet   Commonly known as: LOPRESSOR   Take 25 mg by mouth 2 (two) times daily.      multivitamin tablet   Take 1 tablet by mouth daily.      nitroGLYCERIN 0.4 MG SL tablet   Commonly known as: NITROSTAT   Place 0.4 mg under the tongue every 5 (five) minutes as needed. For chest pain      pantoprazole 40 MG tablet   Commonly known as: PROTONIX   Take 40 mg by mouth daily.      rosuvastatin 40 MG tablet   Commonly known as: CRESTOR   Take 40 mg by mouth daily.            Disposition   Discharge Orders    Future Appointments: Provider: Department: Dept Phone: Center:   03/27/2012 12:00 PM Rollene Rotunda, MD [facility] [phone] [facility]     Future Orders Please Complete By  Expires   Diet - low sodium heart healthy      Increase activity slowly      Discharge instructions      Comments:   * KEEP GROIN SITE CLEAN AND DRY. Call the office for any signs of bleedings, pus, swelling, increased pain, or any other concerns. * NO HEAVY LIFTING (>10lbs) OR SEXUAL ACTIVITY X 7 DAYS. * NO DRIVING X 2-3 DAYS. * NO SOAKING BATHS, HOT TUBS, POOLS, ETC., X 7 DAYS.  * You will need to have follow up blood work (BMET) on 03/06/12 at [facility] to check your kidney function. Please have results sent to [facility] in [place]  * Please hold Metformin for 48hrs after you heart cath. Resume on 03/04/12  * Please increase your physical activity and focus on heart healthy diet in order to improve your cholesterol.  **PLEASE REMEMBER TO BRING ALL OF YOUR MEDICATIONS TO EACH OF YOUR FOLLOW-UP OFFICE VISITS.     Follow-up Information    Follow up with Rollene Rotunda, MD on 03/27/2012. (12:00)    Contact information:   [facility] [address] [place] [address] [phone]        Follow up with [facility], MD.   Contact information:   [address]  [place] [address] [phone]           Outstanding Labs/Studies:  1. BMET  Duration of Discharge Encounter: Greater than 30 minutes including physician and PA time.  Signed, Brette Cast PA-C 03/02/2012, 11:00 AM

## 2012-03-02 NOTE — Progress Notes (Signed)
Cardiology Progress Note Patient Name: [name] Date of Encounter: 03/02/2012, 7:25 AM     Subjective  No overnight events. Patient denies chest pain or sob. Feels ready to go home   Objective   Telemetry: sinus rhythm 40-60s, freq PVCs  Medications: . aspirin EC  81 mg Oral Daily  . fentaNYL      . gemfibrozil  600 mg Oral BID AC  . glimepiride  4 mg Oral Q breakfast  . heparin      . isosorbide mononitrate  30 mg Oral Daily  . lidocaine      . metoprolol tartrate  25 mg Oral BID  . midazolam      . nitroGLYCERIN      . pantoprazole  40 mg Oral Q1200  . sodium chloride  3 mL Intravenous Q12H   . sodium chloride 75 mL/hr at 03/01/12 1419    Physical Exam: Temp:  [98 F (36.7 C)-98.5 F (36.9 C)] 98 F (36.7 C) (09/06 0500) Pulse Rate:  [49-65] 52  (09/06 0500) Resp:  [18] 18  (09/06 0500) BP: (116-155)/(60-106) 116/78 mmHg (09/06 0500) SpO2:  [97 %-99 %] 98 % (09/06 0500) Weight:  [186 lb (84.369 kg)] 186 lb (84.369 kg) (09/05 1404)  General: Well developed white male, in no acute distress. Head: Normocephalic, atraumatic, sclera non-icteric, nares are without discharge.  Neck: Supple. Negative for carotid bruits or JVD Lungs: Clear bilaterally to auscultation without wheezes, rales, or rhonchi. Breathing is unlabored. Heart: RRR S1 S2 without murmurs, rubs, or gallops.  Abdomen: Soft, non-tender, non-distended with normoactive bowel sounds. No rebound/guarding. No obvious abdominal masses. Msk:  Strength and tone appear normal for age. Extremities: No edema. No clubbing or cyanosis. Distal pedal pulses are intact and equal bilaterally. Neuro: Alert and oriented X 3. Moves all extremities spontaneously. Psych:  Responds to questions appropriately with a normal affect.   Intake/Output Summary (Last 24 hours) at 03/02/12 0725 Last data filed at 03/01/12 1600  Gross per 24 hour  Intake      0 ml  Output    400 ml  Net   -400 ml    Labs:    03/02/2012 06:23  Sodium 138  Potassium 4.8  Chloride 100  CO2 29  BUN 17  Creatinine 1.29  Calcium 9.6  GFR calc non Af Amer 60 (L)  GFR calc Af Amer 69 (L)  Glucose 128 (H)     03/02/2012 06:23  Cholesterol 180  Triglycerides 140  HDL 32 (L)  LDL (calc) 120 (H)  VLDL 28  Total CHOL/HDL Ratio 5.6    Radiology/Studies:   03/02/12 - Cardiac Cath Procedural Findings:  Hemodynamics:  AO 142/73  LV 148/18  Coronary angiography:  Coronary dominance: right  Left mainstem: Patent with no obstructive disease  Left anterior descending (LAD): The proximal LAD is patent. Just after the first septal perforator there is 75% tubular stenosis involving the origin of 2 diagonal branches. The first diagonal is moderate in caliber and it has a 75% ostial stenosis as well as diffuse proximal stenosis. The second diagonal is small in caliber and it as a tight 95% ostial stenosis. The LAD is diffusely diseased throughout that area, but then opens up in the mid and distal vessel with diffuse nonobstructive stenosis. The LIMA fills retrograde from the insertion site at the coronary.  Left circumflex (LCx): The left circumflex is widely patent. There is mild nonobstructive stenosis in the proximal portion  of the first OM. The first OM is moderate to large in caliber. The second and third OM branches are small in caliber  Right coronary artery (RCA): This is a large, dominant vessel. The vessel is patent, but has slow flow. The proximal to mid vessel has 40% stenosis. There is a stented segment at the junction of the mid and distal vessel and this is patent. The PDA branch is patent. The posterolateral branch is occluded. There is a hypodensity in the distal portion of the vessel but this was present on the previous study. I think this is due to vessel ectasia.  LIMA to LAD: Patent graft but small and beyond its insertion site the LAD fills primarily from native flow.  Saphenous vein graft to PDA: Totally  occluded  Saphenous vein graft to PLA: Patent with continued wide patency of the stented segment. There is some retrograde filling of the PDA branch.  Left ventriculography: The inferior wall is hypokinetic. The estimated left ventricular ejection fraction is 45%. There is no significant mitral regurgitation visualized.  Final Conclusions:  1. Moderately tight proximal to mid LAD stenosis with continued patency of the LIMA graft  2. Minimal stenosis in the left circumflex  3. Continued patency of the stented segment in the native right coronary artery  4. Continued patency of the saphenous vein graft to PLA as well as the stented segment in that vein graft  5. 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 Mild segmental LV dysfunction with estimated left ventricular ejection fraction 45%  Recommendations: Continue medical therapy.    Assessment and Plan   1. Chest pain: Patient with h/o CAD transferred to [facility] with chest pain concerning for unstable angina. Underwent cardiac cath yesterday that showed continued patency of LIMA, SVG to PLA, patent stents in RCA and SVG, chronic occlusion of SVG to PDA, otherwise minimal stenosis of LCx. Recommendations for continued medical therapy. Will resume Crestor and Plavix and continue ASA and BB. Consider up titration of Imdur. Crt 0.92-->1.29.   2. Coronary Artery Disease: s/p CABG 12/2010, s/p DES to SVG to PL branch & native RCA. Plan as above  3. Sinus bradycardia: Asymptomatic  4. Diabetes Mellitus: Metformin on hold for cath. Cont Amaryl. Place on SSI.   5. Hyperlipidemia: LDL 120 (goal <70) on 40mg  Crestor and Gemfibrozil. Discussed lifestyle changes.  6. Hypertension: BP stable. Cont antihypertensives  Disposition: Likely home today pending evaluation by Dr. Tenny Craw    Signed, HOPE, JESSICA PA-C  Patient seen and examineed.  Agree with findings of J Hope.  Lung:  CTA  Cardiac  No edema.  Ext:  No edema.  R  groin without hematoma.No bruit. Patient with history of CAD.  Cath yesterday showed no new lesion requiring intervention.  Plan to continue medical Rx.  Increase Imdur.  WIll need close f/u of lipids as outpt.  Continue other meds  Home today.  Dietrich Pates 8:56 AM

## 2012-03-05 ENCOUNTER — Ambulatory Visit: Payer: PRIVATE HEALTH INSURANCE | Admitting: Cardiology

## 2012-03-13 NOTE — Progress Notes (Signed)
Utilization review completed- retro 

## 2012-03-27 ENCOUNTER — Encounter: Payer: PRIVATE HEALTH INSURANCE | Admitting: Cardiology

## 2012-03-28 ENCOUNTER — Other Ambulatory Visit: Payer: Self-pay | Admitting: Cardiology

## 2012-03-29 ENCOUNTER — Ambulatory Visit (INDEPENDENT_AMBULATORY_CARE_PROVIDER_SITE_OTHER): Payer: PRIVATE HEALTH INSURANCE | Admitting: Physician Assistant

## 2012-03-29 ENCOUNTER — Encounter: Payer: Self-pay | Admitting: Physician Assistant

## 2012-03-29 VITALS — BP 121/82 | HR 78 | Temp 98.4°F | Ht 67.0 in | Wt 193.0 lb

## 2012-03-29 DIAGNOSIS — E785 Hyperlipidemia, unspecified: Secondary | ICD-10-CM

## 2012-03-29 DIAGNOSIS — I2510 Atherosclerotic heart disease of native coronary artery without angina pectoris: Secondary | ICD-10-CM

## 2012-03-29 DIAGNOSIS — N289 Disorder of kidney and ureter, unspecified: Secondary | ICD-10-CM

## 2012-03-29 DIAGNOSIS — R079 Chest pain, unspecified: Secondary | ICD-10-CM

## 2012-03-29 MED ORDER — ISOSORBIDE MONONITRATE ER 60 MG PO TB24: 60.0000 mg | ORAL_TABLET | Freq: Every day | ORAL | Status: DC

## 2012-03-29 NOTE — Progress Notes (Signed)
Primary Cardiologist: Lewayne Bunting, MD   HPI: Post hospital followup from [facility], following transfer from [facility] for further evaluation of UAP with cardiac catheterization. Patient ruled out for MI with normal cardiac markers, prior to transfer.   - Cardiac catheterization: Moderately tight proximal/mid LAD with patent LIMA graft; patent native RCA stent; patent S.-PLA graft, as well as stented segment; known 100% S.-PDA graft occlusion; mild LVD (EF 45%).  Continued medical therapy was recommended, and Imdur 30 mg daily was added. LDL 120. Creatinine bumped up slightly to 1.3. Post hospital labs, 4 days later, indicated improvement in creatinine to 1.1.  Unfortunately, he reports today that he has continued to experience intermittent CP, despite the recent addition of Imdur. As noted in recent past, these symptoms can occur with/without activity. Prior to our examination, developed some CP and took 1 NTG tablet, with reported relief. He is currently pain-free and hemodynamically stable.   12-lead EKG today, reviewed by me, indicates NSR 73 bpm with chronic RBBB/LAHB, and no acute changes   Allergies  Allergen Reactions  . Codeine Other (See Comments)    GI     Current Outpatient Prescriptions  Medication Sig Dispense Refill  . acetaminophen (TYLENOL) 500 MG tablet Take 1,000 mg by mouth every 6 (six) hours as needed. (for chest wall pain)      . aspirin 81 MG EC tablet Take 81 mg by mouth daily.        . clopidogrel (PLAVIX) 75 MG tablet Take 75 mg by mouth daily.        . CRESTOR 40 MG tablet TAKE 1 TABLET EVERY BEDTIME  30 tablet  3  . docusate sodium (COLACE) 100 MG capsule Take 200 mg by mouth at bedtime.       . ferrous sulfate (IRON SUPPLEMENT) 325 (65 FE) MG tablet Take 1 mg by mouth 3 (three) times daily with meals.        Marland Kitchen gemfibrozil (LOPID) 600 MG tablet Take 600 mg by mouth 2 (two) times daily before a meal.        . glimepiride (AMARYL) 4 MG tablet Take 4 mg by mouth 2 (two) times  daily.        . isosorbide mononitrate (IMDUR) 60 MG 24 hr tablet Take 1 tablet (60 mg total) by mouth daily.  30 tablet  6  . metFORMIN (GLUCOPHAGE) 500 MG tablet Take 1,000 mg by mouth 2 (two) times daily with a meal.      . metoprolol tartrate (LOPRESSOR) 25 MG tablet Take 25 mg by mouth 2 (two) times daily.      . Multiple Vitamin (MULTIVITAMIN) tablet Take 1 tablet by mouth daily.        . nitroGLYCERIN (NITROSTAT) 0.4 MG SL tablet Place 0.4 mg under the tongue every 5 (five) minutes as needed. For chest pain       . Omega-3 Fatty Acids (FISH OIL PO) Take 1 capsule by mouth 2 (two) times daily.       . pantoprazole (PROTONIX) 40 MG tablet Take 40 mg by mouth daily.        Marland Kitchen DISCONTD: isosorbide mononitrate (IMDUR) 30 MG 24 hr tablet Take 1 tablet (30 mg total) by mouth daily.  30 tablet  6  . DISCONTD: metFORMIN (GLUCOPHAGE) 500 MG tablet Take 2 tablets (1,000 mg total) by mouth 2 (two) times daily with a meal. Please hold for 48hrs after you heart cath. Resume on 03/04/12  Past Medical History  Diagnosis Date  . Hypertension   . Diabetes mellitus   . COPD (chronic obstructive pulmonary disease)   . Dyslipidemia   . Coronary artery disease     s/p CABG 12/2010, s/p PTCA/DES to distal SVG to PLA & PTCA/DES x 2 native mid/distal RCA 02/2011; Cath 03/01/12 no new lesions for intervention, med therapy  . Neuromuscular disorder     bell palsey  . GERD (gastroesophageal reflux disease)   . Arthritis     Past Surgical History  Procedure Date  . Shoulder surgery     bilateral shoulder surgeries  . Appendectomy   . Cholecystectomy   . Tonsillectomy   . Coronary artery bypass graft 01/01/2011    PETER VAN TRIGT  . Shoulder surgery   . Rotator cuff repair   . Knee arthroscopy     History   Social History  . Marital Status: Married    Spouse Name: N/A    Number of Children: N/A  . Years of Education: N/A   Occupational History  . Not on file.   Social History Main Topics    . Smoking status: Former Smoker -- 1.0 packs/day for 50 years    Types: Cigarettes    Quit date: 12/26/2010  . Smokeless tobacco: Never Used  . Alcohol Use: No     drinks alcohol occasionally  . Drug Use: No  . Sexually Active: Yes   Other Topics Concern  . 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 Diabetes      family history of  . Hypertension      family history of  . Other      negative for premature coronary disease and coronary bypass surgery    ROS: no nausea, vomiting; no fever, chills; no melena, hematochezia; no claudication  PHYSICAL EXAM: BP 121/82  Pulse 78  Temp 98.4 F (36.9 C)  Ht 5\' 7"  (1.702 m)  Wt 193 lb (87.544 kg)  BMI 30.23 kg/m2 GENERAL: 61 year old male, mildly obese; NAD HEENT: NCAT, PERRLA, EOMI; sclera clear; no xanthelasma NECK: palpable bilateral carotid pulses, no bruits; no JVD; no TM LUNGS: CTA bilaterally CARDIAC: RRR (S1, S2); no significant murmurs; no rubs or gallops ABDOMEN: soft, non-tender; intact BS EXTREMETIES: intact distal pulses; no significant peripheral edema SKIN: warm/dry; no obvious rash/lesions MUSCULOSKELETAL: no joint deformity NEURO: no focal deficit; NL affect   EKG: reviewed and available in Electronic Records  ASSESSMENT & PLAN:  CAD (coronary artery disease) Will increase Imdur to 60 mg daily, and check troponin I level today. If this is negative, recommendation is to continue aggressive medical management as an outpatient, and have patient return to see me next week for continued close monitoring. If on the other hand the troponin is abnormal, then patient will require immediate hospitalization and treatment for UAP. Of note, we reviewed the results of the recent cardiac catheterization, and the recommendation to continue aggressive medical management.  Renal insufficiency Recent  mild elevation in creatinine, since resolved by followup labs.  Diabetes mellitus Followed by primary M.D.  Dyslipidemia Recent LDL 120. Continue full dose Crestor. Target LDL 70 or less, if feasible.    [name], PAC

## 2012-03-29 NOTE — Patient Instructions (Signed)
   Increase Imdur to 60mg  daily Continue all other current medications. Lab - today - troponin Office will contact with results Follow up in  1 week

## 2012-03-29 NOTE — Assessment & Plan Note (Signed)
Recent LDL 120. Continue full dose Crestor. Target LDL 70 or less, if feasible.

## 2012-03-29 NOTE — Assessment & Plan Note (Addendum)
Will increase Imdur to 60 mg daily, and check troponin I level today. If this is negative, recommendation is to continue aggressive medical management as an outpatient, and have patient return to see me next week for continued close monitoring. If on the other hand the troponin is abnormal, then patient will require immediate hospitalization and treatment for UAP. Of note, we reviewed the results of the recent cardiac catheterization, and the recommendation to continue aggressive medical management.

## 2012-03-29 NOTE — Assessment & Plan Note (Signed)
Followed by primary M.D. 

## 2012-03-29 NOTE — Assessment & Plan Note (Signed)
Recent mild elevation in creatinine, since resolved by followup labs.

## 2012-03-30 ENCOUNTER — Telehealth: Payer: Self-pay | Admitting: *Deleted

## 2012-03-30 NOTE — Telephone Encounter (Signed)
Message copied by Lesle Chris on Fri Mar 30, 2012 11:53 AM ------      Message from: Rande Brunt      Created: Fri Mar 30, 2012 11:11 AM       NL troponin. No further w/u

## 2012-03-30 NOTE — Telephone Encounter (Signed)
Notes Recorded by Lesle Chris, LPN on 16/06/958 at 11:53 AM Wife Harriett Sine) notified of below. Notes Recorded by Lesle Chris, LPN on 45/09/979 at 11:37 AM Left message to return call.

## 2012-04-04 ENCOUNTER — Ambulatory Visit: Payer: PRIVATE HEALTH INSURANCE | Admitting: Physician Assistant

## 2012-04-05 ENCOUNTER — Telehealth: Payer: Self-pay | Admitting: Physician Assistant

## 2012-04-05 ENCOUNTER — Ambulatory Visit: Payer: PRIVATE HEALTH INSURANCE | Admitting: Physician Assistant

## 2012-04-05 NOTE — Telephone Encounter (Signed)
Mrs. Moskal called and states that Equan wants to cancel his appointment today no reason was given. States that he will Call back at a later date.

## 2012-04-24 ENCOUNTER — Other Ambulatory Visit: Payer: Self-pay | Admitting: Cardiology

## 2012-06-26 ENCOUNTER — Encounter: Payer: Self-pay | Admitting: Cardiology

## 2012-07-17 ENCOUNTER — Ambulatory Visit: Payer: PRIVATE HEALTH INSURANCE | Admitting: Cardiovascular Disease

## 2012-07-24 ENCOUNTER — Ambulatory Visit: Payer: PRIVATE HEALTH INSURANCE | Admitting: Cardiovascular Disease

## 2012-08-23 IMAGING — CR DG CHEST 2V
2 series · 2 of 2 positions shown · non-contrast
Comparison: 08/02/2005

CLINICAL DATA: Coronary artery disease, preoperative assessment for
CABG

CHEST - 2 VIEW

[w chest pa]
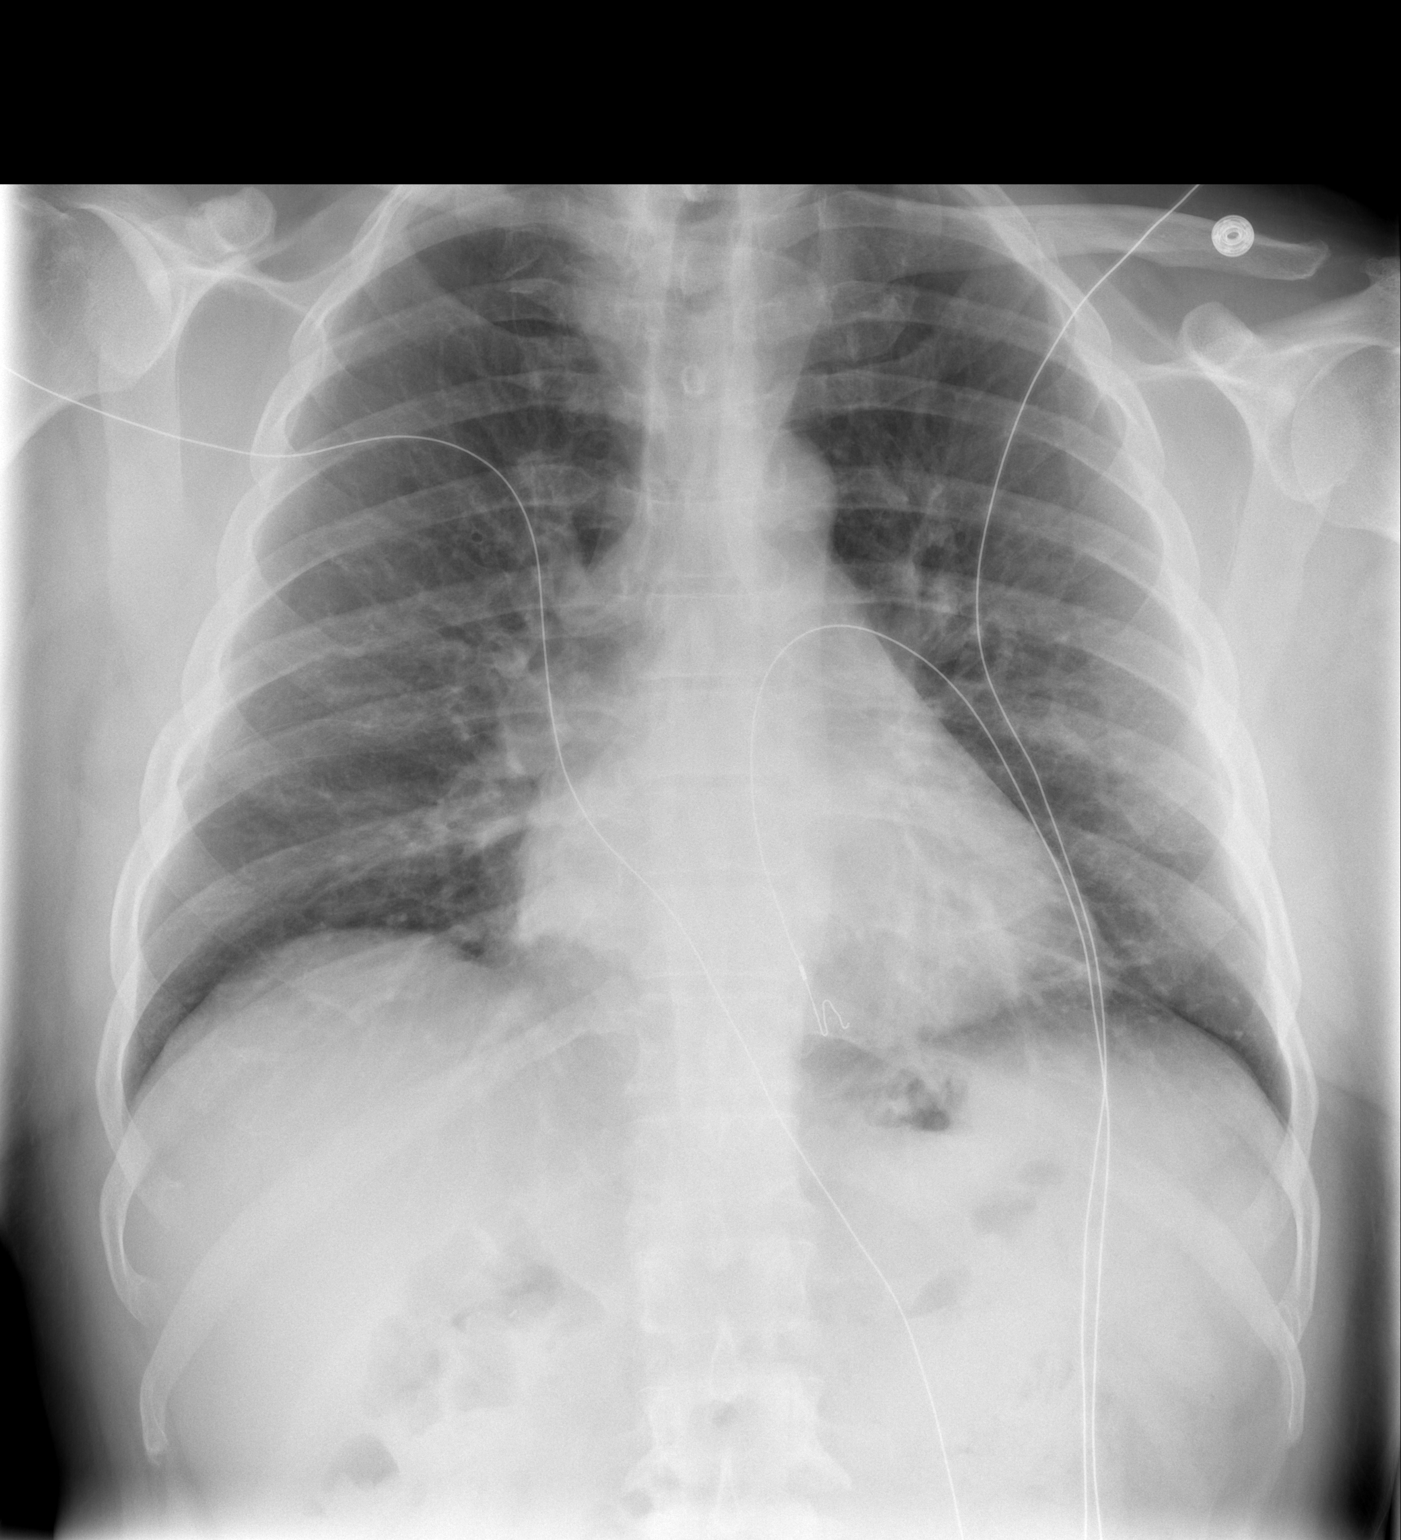

[w chest lat]
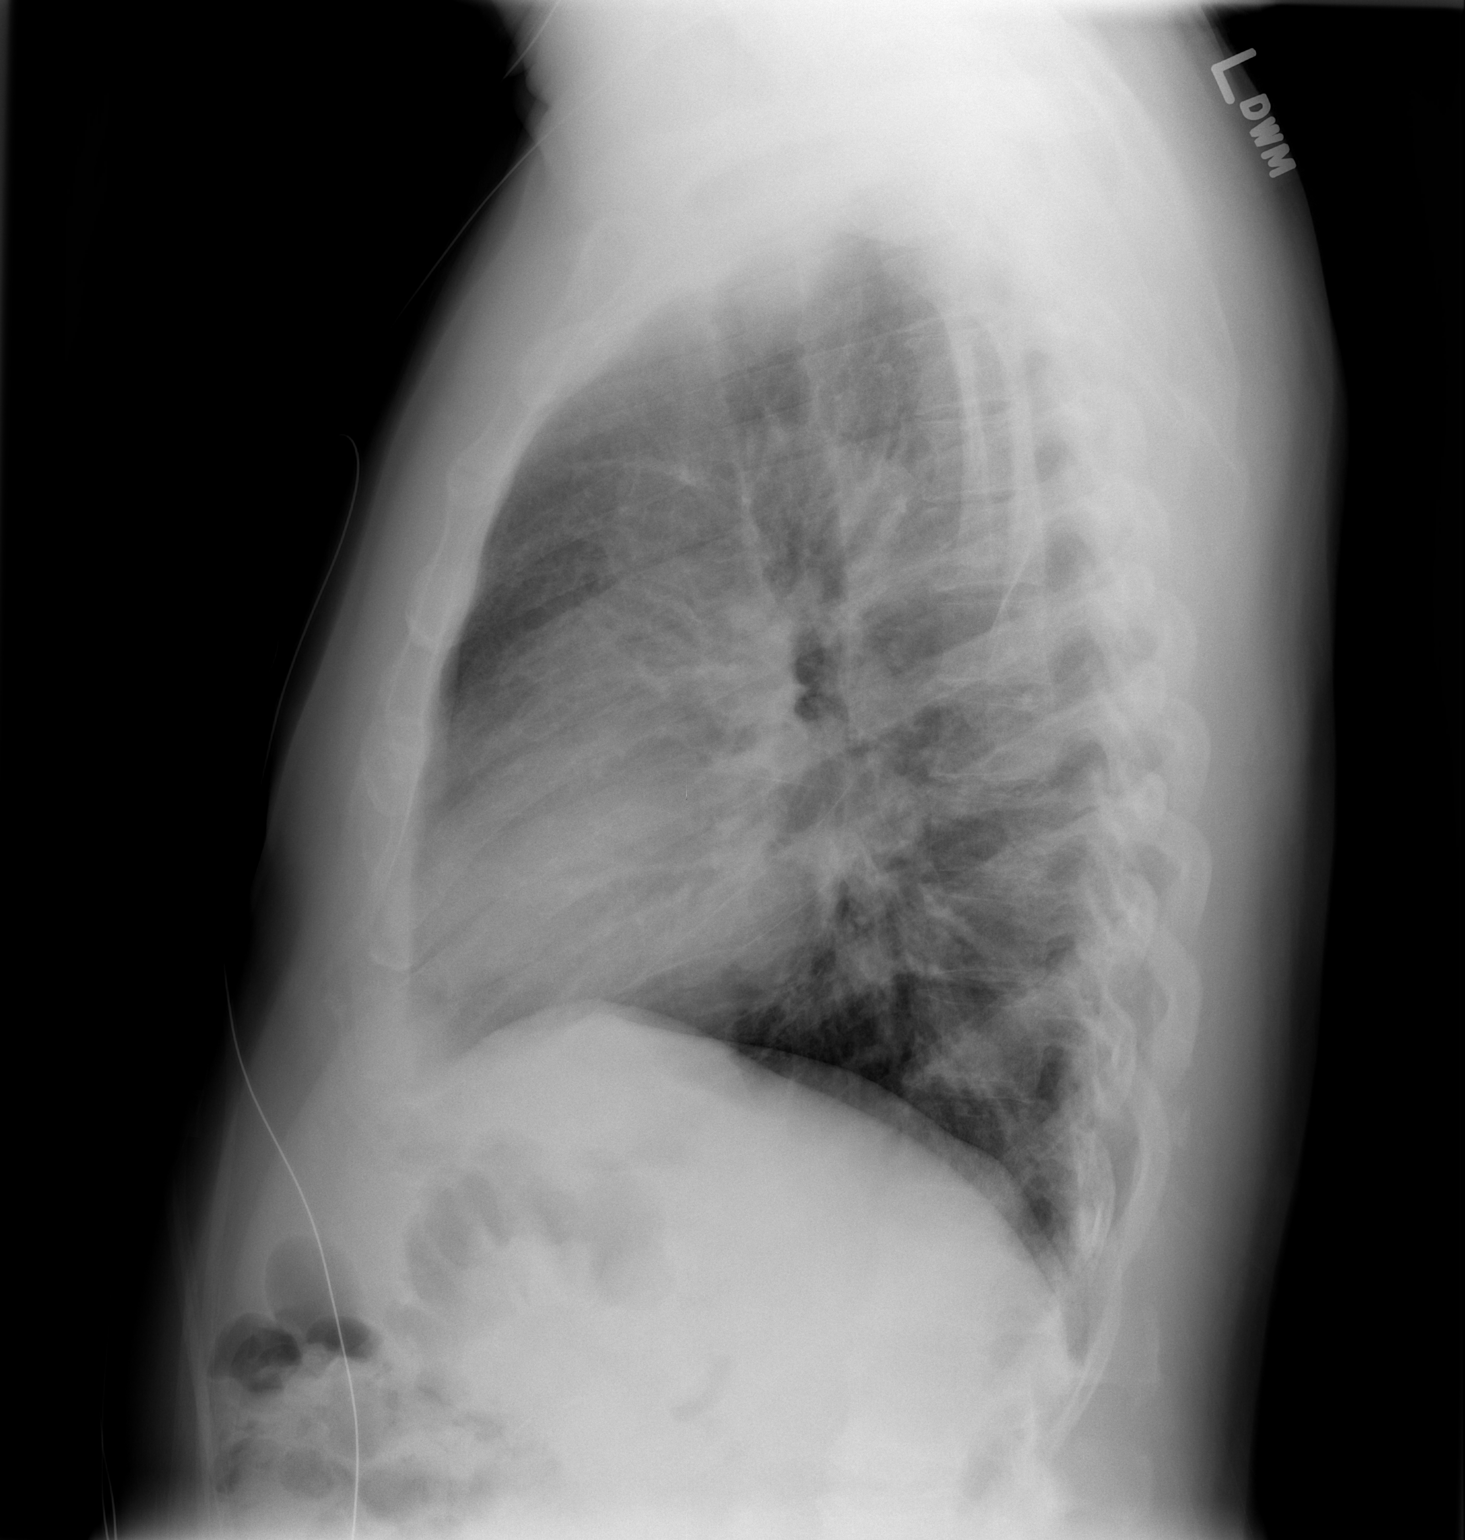

[2 of 2 positions shown; findings below may reference images not displayed]

FINDINGS: Borderline enlargement of cardiac silhouette.
Mediastinal contours normal.
Slight pulmonary vascular congestion.
Minimal bronchitic changes without infiltrate or effusion.
No pneumothorax.
Chronic resorption of the distal clavicles bilaterally.
No acute osseous findings.
IMPRESSION: Borderline enlargement of cardiac silhouette with minimal pulmonary
vascular congestion.
Minimal chronic bronchitic changes.

## 2012-08-25 IMAGING — CR DG CHEST 1V PORT
1 series · 1 of 1 positions shown · non-contrast
Comparison: Portable exam 1591 hours compared to 12/22/2010

CLINICAL DATA: Coronary artery disease post CABG, POD #2.

PORTABLE CHEST - 1 VIEW

[view not recorded]
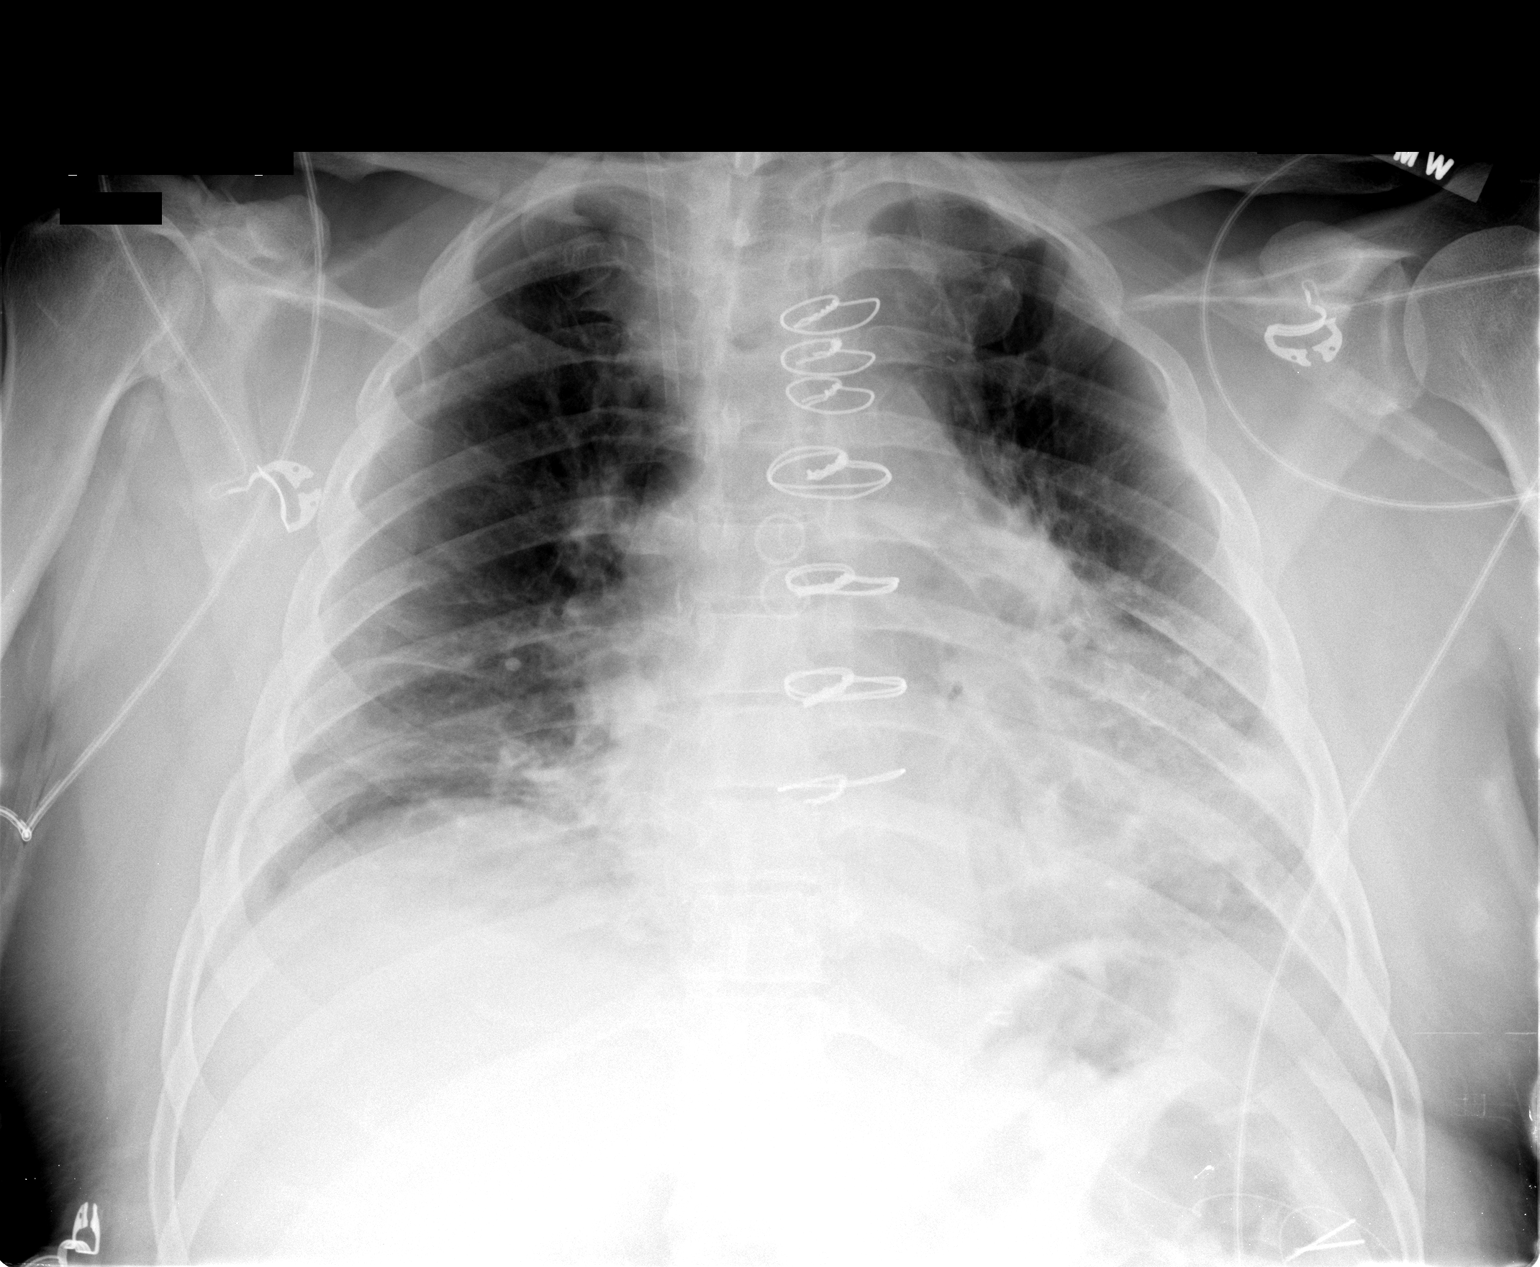

[1 of 1 positions shown; findings below may reference images not displayed]

FINDINGS: Interval removal of mediastinal drain, left thoracostomy tube and
Swan-Ganz catheter.
Right jugular line, tip SVC.
Enlargement of cardiac silhouette post CABG.
Stable mediastinal contours.
Increased left perihilar opacity with bibasilar atelectasis.
No pneumothorax following chest tube removal.
No gross pleural effusion identified.
Osseous structures unremarkable.
IMPRESSION: No pneumothorax following left thoracostomy tube removal.
Bibasilar atelectasis with slightly increased left perihilar
opacity, question atelectasis or infiltrate.

## 2012-08-26 IMAGING — CR DG CHEST 2V
2 series · 2 of 2 positions shown · non-contrast
Comparison: 12/23/2010

CLINICAL DATA: Postop CABG.  Short of breath

CHEST - 2 VIEW

[w chest pa]
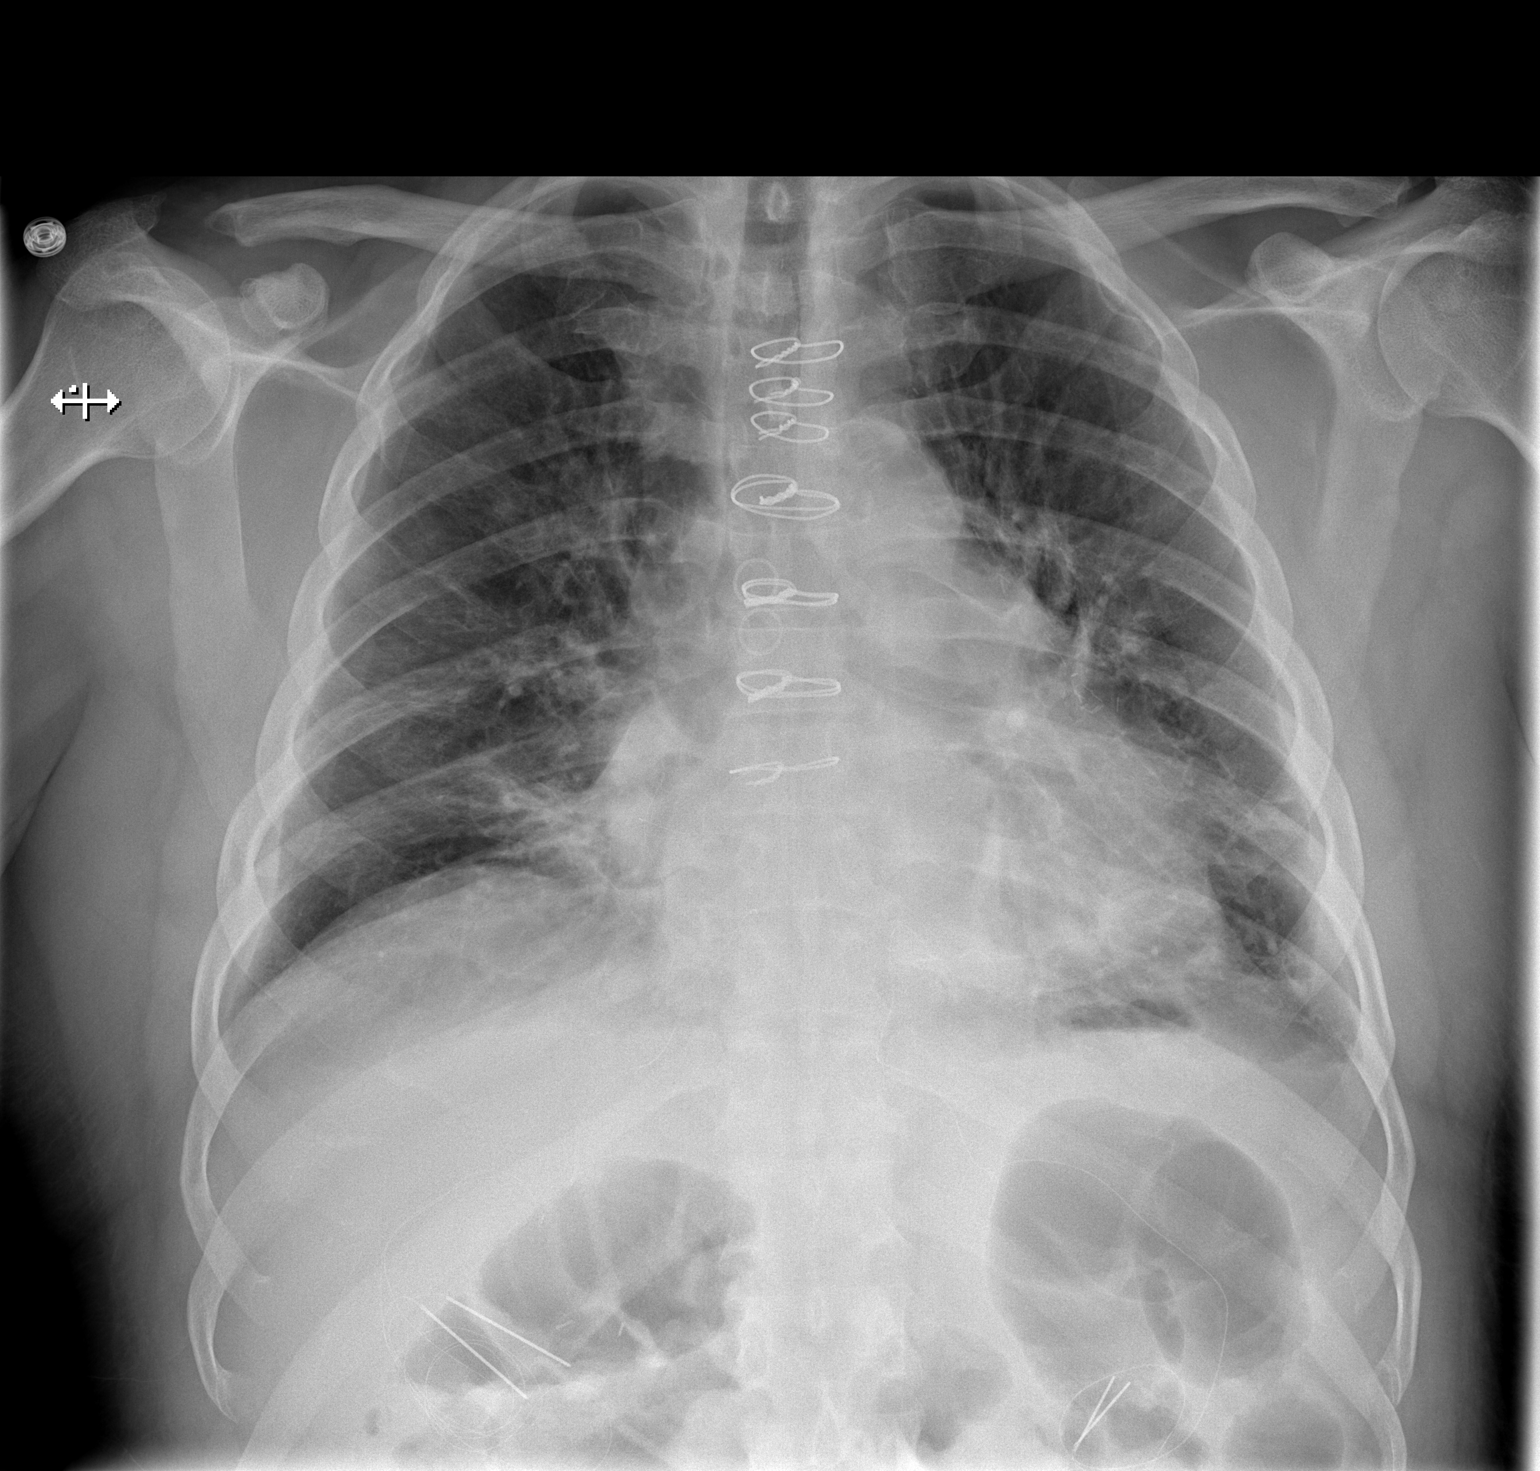

[w chest lat]
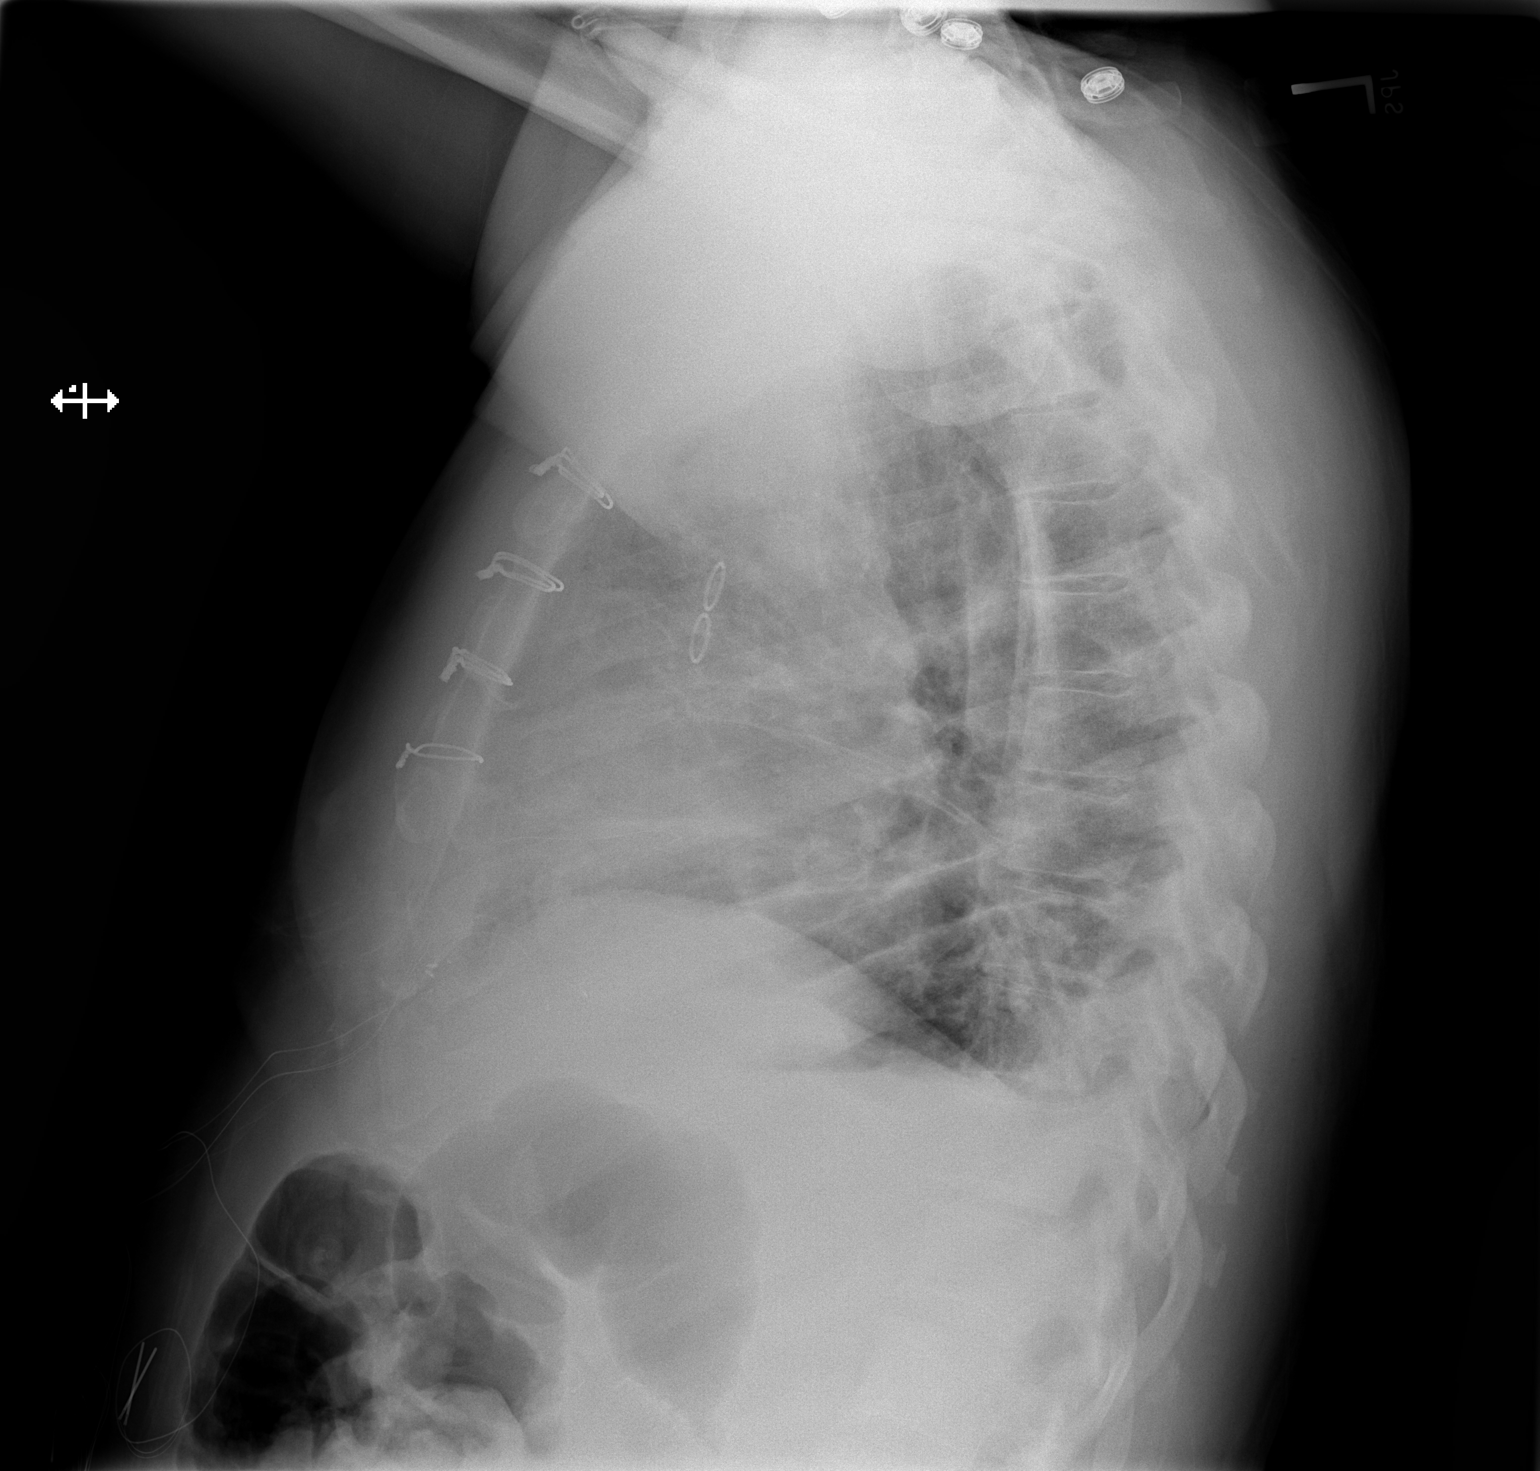

[2 of 2 positions shown; findings below may reference images not displayed]

FINDINGS: Central venous catheter has been removed.  No
pneumothorax.

Small bilateral effusions.  Mild bibasilar atelectasis has
improved.  Mild vascular congestion remains
IMPRESSION: Improved aeration in the bases.  Mild bibasilar atelectasis and
small effusions.

## 2012-08-28 ENCOUNTER — Ambulatory Visit: Payer: PRIVATE HEALTH INSURANCE | Admitting: Cardiology

## 2012-10-04 ENCOUNTER — Encounter: Payer: Self-pay | Admitting: Cardiology

## 2012-10-04 ENCOUNTER — Ambulatory Visit (INDEPENDENT_AMBULATORY_CARE_PROVIDER_SITE_OTHER): Payer: Medicare Other | Admitting: Cardiology

## 2012-10-04 VITALS — BP 116/76 | HR 67 | Ht 68.0 in | Wt 182.0 lb

## 2012-10-04 DIAGNOSIS — I2510 Atherosclerotic heart disease of native coronary artery without angina pectoris: Secondary | ICD-10-CM

## 2012-10-04 DIAGNOSIS — E785 Hyperlipidemia, unspecified: Secondary | ICD-10-CM

## 2012-10-04 DIAGNOSIS — I10 Essential (primary) hypertension: Secondary | ICD-10-CM

## 2012-10-04 DIAGNOSIS — R413 Other amnesia: Secondary | ICD-10-CM

## 2012-10-04 MED ORDER — ISOSORBIDE MONONITRATE ER 30 MG PO TB24: 30.0000 mg | ORAL_TABLET | Freq: Every day | ORAL | Status: DC

## 2012-10-04 NOTE — Assessment & Plan Note (Signed)
Also reports intermittent irritability. Etiology not entirely clear. Doubt cardiac etiology. Might consider referral to neurology from primary care. Otherwise keep follow up with Dr. Sherryll Burger.

## 2012-10-04 NOTE — Patient Instructions (Addendum)
Your physician recommends that you schedule a follow-up appointment in: 2 months. Your physician has recommended you make the following change in your medication: Increase your isosorbide mononitrate 30 mg to twice daily. Your new prescription has been sent to your pharmacy. All other medications will remain the same.

## 2012-10-04 NOTE — Assessment & Plan Note (Signed)
He continues on Lopid.

## 2012-10-04 NOTE — Assessment & Plan Note (Signed)
Multivessel and graft disease also status post PCI as noted above. He continues to report regular chest pain symptoms, although cardiac catheterization from September 2013 in similar setting revealed adequate revascularization with plan for medical management. I reviewed this with the patient and his wife today. We will try to use Imdur 30 mg split twice daily. We might be able to consider low dose Norvasc as an antianginal, although his blood pressure may limit this. Ranexa might be a consideration as well unless cost prohibitive. Office followup arranged.

## 2012-10-04 NOTE — Assessment & Plan Note (Signed)
Blood pressure well controlled

## 2012-10-04 NOTE — Progress Notes (Signed)
Clinical Summary [name] is a 62 y.o.male presenting for office followup. He is a former patient of Dr. Andee Lineman, most recently seen by Mr. Serpe in October 2013.  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 He was managed medically.  He did not followup after the last visit. Wife indicates that he did not tolerate 60 mg of Imdur at one time. He tells me that he has been having intermittent chest pain symptoms over the last 6 months, has not sought any other evaluation for this. He has not been hospitalized. Wife reports he uses nitroglycerin regularly.  He also complains of memory problems, also irritability and getting upset at times. He has not had this investigated by a neurologist. No obvious cardiac medications would seem to be a culprit.  ECG today shows sinus rhythm with right bundle branch block, no acute ST segment changes. We reviewed his cardiac medications and discussed options for medical management.   Allergies  Allergen Reactions  . Codeine Other (See Comments)    GI     Current Outpatient Prescriptions  Medication Sig Dispense Refill  . acetaminophen (TYLENOL) 500 MG tablet Take 1,000 mg by mouth every 6 (six) hours as needed. (for chest wall pain)      . aspirin 81 MG EC tablet Take 81 mg by mouth daily.        Marland Kitchen buPROPion (WELLBUTRIN XL) 300 MG 24 hr tablet Take 300 mg by mouth daily.      . clopidogrel (PLAVIX) 75 MG tablet Take 75 mg by mouth daily.        Marland Kitchen docusate sodium (COLACE) 100 MG capsule Take 200 mg  by mouth at bedtime.       Marland Kitchen gemfibrozil (LOPID) 600 MG tablet Take 600 mg by mouth 2 (two) times daily before a meal.        . isosorbide mononitrate (IMDUR) 30 MG 24 hr tablet Take 1 tablet (30 mg total) by mouth daily.  180 tablet  3  . metoprolol tartrate (LOPRESSOR) 25 MG tablet TAKE 1 TABLET TWICE DAILY.  60 tablet  1  . Multiple Vitamin (MULTIVITAMIN) tablet Take 1 tablet by mouth daily.        . nitroGLYCERIN (NITROSTAT) 0.4 MG SL tablet Place 0.4 mg under the tongue every 5 (five) minutes as needed. For chest pain       . pantoprazole (PROTONIX) 40 MG tablet Take 40 mg by mouth daily.        . sitaGLIPtan-metformin (JANUMET) 50-1000 MG per tablet Take 1 tablet by mouth 2 (two) times daily with a meal.      . traMADol (ULTRAM) 50 MG tablet Take 50 mg by mouth 4 (four) times daily.      . traZODone (DESYREL) 50 MG tablet Take 50 mg by mouth at bedtime.       No current facility-administered medications for this visit.    Past Medical History  Diagnosis Date  . Essential hypertension, benign   . Type 2 diabetes mellitus   . COPD (chronic obstructive pulmonary disease)   .  Dyslipidemia   . Coronary atherosclerosis of native coronary artery     Multivessel status post CABG, DES to SVG to PLA, DES to RCA  . Bell's palsy   . GERD (gastroesophageal reflux disease)   . Arthritis   . Cardiomyopathy     LVEF 45%    Past Surgical History  Procedure Laterality Date  . Shoulder surgery      Bilateral  . Appendectomy    . Cholecystectomy    . Tonsillectomy    . Coronary artery bypass graft  01/01/2011    LIMA to LAD, SVG to PLA, SVG to PDA  . Rotator cuff repair    . Knee arthroscopy      Social History [name] reports that he quit smoking about 21 months ago. His smoking use included Cigarettes. He has a 50 pack-year smoking history. He has never used smokeless tobacco. [name] reports that he does not drink alcohol.  Review of Systems No palpitations or syncope.  Reports erectile dysfunction. Stable appetite. No bleeding problems. He was negative . Physical Examination Filed Vitals:   10/04/12 1444  BP: 116/76  Pulse: 67   Filed Weights   10/04/12 1444  Weight: 182 lb (82.555 kg)   No acute distress. HEENT: Conjunctiva and lids normal, oropharynx clear. Neck: Supple, no elevated JVP or carotid bruits, no thyromegaly. Lungs: Clear to auscultation, nonlabored breathing at rest. Cardiac: Regular rate and rhythm, no S3 or significant systolic murmur. Abdomen: Soft, nontender,bowel sounds present. Extremities: No pitting edema, distal pulses 2+. Skin: Warm and dry. Musculoskeletal: No kyphosis. Neuropsychiatric: Alert and oriented x3, affect grossly appropriate. Calm.   Problem List and Plan   Coronary atherosclerosis of native coronary artery Multivessel and graft disease also status post PCI as noted above. He continues to report regular chest pain symptoms, although cardiac catheterization from September 2013 in similar setting revealed adequate revascularization with plan for medical management. I reviewed this with the patient and his wife today. We will try to use Imdur 30 mg split twice daily. We might be able to consider low dose Norvasc as an antianginal, although his blood pressure may limit this. Ranexa might be a consideration as well unless cost prohibitive. Office followup arranged.  Essential hypertension, benign Blood pressure well controlled.  Dyslipidemia He continues on Lopid.  Memory loss Also reports intermittent irritability. Etiology not entirely clear. Doubt cardiac etiology. Might consider referral to neurology from primary care. Otherwise keep follow up with Dr. Sherryll Burger.    Jonelle Sidle, M.D., F.A.C.C.

## 2012-10-08 ENCOUNTER — Other Ambulatory Visit: Payer: Self-pay | Admitting: Cardiology

## 2012-10-08 MED ORDER — ISOSORBIDE MONONITRATE ER 30 MG PO TB24: 30.0000 mg | ORAL_TABLET | Freq: Two times a day (BID) | ORAL | Status: DC

## 2012-12-05 ENCOUNTER — Ambulatory Visit: Payer: Medicare Other | Admitting: Cardiology

## 2012-12-05 ENCOUNTER — Encounter: Payer: Medicare Other | Admitting: Cardiology

## 2012-12-05 ENCOUNTER — Encounter: Payer: Self-pay | Admitting: Cardiology

## 2012-12-05 NOTE — Progress Notes (Signed)
No show  This encounter was created in error - please disregard.

## 2013-03-06 ENCOUNTER — Other Ambulatory Visit: Payer: Self-pay | Admitting: Otolaryngology

## 2013-03-06 DIAGNOSIS — H60399 Other infective otitis externa, unspecified ear: Secondary | ICD-10-CM

## 2013-03-08 ENCOUNTER — Other Ambulatory Visit: Payer: Self-pay | Admitting: Otolaryngology

## 2013-03-08 LAB — CREATININE, SERUM: Creat: 1.09 mg/dL (ref 0.50–1.35)

## 2013-03-08 LAB — BUN: BUN: 12 mg/dL (ref 6–23)

## 2013-03-12 ENCOUNTER — Ambulatory Visit
Admission: RE | Admit: 2013-03-12 | Discharge: 2013-03-12 | Disposition: A | Discharge status: Home / Self Care | Payer: Medicare Other | Source: Ambulatory Visit | Attending: Otolaryngology | Admitting: Otolaryngology

## 2013-03-12 DIAGNOSIS — H60399 Other infective otitis externa, unspecified ear: Secondary | ICD-10-CM

## 2013-03-12 MED ORDER — IOHEXOL 300 MG/ML  SOLN
75.0000 mL | Freq: Once | INTRAMUSCULAR | Status: AC | PRN
  Administered 2013-03-12: 75 mL via INTRAVENOUS

## 2013-05-01 ENCOUNTER — Ambulatory Visit (INDEPENDENT_AMBULATORY_CARE_PROVIDER_SITE_OTHER): Payer: Medicare Other | Admitting: Cardiology

## 2013-05-01 ENCOUNTER — Encounter: Payer: Self-pay | Admitting: Cardiology

## 2013-05-01 VITALS — BP 118/75 | HR 59 | Ht 68.0 in | Wt 179.0 lb

## 2013-05-01 DIAGNOSIS — I2510 Atherosclerotic heart disease of native coronary artery without angina pectoris: Secondary | ICD-10-CM

## 2013-05-01 DIAGNOSIS — E785 Hyperlipidemia, unspecified: Secondary | ICD-10-CM

## 2013-05-01 DIAGNOSIS — Z79899 Other long term (current) drug therapy: Secondary | ICD-10-CM

## 2013-05-01 DIAGNOSIS — I10 Essential (primary) hypertension: Secondary | ICD-10-CM

## 2013-05-01 DIAGNOSIS — Z01810 Encounter for preprocedural cardiovascular examination: Secondary | ICD-10-CM

## 2013-05-01 LAB — PROTIME-INR

## 2013-05-01 NOTE — Assessment & Plan Note (Addendum)
Patient being considered for elective ENT surgery under general anesthesia by Dr. [name] later this month. He has been clinically stable from a cardiac perspective with reasonable angina control on the current regimen. ECG is stable. Cardiac catheterization from September of last year is noted above with plan for medical therapy. I do not anticipate further cardiac testing at this time, overall has a moderate perioperative risk for cardiac complications. Dr. Jodi Marble office also requests additional studies to include PA and lateral chest x-ray, CBC, PT, PTT, and CMET These will be arranged and forwarded to his office. Mr. Selner should be able to hold Plavix and ASA one week prior to planned surgery to reduce bleeding risk. If the need arises, our cardiology service can see the patient in consultation when he is in the hospital perioperatively.

## 2013-05-01 NOTE — Assessment & Plan Note (Addendum)
Patient continues on Lopid.

## 2013-05-01 NOTE — Progress Notes (Signed)
Clinical Summary [name] is a 62 y.o.male last seen in April of this year. He is being considered for ENT surgery by Dr. [name] later this month. Preoperative evaluation has been requested. Patient is here with his wife today. Fortunately, he reports no progressive chest pain symptoms with typical ADLs and activity around 4 METS, seems to have tolerated the adjustment in Imdur that we made at the last visit.  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 He has been managed medically.  He continues on aspirin and Plavix, no major bleeding episodes.  ECG today shows sinus rhythm with old right bundle branch block, left axis as before.   Allergies  Allergen Reactions  . Codeine Other (See Comments)    GI     Current Outpatient Prescriptions  Medication Sig Dispense Refill  . acetaminophen (TYLENOL) 500 MG tablet Take 1,000 mg by mouth every 6 (six) hours as needed. (for chest wall pain)      . aspirin 81 MG EC tablet Take 81 mg by mouth daily.        Marland Kitchen buPROPion (WELLBUTRIN XL) 300 MG 24 hr tablet Take 300 mg by mouth daily.      . clopidogrel (PLAVIX) 75 MG tablet Take 75 mg by mouth daily.        Marland Kitchen docusate sodium (COLACE) 100 MG capsule Take 200 mg by mouth daily as needed.       . gabapentin (NEURONTIN) 100 MG capsule Take 100 mg by mouth at bedtime.      Marland Kitchen gemfibrozil (LOPID) 600 MG tablet Take 600 mg by mouth 2 (two) times daily before a meal.        . glimepiride (AMARYL) 4 MG tablet Take 4 mg by mouth daily with breakfast.       . isosorbide mononitrate (IMDUR) 30 MG 24 hr tablet Take 1 tablet (30 mg total) by mouth 2 (two) times daily.  180 tablet  2  . metFORMIN (GLUCOPHAGE) 1000 MG tablet Take 1,000 mg by mouth 2 (two) times daily with a meal.      . metoprolol tartrate (LOPRESSOR) 25 MG tablet TAKE 1 TABLET TWICE DAILY.  60 tablet  1  . Multiple Vitamin (MULTIVITAMIN) tablet Take 1 tablet by mouth daily.        . nitroGLYCERIN (NITROSTAT) 0.4 MG SL tablet Place 0.4 mg under the tongue every 5 (five) minutes as needed. For chest pain       . pantoprazole (PROTONIX) 40 MG tablet Take 40 mg by mouth daily.        . traMADol (ULTRAM) 50 MG tablet Take 50 mg by mouth 4 (four) times daily.      . traZODone (DESYREL) 50 MG tablet Take 50 mg by mouth at bedtime.       No current facility-administered medications for this visit.    Past Medical History  Diagnosis Date  . Essential hypertension, benign   . Type 2 diabetes mellitus   . COPD (chronic obstructive pulmonary disease)   . Dyslipidemia   . Coronary atherosclerosis of  native coronary artery     Multivessel status post CABG, DES to SVG to PLA, DES to RCA  . Bell's palsy   . GERD (gastroesophageal reflux disease)   . Arthritis   . Cardiomyopathy     LVEF 45%    Past Surgical History  Procedure Laterality Date  . Shoulder surgery      Bilateral  . Appendectomy    . Cholecystectomy    . Tonsillectomy    . Coronary artery bypass graft  01/01/2011    LIMA to LAD, SVG to PLA, SVG to PDA  . Rotator cuff repair    . Knee arthroscopy      Family History  Problem Relation Age of Onset  . Diabetes    . Hypertension      Social History [name] reports that he quit smoking about 2 years ago. His smoking use included Cigarettes. He has a 50 pack-year smoking history. He has never used smokeless tobacco. [name] reports that he does not drink alcohol.  Review of Systems Diminished hearing on the right side, no palpitations or syncope. No  orthopnea or PND. No claudication. Stable appetite. Otherwise negative.  Physical Examination Filed Vitals:   05/01/13 1056  BP: 118/75  Pulse: 59   Filed Weights   05/01/13 1056  Weight: 179 lb (81.194 kg)    No acute distress.  HEENT: Conjunctiva and lids normal, oropharynx clear.  Neck: Supple, no elevated JVP or carotid bruits, no thyromegaly.  Lungs: Clear to auscultation, nonlabored breathing at rest.  Cardiac: Regular rate and rhythm, no S3 or significant systolic murmur.  Abdomen: Soft, nontender,bowel sounds present.  Extremities: No pitting edema, distal pulses 2+.  Skin: Warm and dry.  Musculoskeletal: No kyphosis.  Neuropsychiatric: Alert and oriented x3, affect grossly appropriate. Calm.   Problem List and Plan   Preoperative cardiovascular examination Patient being considered for elective ENT surgery under general anesthesia by Dr. [name] later this month. He has been clinically stable from a cardiac perspective with reasonable angina control on the current regimen. ECG is stable. Cardiac catheterization from September of last year is noted above with plan for medical therapy. I do not anticipate further cardiac testing at this time, overall has a moderate perioperative risk for cardiac complications. Dr. Jodi Marble office also requests additional studies to include PA and lateral chest x-ray, CBC, PT, PTT, and CMET These will be arranged and forwarded to his office. [name] should be able to hold Plavix and ASA one week prior to planned surgery to reduce bleeding risk. If the need arises, our cardiology service can see the patient in consultation when he is in the hospital perioperatively.  Essential hypertension, benign Blood pressure is normal today. No changes made.  Dyslipidemia Patient continues on Lopid.  Coronary atherosclerosis of native coronary artery Multivessel disease as detailed above status post CABG and PCI over time, cardiac catheterization in  September 2013 reviewed. Continue medical therapy.    Jonelle Sidle, M.D., F.A.C.C.

## 2013-05-01 NOTE — Patient Instructions (Addendum)
Your physician recommends that you schedule a follow-up appointment in: 6 months. You will receive a reminder letter in the mail in about 4 months reminding you to call and schedule your appointment. If you don't receive this letter, please contact our office. Your physician recommends that you continue on your current medications as directed. Please refer to the Current Medication list given to you today. Your physician recommends that you have lab work and a chest x-ray today. Labs include CBC/diff, PT, PTT,CMET.

## 2013-05-01 NOTE — Assessment & Plan Note (Signed)
Blood pressure is normal today. No changes made. 

## 2013-05-01 NOTE — Assessment & Plan Note (Signed)
Multivessel disease as detailed above status post CABG and PCI over time, cardiac catheterization in September 2013 reviewed. Continue medical therapy.

## 2013-05-02 ENCOUNTER — Telehealth: Payer: Self-pay | Admitting: Cardiology

## 2013-05-02 NOTE — Telephone Encounter (Signed)
Pt gave verbal ok to give results to wife. Informed wife of results.

## 2013-05-02 NOTE — Telephone Encounter (Signed)
Message copied by Burnice Logan on Thu May 02, 2013  4:37 PM ------      Message from: MCDOWELL, Illene Bolus      Created: Thu May 02, 2013 12:39 PM       Reviewed lab work. Please forward copy to Dr. [name] ------

## 2013-05-06 ENCOUNTER — Other Ambulatory Visit: Payer: Self-pay | Admitting: Otolaryngology

## 2013-05-06 ENCOUNTER — Encounter (HOSPITAL_COMMUNITY)
Admission: RE | Admit: 2013-05-06 | Discharge: 2013-05-06 | Disposition: A | Discharge status: Home / Self Care | Payer: Medicare Other | Source: Ambulatory Visit | Attending: Otolaryngology | Admitting: Otolaryngology

## 2013-05-06 ENCOUNTER — Encounter (HOSPITAL_COMMUNITY): Payer: Self-pay

## 2013-05-06 DIAGNOSIS — Z01812 Encounter for preprocedural laboratory examination: Secondary | ICD-10-CM | POA: Insufficient documentation

## 2013-05-06 HISTORY — DX: Anxiety disorder, unspecified: F41.9

## 2013-05-06 HISTORY — DX: Depression, unspecified: F32.A

## 2013-05-06 HISTORY — DX: Major depressive disorder, single episode, unspecified: F32.9

## 2013-05-06 LAB — COMPREHENSIVE METABOLIC PANEL
ALT: 27 U/L (ref 0–53)
AST: 28 U/L (ref 0–37)
Alkaline Phosphatase: 99 U/L (ref 39–117)
BUN: 16 mg/dL (ref 6–23)
CO2: 27 mEq/L (ref 19–32)
Calcium: 9.6 mg/dL (ref 8.4–10.5)
Chloride: 95 mEq/L — ABNORMAL LOW (ref 96–112)
GFR calc Af Amer: 88 mL/min — ABNORMAL LOW (ref >90)
GFR calc non Af Amer: 76 mL/min — ABNORMAL LOW (ref >90)
Glucose, Bld: 214 mg/dL — ABNORMAL HIGH (ref 70–99)
Potassium: 4.3 mEq/L (ref 3.5–5.1)
Total Bilirubin: 0.4 mg/dL (ref 0.3–1.2)
Total Protein: 8.2 g/dL (ref 6.0–8.3)

## 2013-05-06 LAB — CBC WITH DIFFERENTIAL/PLATELET
Basophils Absolute: 0 10*3/uL (ref 0.0–0.1)
Eosinophils Absolute: 0.1 10*3/uL (ref 0.0–0.7)
HCT: 38.7 % — ABNORMAL LOW (ref 39.0–52.0)
Hemoglobin: 13.5 g/dL (ref 13.0–17.0)
Lymphocytes Relative: 31 % (ref 12–46)
Lymphs Abs: 3 10*3/uL (ref 0.7–4.0)
MCH: 31.2 pg (ref 26.0–34.0)
Monocytes Relative: 6 % (ref 3–12)
Neutrophils Relative %: 62 % (ref 43–77)
Platelets: 309 10*3/uL (ref 150–400)
RBC: 4.33 MIL/uL (ref 4.22–5.81)
RDW: 13.2 % (ref 11.5–15.5)
WBC: 9.7 10*3/uL (ref 4.0–10.5)

## 2013-05-06 LAB — PROTIME-INR: Prothrombin Time: 12.9 seconds (ref 11.6–15.2)

## 2013-05-06 NOTE — Pre-Procedure Instructions (Addendum)
[name]  05/06/2013   Your procedure is scheduled on:  05/20/13  Report to [facility] short stay admitting At 700 AM.  Call this number if you have problems the morning of surgery: [phone]   Remember:   Do not eat food or drink liquids after midnight.   Take these medicines the morning of surgery with A SIP OF WATER: metoprolol, isosorbide, pantoprazole, pain med if need          STOP aspirin, plavix per dr,            stop  Multi vit on 05/14/13   Do not wear jewelry, make-up or nail polish.  Do not wear lotions, powders, or perfumes. You may wear deodorant.  Do not shave 48 hours prior to surgery. Men may shave face and neck.  Do not bring valuables to the hospital.  [facility] is not responsible                  for any belongings or valuables.               Contacts, dentures or bridgework may not be worn into surgery.  Leave suitcase in the car. After surgery it may be brought to your room.  For patients admitted to the hospital, discharge time is determined by your                treatment team.               Patients discharged the day of surgery will not be allowed to drive  home.  Name and phone number of your driver:   Special Instructions: Shower using CHG 2 nights before surgery and the night before surgery.  If you shower the day of surgery use CHG.  Use special wash - you have one bottle of CHG for all showers.  You should use approximately 1/3 of the bottle for each shower.   Please read over the following fact sheets that you were given: Pain Booklet, Coughing and Deep Breathing and Surgical Site Infection Prevention

## 2013-05-06 NOTE — Progress Notes (Signed)
To see cardiology today re: d/c coumadin

## 2013-05-17 NOTE — H&P (Signed)
[name] is an 62 y.o. male.   Chief Complaint: Chronic R ear pain, hearing loss, constant tinnitus and repeat falling.  HPI: See H&P below  Clinical Summary Letter   Patient: [name]  Provider: Ermalinda Barrios, MD, MS, FACS  Date of Service:  May 06, 2013  Location: [facility] [place], [place].                  [address]                  [place], [place]   [id number]                                Ph: [phone], Fax: [phone]                  [email or web address]     Provider: Ermalinda Barrios, MD, MS, FACS Encounter Date: May 06, 2013  Patient: [name], [name]    ([id number]) Sex: Male       DOB: September 11, 1950      Age: 10 year 1 month       Race: White Address: [address],  [place]  [place]  [address] [address].: [facility], MD Insurance: [employer]   Visit Type: [name], a 39 year 1 month White male is here today for a pre-operative visit.  Complaint/HPI: The patient was here today with his wife, for preoperative evaluation prior to undergoing a right canal up tympanomastoidectomy and type I tympanoplasty AD. The patient has received preoperative anesthesia clearance from his cardiologist, and laboratory diagnostic studies today were basically within normal limits. He had some low values on several tests, but they were very close to normal. None were remarkable. He thinks that his ears are infected now. His procedure is scheduled for May 20, 2013 at [facility].   Previous history: The patient was here today with his wife for follow-up after being treated for +MSSA chronic suppurative otitis media and mastoiditis AD. The patient is feeling better. Patient has had six stents placed in his coronary arteries and is a Type II diabetic on oral agents. He is being cared for by the [facility] cardiology group in [place], [place]. A CT scan of his temporal bones has documented chronic mastoiditis and suppurative otitis media  AD.  Previous history: The patient was here today with his wife continued to complain of intermittent pain in his right ear and some bloody drainage. The patient does not complain of vertigo, tinnitus, or fluctuation in his hearing. He was treated for a cMRSA infection. 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 He still feels that his hearing has diminished.  Previous history:The patient was here today with his wife for follow-up after being treated for chronic otitis media and suspected chronic mastoiditis, right ear. He had been placed on Cipro and Ciprodex drops. His culture grew +MSSA resistant and immediately resistant to the Flouroquinolones. His pain has resolved and he is feeling somewhat better. The patient is currently on in a coagulation and has known heart disease.  Previous history: The patient was here today with his wife, for evaluation of chronic right ear pain, hearing loss, and repeat falling. The patient is a Type II diabetic. He states it. Both of his ears hurt. He has a constant high-pitched nonpulsatile tinnitus  in both ears. He has had childhood ear infections and tubes in the past. He has had occasional bleeding from his right ear and yellow drainage. His hearing has decreased. 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 He also has a history of fire arm use. His father had hearing loss and worked in a Equities trader. The patient has had a CABG and stents in his vein grafts as well as a redo CABG. The patient does not have a pacemaker in place.   Current Medication: 1. Doxycycline Mono 100 Mg Cap  SIG: Take 1 twice daily x 10 days 2. Mupirocin 2% Ointment  SIG: Apply to affected area daily 3. Acetaminophen 500 Mg Caplet (Other MD)  4. Aspirin 81 Mg Chewable Tablet (Other MD)  5. Crestor 10 Mg Tablet (Other MD)  6. Cyclobenzaprine 5 Mg Tablet (Other MD)   7. Docusate Sodium 100 Mg Softgel (Other MD)  8. Fish Oil 1,200 Mg Softgel 360-1,200 (Other MD)  9. Imdur Er 30 Mg Tablet (Other MD)  10. Iron Tablet 325 Mg (65 Mg Iron) (Other MD)  11. Janumet 50-500 Mg Tablet (Other MD)  12. Lopid 600 Mg Tablet (Other MD)  13. Lopressor 50 Mg Tablet (Other MD)  14. Multi Vitamin Daily Tablet (Other MD)  15. Nitrostat 0.4 Mg Tablet Sl (Other MD)  16. Protonix Dr 40 Mg Tablet (Other MD)  17. Tramadol Hcl 50 Mg Tablet (Other MD)  18. Trazodone 50 Mg Tablet (Other MD)   Medical History: Surgical History: Prior surgeries include appendectomy, gallbladder surgery and heart, shoulder, and foot surgery.  Family History: The patient has a family history of Diabetes mellitus, Hearing loss and Heart disease.  Social History: No. Smoking: His current smoking status is never smoker/non-smoker. Alcohol: Patient denies drinking alcoholic beverages. Marital Status: Patient is married. HIV status: (-) HIV status. Noise exposure: Noise exposure:. Recreational Drug Use: He denies recreational drug use.  Allergy: Codeine  ROS: General: (-) fever, (-) chills, (-) night sweats, (-) fatigue, (-) weakness, (-) changes in appetite or weight. (-) allergies, (-) not immunocompromised. Head: (-) headaches, (-) head injury or deformity. Eyes: (-) visual changes, (-) eye pain, (-) eye discharges, (-) redness, (-) itching, (-) excessive tearing, (-) double or blurred vision, (-) glaucoma, (-) cataracts. Ears: (+) earache , (+) hearing loss , (+) tinnitus. Nose and Sinuses: (-) frequent colds, (-) nasal stuffiness or itchiness, (-) postnasal drip, (-) hay fever, (-) nosebleeds, (-) sinus trouble. Mouth and Throat: (-) bleeding gums, (-) toothache, (-) odd taste sensations, (-) sores on tongue, (-) frequent sore throat, (-) hoarseness. Neck: (-) swollen glands, (-) enlarged thyroid, (-) neck pain. Cardiac: (+) chest pain , (+) heart attack (myocardial  infarction). Respiratory: (-) cough, (-) hemoptysis, (-) shortness of breath, (-) cyanosis, (-) wheezing, (-) nocturnal choking or gasping, (-) TB exposure. Gastrointestinal: (+) reflux. Urinary: (-) dysuria, (-) frequency, (-) urgency, (-) hesitancy, (-) polyuria, (-) nocturia, (-) hematuria, (-) urinary incontinence, (-) flank pain, (-) change in urinary habits. Gynecologic/Urologic: (-) genital sores or lesions, (-) history of STD, (-) sexual difficulties. Musculoskeletal: (+) arthritis. Peripheral Vascular: (-) intermittent claudication, (-) cramps, (-) varicose veins, (-) thrombophlebitis. Neurological:  (+) headache , bell's palsy. Psychiatric: (-) anxiety, (-) depression, (-) sleep disturbance, (-) irritability, (-) mood swings, (-) suicidal thoughts or ideations. Endocrine: diabetes mellitus. Hematologic/Lymphatic: (-) anemia, (-) easy bruising, (-) excessive bleeding, (-) history of blood transfusions. Skin: (-)  rashes, (-) lumps, (-) itching, (-) dryness, (-) acne, (-) discoloration, (-) recurrent skin infections, (-) changes in hair, nails or moles.  Vital Signs: Weight:   83.461 kgs Height:   5\' 7"  BMI:   28.82 BSA:   1.98 BP:   136/76  Examination: General Appearance - Adult: The patient is a well-developed, well-nourished, male, has no recognizable syndromes or patterns of malformation, and is in no acute distress. He is awake, alert, coherent, spontaneous, and logical. He is oriented to time, place, and person and communicates without difficulty.  Head: The patient's head was normocephalic and without any evidence of trauma or lesions.  Face: His facial motion was intact and symmetric bilaterally with normal resting facial tone and voluntary facial power.  Skin: Gross inspection of his facial skin demonstrated no evidence of abnormality.  Eyes: His pupils are equal, regular, reactive to light and accomodate (PERRLA). Extraocular movements were intact (EOMI). Conjunctivae  were normal. There was no sclera icterus. There was no nystagmus. Eyelids appeared normal. There was no ptosis, lidlag, lid edema, or lagophthalmus.  External ears: Both of his external ears were normal in size, shape, angulation, and location.  External auditory canals: His external auditory canal was normal in diameter and had intact, healthy skin. There were no signs of infection, exposed bone, or canal cholesteatoma. Minimal cerumen was removed to facilitate examination.  Right Tympanic Membrane: There was a 40% perforation The perforation was located inferiorly The ear is wet again today and is draining. Middle ear mucosa is infected.  Left Tympanic Membrane: The left tympanic membrane was clear and mobile.  Nose - external exam: External examination of the nose revealed a stable nasal dorsum with normal support, normal skin, and patent nares. There were no deformities. Nose - internal exam: Anterior rhinoscopy revealed healthy, pink nasal septal and inferior/middle turbinate mucosa. The nasal septum was midline and without lesions or perforations. There was no bleeding noted. There were no polyps, lesions, masses or foreign bodies. His airway was patent bilaterally.  Oral Cavity: Examination of the oral cavity revealed healthy moist mucosa, no evidence of lesions, ulcerations, erythema, edema, or leukoplakia. Gingiva and teeth were unremarkable. His lips, tongue and palates were normal. There were no lingual fasciculations. The oropharynx was symmetric and without lesions. The gag reflex was intact and symmetric.  Neck: Examination of his neck revealed full range of motion without pain. There were no significant palpable masses or cervical lymphadenopathy. There was normal laryngeal crepitus. The trachea was midline. His thyroid gland was not enlarged and did not have any palpable masses. There was no evidence of jugular venous distention. There were no audible carotid bruits.  Triage  Note: Mrs. Kessner called to say that they could not afford the Tobradex drops so Dr. [name] said to call in Cortisporin drops. Triage note entered by Polly Cobia.  Audiology Procedures: Audiogram/Graph Audiogram: I have referred the patient for audiometric testing. I have reviewed the patient's audiogram. (EMK). The patient was found to have an SRT, 45 dB right ear with moderate mixed hearing loss and 80% discrimination. Hit an SRT of 30 dB left ear with 88% discrimination.  Impression: Other:  1. 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 Chronic mastoiditis and middle ear disease has been documented on a CT scan of his temporal bones. The patient would now benefit from a left canal up tympanomastoidectomy and type I tympanoplasty. The patient is at some risk for surgical intervention due to  his heart disease and diabetes mellitus. 2. I recommended that we now proceed with a right canal up tympanomastoidectomy with type I tympanoplasty, four hours, [facility], with overnight observation in a monitored bed in the stepdown unit. He has had a cardiology consultation and preanesthesia clearance for a right canal up tympanomastoidectomy with tympanoplasty and insertion of a tube. He will stop his Plavix on May 13, 2013 not take any aspirin or aspirin containing products. Risks, complications, and alternatives were explained to the patient and to his wife. Questions were invited and answered. Informed consent was signed and witnessed.  3. Moderate low frequency conductive hearing loss in the right ear along with a moderate high-frequency sensorineural hearing loss in the right ear and a moderate high-frequency sensorineural hearing loss in the left ear.  Plan: Clinical summary letter made available to patient today. This letter may not be complete at time of service. Please contact our office within 3 days for a completed summary of  today's visit.  Status: Infected ear(s): right ear. Medications: The patient was placed on Tobradex but cannot afford the medication. He was switched to Cortisporin drops AD until his procedure can be performed. Diet: diabetic ( calories/day). Procedure: Right canal up tympanomastoidectomy with type I tympanoplasty, UMT AD, [facility] 05-20-13.  Informed consent: Informed consent was provided in a quiet examination room and was witnessed. Risks, complications, and alternatives of ear surgery (such as doing nothing or using a hearing aid) were explained to the and included, but were not limited to: infection, bleeding, reaction to anesthesia, delayed perforation of the tympanic membrane and need for future repair, facial paresis or paralysis, tinnitus, vertigo and/or disequilibrium, partial or total loss of hearing, transient or prolonged taste disturbance, need for additional procedures, failure and/extrusion of prostheses, complications related to the use of cements, hypertrophic scar formation, ear numbness, failure to preserve or improve hearing, other unforeseen or unpredictable complications, death, etc. Questions were invited and answered. Preoperative teaching and counseling were provided. Informed consent - status: Informed consent was provided and was signed and witnessed. Patient Instructions: Water precautions were explained to the patient/parent/legal guardian. The patient is to protect their ears using cotton with vaseline plugs, wax/silicon ear plugs, fitted ear plugs, or custom ear plugs. and Stop Plavix and all aspirin and aspirin containing products as well as NSAIDs on 11/17/ 2014. Follow-Up: Postoperative visit as scheduled.  Diagnosis: 382.3  Chronic Suppurative Otitis Media - NOS  383.1  Chronic Mastoiditis  380.16  Chronic Infective Otitis Externa - NOS  250.00  Diabetes Mellitus without Mention of Complication-Type II, not Stated as Uncontrolled  389.15   Sensorineural Hearing Loss - Unilateral  389.02  Conductive Hearing Loss - Tympanic Membrane  389.03  Conductive Hearing Loss - Middle Ear    Prescription: 1. Cortisporin Ear Solution 3.5-10,000-1 Mg-unit/ml-%  SIG: 3 drops in both ears 3 times/day x 1 wk  QTY: 1.00 2. Tobradex Eye Drops 0.3-0.1 %  SIG: 3 drops in both ears 3 times/day x 1 wk  QTY: 5.00 REF: 2  Careplan: (1) Hearing Loss (2) Otitis Media (3) Postop Mastoidectomy/Tympanomastoidectomy (4) Tympanic Membrane Perforation  Followup: Postop visit        Next Appointment: 05/20/2013 at 09:05 AM      Past Medical History  Diagnosis Date  . Essential hypertension, benign   . Type 2 diabetes mellitus   . COPD (chronic obstructive pulmonary disease)   . Dyslipidemia   . Coronary atherosclerosis of native coronary  artery     Multivessel status post CABG, DES to SVG to PLA, DES to RCA  . Bell's palsy   . GERD (gastroesophageal reflux disease)   . Arthritis   . Cardiomyopathy     LVEF 45%  . Myocardial infarction   . Anginal pain     last nitro taken few weeks  . Anxiety   . Depression   . Pneumonia     hx  . Neuromuscular disorder 90    bell's palsy  . Headache(784.0)   . Anemia     hx    Past Surgical History  Procedure Laterality Date  . Shoulder surgery      Bilateral  . Appendectomy    . Cholecystectomy    . Coronary artery bypass graft  01/01/2011    LIMA to LAD, SVG to PLA, SVG to PDA  . Rotator cuff repair Bilateral   . Knee arthroscopy Left   . Cardiac catheterization      stents 9/14  . Hammer toe surgery Bilateral   . Tonsillectomy      tubes    Family History  Problem Relation Age of Onset  . Diabetes    . Hypertension     Social History:  reports that he quit smoking about 2 years ago. His smoking use included Cigarettes. He has a 50 pack-year smoking history. He has never used smokeless tobacco. He reports that he does not drink alcohol or use illicit drugs.  Allergies:   Allergies  Allergen Reactions  . Bee Venom Anaphylaxis  . Codeine Shortness Of Breath and Other (See Comments)    No prescriptions prior to admission    No results found for this or any previous visit (from the past 48 hour(s)). No results found.  Review of Systems  Constitutional: Negative.   HENT: Positive for ear discharge, ear pain and hearing loss.   Eyes: Negative.   Respiratory: Negative.   Cardiovascular: Negative.   Gastrointestinal: Negative.   Genitourinary: Negative.   Musculoskeletal: Negative.   Skin: Negative.   Neurological: Negative.   Endo/Heme/Allergies: Negative.     There were no vitals taken for this visit. Physical Exam   Assessment/Plan 1. Chronic R mastoiditis 2. Chronic suppurative otitis media, R ear 3. Chronic infective otitis external, R ear 4. Sensorineural hearing loss, unilateral 5. Conductive hearing loss, tympanic membrane 6. Conductive hearing loss, middle ear 7. Diabetes mellitus, type II 8. History of coronary artery disease & myocardial infarction. Preoperative cardiac clearance obtained. 9. Scheduled for R canal up tympanomastoidectomy with type I tympanoplasty, 4 hours, [facility], 05-20-13, general anesthesia, 23 hr overnight observation in monitored bed (step down unit). Risks, complications and alternatives explained to the patient and to his wife. Questions invited and answered. Informed consent has been signed and witnesses. Preoperative teaching and counseling provided. Pre-anesthesia clearance obtained from cardiology.  [name], Drue Camera M 05/17/2013, 3:35 PM

## 2013-05-20 ENCOUNTER — Observation Stay (HOSPITAL_COMMUNITY)
Admission: RE | Admit: 2013-05-20 | Discharge: 2013-05-21 | Disposition: A | Discharge status: Home / Self Care | Payer: Medicare Other | Source: Ambulatory Visit | Attending: Otolaryngology | Admitting: Otolaryngology

## 2013-05-20 ENCOUNTER — Encounter (HOSPITAL_COMMUNITY): Payer: Self-pay | Admitting: *Deleted

## 2013-05-20 ENCOUNTER — Encounter (HOSPITAL_COMMUNITY)
Admission: RE | Disposition: A | Discharge status: Home / Self Care | Payer: Self-pay | Source: Ambulatory Visit | Attending: Otolaryngology

## 2013-05-20 ENCOUNTER — Encounter (HOSPITAL_COMMUNITY): Payer: Medicare Other | Admitting: Anesthesiology

## 2013-05-20 ENCOUNTER — Ambulatory Visit (HOSPITAL_COMMUNITY): Payer: Medicare Other | Admitting: Anesthesiology

## 2013-05-20 DIAGNOSIS — H9209 Otalgia, unspecified ear: Secondary | ICD-10-CM | POA: Insufficient documentation

## 2013-05-20 DIAGNOSIS — E119 Type 2 diabetes mellitus without complications: Secondary | ICD-10-CM | POA: Insufficient documentation

## 2013-05-20 DIAGNOSIS — I2510 Atherosclerotic heart disease of native coronary artery without angina pectoris: Secondary | ICD-10-CM | POA: Insufficient documentation

## 2013-05-20 DIAGNOSIS — H663X9 Other chronic suppurative otitis media, unspecified ear: Principal | ICD-10-CM | POA: Insufficient documentation

## 2013-05-20 DIAGNOSIS — H9190 Unspecified hearing loss, unspecified ear: Secondary | ICD-10-CM | POA: Insufficient documentation

## 2013-05-20 DIAGNOSIS — Z951 Presence of aortocoronary bypass graft: Secondary | ICD-10-CM | POA: Insufficient documentation

## 2013-05-20 DIAGNOSIS — H903 Sensorineural hearing loss, bilateral: Secondary | ICD-10-CM | POA: Insufficient documentation

## 2013-05-20 HISTORY — PX: TYMPANOMASTOIDECTOMY: SHX34

## 2013-05-20 LAB — GLUCOSE, CAPILLARY: Glucose-Capillary: 85 mg/dL (ref 70–99)

## 2013-05-20 SURGERY — TYMPANOPLASTY, WITH MASTOIDECTOMY
Anesthesia: General | Site: Ear | Laterality: Right | Wound class: Clean

## 2013-05-20 MED ORDER — EPHEDRINE SULFATE 50 MG/ML IJ SOLN
INTRAMUSCULAR | Status: DC | PRN
  Administered 2013-05-20 (×2): 5 mg via INTRAVENOUS
  Administered 2013-05-20: 2.5 mg via INTRAVENOUS
  Administered 2013-05-20 (×2): 5 mg via INTRAVENOUS
  Administered 2013-05-20: 10 mg via INTRAVENOUS

## 2013-05-20 MED ORDER — ONDANSETRON HCL 4 MG/2ML IJ SOLN
INTRAMUSCULAR | Status: AC
  Filled 2013-05-20: qty 2

## 2013-05-20 MED ORDER — ACETAMINOPHEN 500 MG PO TABS: 1000.0000 mg | ORAL_TABLET | Freq: Four times a day (QID) | ORAL | Status: DC | PRN

## 2013-05-20 MED ORDER — FENTANYL CITRATE 0.05 MG/ML IJ SOLN
INTRAMUSCULAR | Status: DC | PRN
  Administered 2013-05-20: 25 ug via INTRAVENOUS
  Administered 2013-05-20: 50 ug via INTRAVENOUS
  Administered 2013-05-20: 100 ug via INTRAVENOUS
  Administered 2013-05-20 (×2): 50 ug via INTRAVENOUS
  Administered 2013-05-20: 25 ug via INTRAVENOUS

## 2013-05-20 MED ORDER — EPINEPHRINE HCL 1 MG/ML IJ SOLN
INTRAMUSCULAR | Status: AC
  Filled 2013-05-20: qty 1

## 2013-05-20 MED ORDER — LIDOCAINE-EPINEPHRINE 1 %-1:100000 IJ SOLN
INTRAMUSCULAR | Status: DC | PRN
  Administered 2013-05-20: 6.5 mL

## 2013-05-20 MED ORDER — ZOLPIDEM TARTRATE 5 MG PO TABS: 5.0000 mg | ORAL_TABLET | Freq: Every evening | ORAL | Status: DC | PRN

## 2013-05-20 MED ORDER — CIPROFLOXACIN-HYDROCORTISONE 0.2-1 % OT SUSP: OTIC | Status: DC | PRN

## 2013-05-20 MED ORDER — DEXTROSE IN LACTATED RINGERS 5 % IV SOLN: INTRAVENOUS | Status: DC

## 2013-05-20 MED ORDER — MIDAZOLAM HCL 5 MG/5ML IJ SOLN
INTRAMUSCULAR | Status: DC | PRN
  Administered 2013-05-20: 2 mg via INTRAVENOUS

## 2013-05-20 MED ORDER — OXYCODONE HCL 5 MG PO TABS: 5.0000 mg | ORAL_TABLET | Freq: Once | ORAL | Status: DC | PRN

## 2013-05-20 MED ORDER — HYDROMORPHONE HCL PF 1 MG/ML IJ SOLN
INTRAMUSCULAR | Status: AC
  Administered 2013-05-20: 0.5 mg via INTRAVENOUS
  Filled 2013-05-20: qty 1

## 2013-05-20 MED ORDER — LIDOCAINE HCL 2 % IJ SOLN
INTRAMUSCULAR | Status: DC | PRN
  Administered 2013-05-20: 12.5 mL

## 2013-05-20 MED ORDER — METHYLENE BLUE 1 % INJ SOLN
INTRAMUSCULAR | Status: AC
  Filled 2013-05-20: qty 10

## 2013-05-20 MED ORDER — LACTATED RINGERS IV SOLN
INTRAVENOUS | Status: DC | PRN
  Administered 2013-05-20 (×2): via INTRAVENOUS

## 2013-05-20 MED ORDER — LIDOCAINE HCL (CARDIAC) 20 MG/ML IV SOLN
INTRAVENOUS | Status: DC | PRN
  Administered 2013-05-20: 100 mg via INTRAVENOUS

## 2013-05-20 MED ORDER — MORPHINE SULFATE 2 MG/ML IJ SOLN
2.0000 mg | INTRAMUSCULAR | Status: DC | PRN
  Administered 2013-05-20 (×2): 2 mg via INTRAVENOUS
  Administered 2013-05-21: 4 mg via INTRAVENOUS
  Filled 2013-05-20: qty 1
  Filled 2013-05-20: qty 2
  Filled 2013-05-20: qty 1

## 2013-05-20 MED ORDER — LIDOCAINE-EPINEPHRINE (PF) 1 %-1:200000 IJ SOLN
INTRAMUSCULAR | Status: AC
  Filled 2013-05-20: qty 10

## 2013-05-20 MED ORDER — LIDOCAINE-EPINEPHRINE 1 %-1:100000 IJ SOLN
INTRAMUSCULAR | Status: AC
  Filled 2013-05-20: qty 1

## 2013-05-20 MED ORDER — ONDANSETRON HCL 4 MG/2ML IJ SOLN
INTRAMUSCULAR | Status: DC | PRN
  Administered 2013-05-20: 4 mg via INTRAVENOUS

## 2013-05-20 MED ORDER — LIDOCAINE-EPINEPHRINE 0.5 %-1:200000 IJ SOLN
INTRAMUSCULAR | Status: AC
  Filled 2013-05-20: qty 1

## 2013-05-20 MED ORDER — TRAMADOL HCL 50 MG PO TABS
50.0000 mg | ORAL_TABLET | Freq: Four times a day (QID) | ORAL | Status: DC
  Administered 2013-05-20 – 2013-05-21 (×3): 50 mg via ORAL
  Filled 2013-05-20 (×3): qty 1

## 2013-05-20 MED ORDER — OXYCODONE HCL 5 MG/5ML PO SOLN: 5.0000 mg | Freq: Once | ORAL | Status: DC | PRN

## 2013-05-20 MED ORDER — GABAPENTIN 100 MG PO CAPS
100.0000 mg | ORAL_CAPSULE | Freq: Every day | ORAL | Status: DC
  Administered 2013-05-20: 100 mg via ORAL
  Filled 2013-05-20 (×2): qty 1

## 2013-05-20 MED ORDER — NITROGLYCERIN 0.4 MG SL SUBL: 0.4000 mg | SUBLINGUAL_TABLET | SUBLINGUAL | Status: DC | PRN

## 2013-05-20 MED ORDER — HEPARIN SODIUM (PORCINE) 5000 UNIT/ML IJ SOLN
5000.0000 [IU] | Freq: Three times a day (TID) | INTRAMUSCULAR | Status: DC
  Administered 2013-05-20 – 2013-05-21 (×2): 5000 [IU] via SUBCUTANEOUS
  Filled 2013-05-20 (×5): qty 1

## 2013-05-20 MED ORDER — PROPOFOL INFUSION 10 MG/ML OPTIME
INTRAVENOUS | Status: DC | PRN
  Administered 2013-05-20: 25 ug/kg/min via INTRAVENOUS

## 2013-05-20 MED ORDER — WHITE PETROLATUM GEL
Status: AC
  Administered 2013-05-20: 17:00:00
  Filled 2013-05-20: qty 5

## 2013-05-20 MED ORDER — SODIUM CHLORIDE 0.9 % IR SOLN
Status: DC | PRN
  Administered 2013-05-20: 11:00:00

## 2013-05-20 MED ORDER — EPINEPHRINE HCL 1 MG/ML IJ SOLN
INTRAMUSCULAR | Status: DC | PRN
  Administered 2013-05-20: .25 mL via SUBCUTANEOUS

## 2013-05-20 MED ORDER — CIPROFLOXACIN-DEXAMETHASONE 0.3-0.1 % OT SUSP
OTIC | Status: DC | PRN
  Administered 2013-05-20: 4 [drp] via OTIC

## 2013-05-20 MED ORDER — PHENOL 1.4 % MT LIQD: 1.0000 | OROMUCOSAL | Status: DC | PRN

## 2013-05-20 MED ORDER — PANTOPRAZOLE SODIUM 40 MG PO TBEC
40.0000 mg | DELAYED_RELEASE_TABLET | Freq: Every evening | ORAL | Status: DC
  Administered 2013-05-20: 40 mg via ORAL
  Filled 2013-05-20: qty 1

## 2013-05-20 MED ORDER — ONDANSETRON HCL 4 MG/2ML IJ SOLN
4.0000 mg | Freq: Once | INTRAMUSCULAR | Status: DC
  Filled 2013-05-20: qty 2

## 2013-05-20 MED ORDER — PROPOFOL 10 MG/ML IV BOLUS
INTRAVENOUS | Status: DC | PRN
  Administered 2013-05-20: 110 mg via INTRAVENOUS

## 2013-05-20 MED ORDER — METHYLENE BLUE 1 % INJ SOLN
INTRAMUSCULAR | Status: DC | PRN
  Administered 2013-05-20: .1 mL

## 2013-05-20 MED ORDER — ONDANSETRON HCL 4 MG/2ML IJ SOLN: 4.0000 mg | Freq: Once | INTRAMUSCULAR | Status: DC | PRN

## 2013-05-20 MED ORDER — SUCCINYLCHOLINE CHLORIDE 20 MG/ML IJ SOLN
INTRAMUSCULAR | Status: DC | PRN
  Administered 2013-05-20: 140 mg via INTRAVENOUS

## 2013-05-20 MED ORDER — DOCUSATE SODIUM 100 MG PO CAPS: 200.0000 mg | ORAL_CAPSULE | Freq: Every day | ORAL | Status: DC | PRN

## 2013-05-20 MED ORDER — CEFAZOLIN (ANCEF) 1 G IV SOLR
2.0000 g | INTRAVENOUS | Status: DC
  Filled 2013-05-20: qty 2

## 2013-05-20 MED ORDER — GEMFIBROZIL 600 MG PO TABS
600.0000 mg | ORAL_TABLET | Freq: Two times a day (BID) | ORAL | Status: DC
  Administered 2013-05-20 – 2013-05-21 (×2): 600 mg via ORAL
  Filled 2013-05-20 (×4): qty 1

## 2013-05-20 MED ORDER — SODIUM CHLORIDE 0.9 % IR SOLN
Status: DC | PRN
  Administered 2013-05-20: 1000 mL

## 2013-05-20 MED ORDER — METOPROLOL TARTRATE 25 MG PO TABS
25.0000 mg | ORAL_TABLET | Freq: Two times a day (BID) | ORAL | Status: DC
  Administered 2013-05-20 – 2013-05-21 (×2): 25 mg via ORAL
  Filled 2013-05-20 (×3): qty 1

## 2013-05-20 MED ORDER — TRAZODONE HCL 50 MG PO TABS
50.0000 mg | ORAL_TABLET | Freq: Every day | ORAL | Status: DC
  Administered 2013-05-20: 50 mg via ORAL
  Filled 2013-05-20 (×2): qty 1

## 2013-05-20 MED ORDER — ADULT MULTIVITAMIN W/MINERALS CH
1.0000 | ORAL_TABLET | Freq: Every day | ORAL | Status: DC
  Administered 2013-05-20 – 2013-05-21 (×2): 1 via ORAL
  Filled 2013-05-20 (×2): qty 1

## 2013-05-20 MED ORDER — CEFAZOLIN SODIUM-DEXTROSE 2-3 GM-% IV SOLR
INTRAVENOUS | Status: AC
  Administered 2013-05-20: 2 g via INTRAVENOUS
  Filled 2013-05-20: qty 50

## 2013-05-20 MED ORDER — BUPROPION HCL ER (XL) 300 MG PO TB24
300.0000 mg | ORAL_TABLET | Freq: Every day | ORAL | Status: DC
  Administered 2013-05-20 – 2013-05-21 (×2): 300 mg via ORAL
  Filled 2013-05-20 (×2): qty 1

## 2013-05-20 MED ORDER — HYDROMORPHONE HCL PF 1 MG/ML IJ SOLN
0.2500 mg | INTRAMUSCULAR | Status: DC | PRN
  Administered 2013-05-20 (×2): 0.5 mg via INTRAVENOUS

## 2013-05-20 MED ORDER — SCOPOLAMINE 1 MG/3DAYS TD PT72
1.0000 | MEDICATED_PATCH | Freq: Once | TRANSDERMAL | Status: DC
  Filled 2013-05-20: qty 1

## 2013-05-20 MED ORDER — GLIMEPIRIDE 4 MG PO TABS
4.0000 mg | ORAL_TABLET | Freq: Every day | ORAL | Status: DC
  Administered 2013-05-21: 4 mg via ORAL
  Filled 2013-05-20 (×2): qty 1

## 2013-05-20 MED ORDER — BACITRACIN ZINC 500 UNIT/GM EX OINT
TOPICAL_OINTMENT | CUTANEOUS | Status: DC | PRN
  Administered 2013-05-20: 1 via TOPICAL

## 2013-05-20 MED ORDER — CIPROFLOXACIN-DEXAMETHASONE 0.3-0.1 % OT SUSP
OTIC | Status: AC
  Filled 2013-05-20: qty 7.5

## 2013-05-20 MED ORDER — CLOPIDOGREL BISULFATE 75 MG PO TABS
75.0000 mg | ORAL_TABLET | Freq: Every day | ORAL | Status: DC
  Administered 2013-05-21: 75 mg via ORAL
  Filled 2013-05-20 (×2): qty 1

## 2013-05-20 MED ORDER — HYDROCODONE-ACETAMINOPHEN 5-325 MG PO TABS
1.0000 | ORAL_TABLET | ORAL | Status: DC | PRN
  Administered 2013-05-20: 2 via ORAL
  Filled 2013-05-20: qty 2

## 2013-05-20 MED ORDER — SCOPOLAMINE 1 MG/3DAYS TD PT72
MEDICATED_PATCH | TRANSDERMAL | Status: DC | PRN
  Administered 2013-05-20: 1 via TRANSDERMAL

## 2013-05-20 MED ORDER — ISOSORBIDE MONONITRATE ER 30 MG PO TB24
30.0000 mg | ORAL_TABLET | Freq: Two times a day (BID) | ORAL | Status: DC
  Administered 2013-05-20 – 2013-05-21 (×2): 30 mg via ORAL
  Filled 2013-05-20 (×3): qty 1

## 2013-05-20 MED ORDER — LIDOCAINE HCL 2 % IJ SOLN
INTRAMUSCULAR | Status: AC
  Filled 2013-05-20: qty 20

## 2013-05-20 MED ORDER — METFORMIN HCL 500 MG PO TABS
1000.0000 mg | ORAL_TABLET | Freq: Two times a day (BID) | ORAL | Status: DC
  Administered 2013-05-20 – 2013-05-21 (×2): 1000 mg via ORAL
  Filled 2013-05-20 (×4): qty 2

## 2013-05-20 MED ORDER — ARTIFICIAL TEARS OP OINT
TOPICAL_OINTMENT | OPHTHALMIC | Status: DC | PRN
  Administered 2013-05-20: 1 via OPHTHALMIC

## 2013-05-20 MED ORDER — MEPERIDINE HCL 25 MG/ML IJ SOLN: 6.2500 mg | INTRAMUSCULAR | Status: DC | PRN

## 2013-05-20 SURGICAL SUPPLY — 78 items
BAG DECANTER FOR FLEXI CONT (MISCELLANEOUS) ×3 IMPLANT
BALL CTTN LRG ABS STRL LF (GAUZE/BANDAGES/DRESSINGS) ×2
BLADE NDL 3 SS STRL (BLADE) ×1 IMPLANT
BLADE NEEDLE 3 SS STRL (BLADE) ×2 IMPLANT
BLADE SURG ROTATE 9660 (MISCELLANEOUS) ×2 IMPLANT
BUR RND FLUTED 2.5 (BURR) ×1 IMPLANT
BUR ROUND FLUTED 4 SOFT TCH (BURR) ×1 IMPLANT
BUR ROUND FLUTED 5 RND (BURR) ×1 IMPLANT
BUR SABER DIAMOND 3.0 (BURR) ×1 IMPLANT
BUR SABER DIAMOND 5.0 (BURR) ×1 IMPLANT
BUR SABER RD CUTTING 3.0 (BURR) ×1 IMPLANT
CANISTER SUCTION 2500CC (MISCELLANEOUS) ×2 IMPLANT
CONT SPEC 4OZ CLIKSEAL STRL BL (MISCELLANEOUS) IMPLANT
CORDS BIPOLAR (ELECTRODE) ×2 IMPLANT
COTTONBALL LRG STERILE PKG (GAUZE/BANDAGES/DRESSINGS) ×3 IMPLANT
COVER SURGICAL LIGHT HANDLE (MISCELLANEOUS) ×2 IMPLANT
CRADLE DONUT ADULT HEAD (MISCELLANEOUS) ×2 IMPLANT
DRAIN PENROSE 1/4X12 LTX STRL (WOUND CARE) IMPLANT
DRAPE INCISE IOBAN 66X45 STRL (DRAPES) IMPLANT
DRAPE MICROSCOPE LEICA 54X105 (DRAPE) ×2 IMPLANT
DRAPE POUCH INSTRU U-SHP 10X18 (DRAPES) ×2 IMPLANT
DRAPE SURG 17X11 SM STRL (DRAPES) ×2 IMPLANT
DRAPE SURG 17X23 STRL (DRAPES) ×3 IMPLANT
DRSG GLASSCOCK MASTOID ADT (GAUZE/BANDAGES/DRESSINGS) ×1 IMPLANT
DRSG GLASSCOCK MASTOID PED (GAUZE/BANDAGES/DRESSINGS) IMPLANT
ELECT COATED BLADE 2.86 ST (ELECTRODE) ×2 IMPLANT
ELECT PAIRED SUBDERMAL (MISCELLANEOUS)
ELECT REM PT RETURN 9FT ADLT (ELECTROSURGICAL) ×2
ELECTRODE PAIRED SUBDERMAL (MISCELLANEOUS) IMPLANT
ELECTRODE REM PT RTRN 9FT ADLT (ELECTROSURGICAL) ×1 IMPLANT
GAUZE PACKING IODOFORM 1/4X5 (PACKING) ×3 IMPLANT
GLOVE BIOGEL PI IND STRL 7.0 (GLOVE) IMPLANT
GLOVE BIOGEL PI INDICATOR 7.0 (GLOVE) ×2
GLOVE ECLIPSE 7.5 STRL STRAW (GLOVE) ×2 IMPLANT
GLOVE SURG SS PI 7.0 STRL IVOR (GLOVE) ×2 IMPLANT
GOWN STRL NON-REIN LRG LVL3 (GOWN DISPOSABLE) ×4 IMPLANT
KIT BASIN OR (CUSTOM PROCEDURE TRAY) ×2 IMPLANT
KIT ROOM TURNOVER OR (KITS) ×2 IMPLANT
MARKER SKIN DUAL TIP RULER LAB (MISCELLANEOUS) ×1 IMPLANT
NDL 18GX1X1/2 (RX/OR ONLY) (NEEDLE) IMPLANT
NDL 25GX 5/8IN NON SAFETY (NEEDLE) IMPLANT
NDL FILTER BLUNT 18X1 1/2 (NEEDLE) IMPLANT
NDL HYPO 25GX1X1/2 BEV (NEEDLE) ×1 IMPLANT
NDL SAFETY ECLIPSE 18X1.5 (NEEDLE) IMPLANT
NEEDLE 18GX1X1/2 (RX/OR ONLY) (NEEDLE) ×2 IMPLANT
NEEDLE 22X1 1/2 (OR ONLY) (NEEDLE) ×2 IMPLANT
NEEDLE 25GX 5/8IN NON SAFETY (NEEDLE) IMPLANT
NEEDLE 27GAX1X1/2 (NEEDLE) ×2 IMPLANT
NEEDLE FILTER BLUNT 18X 1/2SAF (NEEDLE) ×1
NEEDLE FILTER BLUNT 18X1 1/2 (NEEDLE) ×1 IMPLANT
NEEDLE HYPO 18GX1.5 SHARP (NEEDLE) ×2
NEEDLE HYPO 25GX1X1/2 BEV (NEEDLE) ×4 IMPLANT
NS IRRIG 1000ML POUR BTL (IV SOLUTION) ×2 IMPLANT
PAD ARMBOARD 7.5X6 YLW CONV (MISCELLANEOUS) ×4 IMPLANT
PENCIL BUTTON HOLSTER BLD 10FT (ELECTRODE) ×3 IMPLANT
PROBE NERVBE PRASS .33 (MISCELLANEOUS) IMPLANT
SET CYSTO W/LG BORE CLAMP LF (SET/KITS/TRAYS/PACK) ×2 IMPLANT
SPECIMEN JAR SMALL (MISCELLANEOUS) ×4 IMPLANT
SPONGE GAUZE 4X4 12PLY (GAUZE/BANDAGES/DRESSINGS) IMPLANT
SPONGE SURGIFOAM ABS GEL 12-7 (HEMOSTASIS) ×2 IMPLANT
STOCKINETTE TUBULAR 6 INCH (GAUZE/BANDAGES/DRESSINGS) ×2 IMPLANT
STRIP CLOSURE SKIN 1/2X4 (GAUZE/BANDAGES/DRESSINGS) ×2 IMPLANT
SUT BONE WAX W31G (SUTURE) ×2 IMPLANT
SUT CHROMIC 3 0 PS 2 (SUTURE) ×2 IMPLANT
SUT ETHILON 5 0 PS 2 18 (SUTURE) ×3 IMPLANT
SUT VIC AB 3-0 PS2 18 (SUTURE) ×4
SUT VIC AB 3-0 PS2 18XBRD (SUTURE) ×2 IMPLANT
SYR 3ML LL SCALE MARK (SYRINGE) ×2 IMPLANT
SYR BULB 3OZ (MISCELLANEOUS) ×1 IMPLANT
SYR TB 1ML LUER SLIP (SYRINGE) ×4 IMPLANT
SYRINGE 10CC LL (SYRINGE) ×1 IMPLANT
TOWEL OR 17X24 6PK STRL BLUE (TOWEL DISPOSABLE) ×2 IMPLANT
TOWEL OR 17X26 10 PK STRL BLUE (TOWEL DISPOSABLE) ×2 IMPLANT
TRAY ENT MC OR (CUSTOM PROCEDURE TRAY) ×2 IMPLANT
TUBE EAR PAPARELLA TYPE 1 (OTOLOGIC RELATED) ×1 IMPLANT
TUBING EXTENTION W/L.L. (IV SETS) ×2 IMPLANT
TUBING IRRIGATION (MISCELLANEOUS) ×2 IMPLANT
WIPE INSTRUMENT VISIWIPE 73X73 (MISCELLANEOUS) ×2 IMPLANT

## 2013-05-20 NOTE — Transfer of Care (Signed)
Immediate Anesthesia Transfer of Care Note  Patient: [name]  Procedure(s) Performed: Procedure(s): RIGHT CANAL UP TYMPANOMASTOIDECTOMY WITH TYPE I TYMPANOPLASTY AND INSERTION OF A TUBE (Right)  Patient Location: PACU  Anesthesia Type:General  Level of Consciousness: awake, alert  and oriented  Airway & Oxygen Therapy: Patient Spontanous Breathing and Patient connected to face mask oxygen  Post-op Assessment: Report given to PACU RN  Post vital signs: Reviewed and stable  Complications: No apparent anesthesia complications

## 2013-05-20 NOTE — Preoperative (Signed)
Beta Blockers   Reason not to administer Beta Blockers:Not Applicable 

## 2013-05-20 NOTE — Brief Op Note (Signed)
05/20/2013  1:34 PM  PATIENT:  [name]  62 y.o. male  PRE-OPERATIVE DIAGNOSIS:  Chronic mastoiditis, right  POST-OPERATIVE DIAGNOSIS:  Chronic mastoiditis, right  PROCEDURE:  Procedure(s): RIGHT CANAL UP TYMPANOMASTOIDECTOMY WITH TYPE I TYMPANOPLASTY AND INSERTION OF A TUBE (Right), microdissection using the OR microscope  SURGEON:  Surgeon(s) and Role:    * Carolan Shiver, MD - Primary  PHYSICIAN ASSISTANT:   ASSISTANTS: none   ANESTHESIA:   general  EBL:  Total I/O In: 1700 [I.V.:1700] Out: 630 [Urine:600; Blood:30]  BLOOD ADMINISTERED:none  DRAINS: Penrose drain in the R mastoid cavity   LOCAL MEDICATIONS USED:  XYLOCAINE   SPECIMEN:  Source of Specimen:  R mastoid & middle ear specimens  DISPOSITION OF SPECIMEN:  PATHOLOGY  COUNTS:  YES  TOURNIQUET:  * No tourniquets in log *  DICTATION: .Other Dictation: Dictation Number [id number]  PLAN OF CARE: Admit for overnight observation  PATIENT DISPOSITION:  PACU - hemodynamically stable.   Delay start of Pharmacological VTE agent (>24hrs) due to surgical blood loss or risk of bleeding: yes

## 2013-05-20 NOTE — Anesthesia Preprocedure Evaluation (Signed)
Anesthesia Evaluation  Patient identified by MRN, date of birth, ID band Patient awake    Reviewed: Allergy & Precautions, H&P , NPO status , Patient's Chart, lab work & pertinent test results  Airway Mallampati: I TM Distance: >3 FB Neck ROM: Full    Dental   Pulmonary COPDformer smoker,          Cardiovascular hypertension, Pt. on medications + Past MI     Neuro/Psych    GI/Hepatic GERD-  Medicated and Controlled,  Endo/Other  diabetes, Poorly Controlled, Type 2, Oral Hypoglycemic Agents  Renal/GU      Musculoskeletal   Abdominal   Peds  Hematology   Anesthesia Other Findings   Reproductive/Obstetrics                           Anesthesia Physical Anesthesia Plan  ASA: III  Anesthesia Plan: General   Post-op Pain Management:    Induction: Intravenous  Airway Management Planned: Oral ETT  Additional Equipment:   Intra-op Plan:   Post-operative Plan: Extubation in OR  Informed Consent: I have reviewed the patients History and Physical, chart, labs and discussed the procedure including the risks, benefits and alternatives for the proposed anesthesia with the patient or authorized representative who has indicated his/her understanding and acceptance.     Plan Discussed with: CRNA and Surgeon  Anesthesia Plan Comments:         Anesthesia Quick Evaluation

## 2013-05-20 NOTE — Interval H&P Note (Signed)
History and Physical Interval Note:  05/20/2013 8:42 AM  [name]  has presented today for surgery, with the diagnosis of Chronic mastoiditis, right ear.  The various methods of treatment have been discussed with the patient and wife. After consideration of risks, benefits and other options for treatment, the patient has consented to  Procedure(s): RIGHT CANAL UP TYMPANOMASTOIDECTOMY WITH TYPE I TYMPANOPLASTY AND INSERTION OF A TUBE (Right) as a surgical intervention .  The patient's history has been reviewed, patient examined, no change in status, stable for surgery.  I have reviewed the patient's chart and labs.  Questions were answered to the patient's and wife's satisfaction.     [name], Shawnita Krizek M

## 2013-05-20 NOTE — Progress Notes (Signed)
S: Stable as per nurse's report, drinking, no chest pain, voided, IV ok, received MS within the hour  O: Awake, alert, VS stable, VII th nerve intact, some drainage as expected  A: 1. Stable postop course     2. No cardiac issues thus far  P: 1. Nurse may reinforce dressing.      2. Plan drain removal in am.      3. Plan DC in am if stable overnight.

## 2013-05-21 LAB — GLUCOSE, CAPILLARY
Glucose-Capillary: 90 mg/dL (ref 70–99)
Glucose-Capillary: 183 mg/dL — ABNORMAL HIGH (ref 70–99)

## 2013-05-21 MED ORDER — PHENOL 1.4 % MT LIQD: 1.0000 | OROMUCOSAL | Status: DC | PRN

## 2013-05-21 MED ORDER — CIPROFLOXACIN-DEXAMETHASONE 0.3-0.1 % OT SUSP
3.0000 [drp] | Freq: Two times a day (BID) | OTIC | Status: DC
  Filled 2013-05-21: qty 7.5

## 2013-05-21 MED ORDER — HYDROCODONE-ACETAMINOPHEN 5-325 MG PO TABS: 1.0000 | ORAL_TABLET | ORAL | Status: DC | PRN

## 2013-05-21 MED ORDER — CIPROFLOXACIN-DEXAMETHASONE 0.3-0.1 % OT SUSP: 3.0000 [drp] | Freq: Two times a day (BID) | OTIC | Status: DC

## 2013-05-21 MED ORDER — MUPIROCIN 2 % EX OINT: 1.0000 "application " | TOPICAL_OINTMENT | Freq: Two times a day (BID) | CUTANEOUS | Status: DC

## 2013-05-21 NOTE — Discharge Summary (Signed)
Physician Discharge Summary  Patient ID: [name] MRN: [id number] DOB/AGE: Apr 18, 1951 62 y.o.  Admit date: 05/20/2013 Discharge date: 05/21/2013  Admission Diagnoses: 1. Chronic suppurative otitis media & chronic mastoiditis AD                                         2. CAD s/p CABG x 2                                         3. Type II Diabetes mellitus  Discharge Diagnoses: same Active Problems:   * No active hospital problems. *   Complications none  Discharged Condition: stable  Hospital Course: uncomplicated R canal up tympanomastoidectomy with type I tympanoplasty. Recovered in PACU without difficulty. Stable overnight course in stepdown unit, Cs08. Discharged in stable condition. No cardiac or post anesthesia events.  Consults: None  Significant Diagnostic Studies: labs: wnl  Treatments: surgery: R canal up tympanomastoidectomy with type I medial fascia graft tympanoplasty, UMT AD, microdissection using the OR microscope  Discharge Exam: Blood pressure 129/97, pulse 51, temperature 97.8 F (36.6 C), temperature source Oral, resp. rate 14, height 5\' 8"  (1.727 m), weight 84.369 kg (186 lb), SpO2 99.00%.  1. R ear stable, incision dry & without hematoma, drain removed, facial function intact, no nystagmus  Disposition: 01-Home or Self Care  Diet  Regular ADA diet  Room Locations Short stay, Rm 8 in OR, PACU, Rm Cs08 stepdown  Activity quiet indoor activity     Medication List    ASK your doctor about these medications       acetaminophen 500 MG tablet  Commonly known as:  TYLENOL  Take 1,000 mg by mouth every 6 (six) hours as needed. (for chest wall pain)     aspirin 81 MG EC tablet  Take 81 mg by mouth daily.     buPROPion 300 MG 24 hr tablet  Commonly known as:  WELLBUTRIN XL  Take 300 mg by mouth daily.     clopidogrel 75 MG tablet  Commonly known as:  PLAVIX  Take 75 mg by mouth daily.     docusate sodium 100 MG capsule  Commonly known as:   COLACE  Take 200 mg by mouth daily as needed.     gabapentin 100 MG capsule  Commonly known as:  NEURONTIN  Take 100 mg by mouth at bedtime.     gemfibrozil 600 MG tablet  Commonly known as:  LOPID  Take 600 mg by mouth 2 (two) times daily before a meal.     glimepiride 4 MG tablet  Commonly known as:  AMARYL  Take 4 mg by mouth daily with breakfast.     isosorbide mononitrate 30 MG 24 hr tablet  Commonly known as:  IMDUR  Take 1 tablet (30 mg total) by mouth 2 (two) times daily.     metFORMIN 1000 MG tablet  Commonly known as:  GLUCOPHAGE  Take 1,000 mg by mouth 2 (two) times daily with a meal.     metoprolol tartrate 25 MG tablet  Commonly known as:  LOPRESSOR  Take 25 mg by mouth 2 (two) times daily.     multivitamin tablet  Take 1 tablet by mouth daily.     nitroGLYCERIN 0.4 MG SL tablet  Commonly  known as:  NITROSTAT  Place 0.4 mg under the tongue every 5 (five) minutes as needed. For chest pain     pantoprazole 40 MG tablet  Commonly known as:  PROTONIX  Take 40 mg by mouth every evening.     traMADol 50 MG tablet  Commonly known as:  ULTRAM  Take 50 mg by mouth 4 (four) times daily.     traZODone 50 MG tablet  Commonly known as:  DESYREL  Take 50 mg by mouth at bedtime.         SignedDorma Russell, [name] M 05/21/2013, 7:09 AM

## 2013-05-21 NOTE — Progress Notes (Signed)
Discharge instructions given to wife and patient with teach back.  All questions answered.  No complaints.

## 2013-05-21 NOTE — Anesthesia Postprocedure Evaluation (Signed)
  Anesthesia Post-op Note  Patient: [name]  Procedure(s) Performed: Procedure(s): RIGHT CANAL UP TYMPANOMASTOIDECTOMY WITH TYPE I TYMPANOPLASTY AND INSERTION OF A TUBE (Right)  Patient Location: ICU  Anesthesia Type:General  Level of Consciousness: awake, alert  and oriented  Airway and Oxygen Therapy: Patient Spontanous Breathing  Post-op Pain: none  Post-op Assessment: Post-op Vital signs reviewed, Patient's Cardiovascular Status Stable, Respiratory Function Stable, Patent Airway, No signs of Nausea or vomiting and Pain level controlled  Post-op Vital Signs: Reviewed and stable  Complications: No apparent anesthesia complications

## 2013-05-21 NOTE — Op Note (Signed)
NAMELUCIFER, [name] NO.:  [id number]  MEDICAL RECORD NO.:  [id number]  LOCATION:  3S08C                        FACILITY:  [facility]  PHYSICIAN:  Carolan Shiver, M.D.    DATE OF BIRTH:  19-Sep-1950  DATE OF PROCEDURE: 05-20-2013 DATE OF DISCHARGE: 05-21-2013                              OPERATIVE REPORT   JUSTIFICATION FOR PROCEDURE:  [name] is a 62 year old, white male, who is here today for a right canal tympanomastoidectomy, type 1 tympanoplasty and unilateral myringotomy and transtympanic tubes, right ear to treat chronic mastoiditis and suppurative otitis media.  The patient has developed chronic otorrhea with bleeding and yellow drainage from his right ear with decreased hearing.  He also had a history of chronic ear pain, hearing loss, and repeat falling.  The patient is a type 2 diabetic and has had a CABG with stents in his vein grafts as well as a revision CABG.  On physical examination, he was found to have chronic suppurative otitis media right ear with a 25% anterior inferior perforation.  CT scan of his temporal bones documented, chronic mastoiditis and chronic suppurative otitis media. A culture grew methicillin-sensitive Staph aureus.  The patient is cared for by [facility] in [place], [place].  He underwent a preanesthesia clearance by that group and was recommended for a right canal-up tympanomastoidectomy, a type 1 tympanoplasty to close the large anterior- inferior perforation and placement of a Paparella Type I tube.  Risks, complications, and alternatives of the procedure were explained to the patient and to his wife.  Questions were invited and answered.  Informed consent was signed and witnessed.  He was taken off anticoagulants 1 week ago.  Preoperative audiometric testing documented moderate mixed hearing loss in the right ear with an SRT of 45 decibels and 80% discrimination @ 85 dB HTL.  The patient also had   moderately severe high-frequency sensorineural hearing loss in his left ear with an SRT of 30 decibels and 88% discrimination at 70 dB HTL.  JUSTIFICATION FOR INPATIENT SETTING: 1. The patient's cardiac history, need for general endotracheal anesthesia. 2. Length of anesthesia. 3. The patient will require postoperative cardiac monitoring in an ICU     step-down unit.  PREOPERATIVE DIAGNOSES: 1. Chronic suppurative otitis media with anterior-inferior perforation     and chronic mastoiditis, right ear. 2. Diabetes mellitus, type 2. 3. Coronary artery disease status post coronary artery bypass grafting     with stents in his vein grafts as well as revision coronary artery     bypass grafting. 4. Moderate to moderately severe mixed hearing loss, right ear. 5. 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  POSTOPERATIVE DIAGNOSES: 1. Chronic suppurative otitis media with anterior-inferior perforation     and chronic mastoiditis, right ear.  2.  Diabetes mellitus, type 2. 3. Coronary artery disease status post coronary artery bypass grafting     with stents in his vein grafts as well as revision coronary artery     bypass grafting. 4. Moderate to moderately severe mixed hearing loss, right ear. 5. 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  OPERATION: 1. Right canal tympanomastoidectomy with type  1 tympanoplasty and     insertion of a Paparella type 1 tube right ear. 2. Microdissection using the operating room microscope.  SURGEON:  Carolan Shiver, M.D.  ANESTHESIA:  General endotracheal, Dr. Michelle Piper.  CRNA:  Greig Right.  COMPLICATIONS:  None.  DISCHARGE STATUS:  Stable.  SUMMARY OF PROCEDURE:  After the patient was taken to the operating room, he was placed in the supine position.  An IV had been begun in the holding area.  General IV induction was performed by Dr. Michelle Piper, and the patient was orally intubated by [facility] without  difficulty.  He was properly positioned and monitored, elbows, ankles were padded with foam Rubber, and I initiated a time-out at 9:20 a.m.  The patient was then turned 90 degrees counterclockwise.  A small amount of hair was clipped and the right postauricular areas hair was taped and a stockinette cap was applied.  His right external ear and hemiface were then prepped with chlorhexidine and a right postauricular incision was marked and infiltrated with 6.5 mL of 1% Xylocaine with 1:100,000 epinephrine.  Silver wire needle electrodes were then inserted into the right orbicularis oculi and oris muscles and suprasternal notch and connected to the NIM facial nerve monitor preamplifier.  The circulating nurse then inserted a Foley catheter without difficulty.  The patient's right ear and hemiface were then prepped with Betadine and draped in a standard fashion for a canal-up tympanomastoidectomy.  Examination of the right ear canal revealed a 5.5 mm diameter canal that was dilated to 6 mm.  The canal was cleaned and debrided.  There was a 30% anterior inferior perforation.  Four quadrant ear canal blocks were then performed with 1 mL of 2% Xylocaine with 1:50,000 epinephrine.  The epithelial margin of the perforation was then excised by postage stamping around the margin of the perforation with a curved Turner needle and the epithelial rim was then removed with micro cup forceps.  A right postauricular incision was then made and carried down through skin and subcutaneous tissue.  A T-shaped incision was made in the muscular periosteum layer and anterior and posterior subperiosteal flaps were elevated.  A temporalis fascia graft was harvested in the standard fashion, then pressed between tongue blades and dried.  Self-retaining retractors were placed.  The mastoid cortex was then removed with cutting burs using continuous suction irrigation with a [employer] S2 drill and a #6 cutting  burr. The mastoid cortex was removed. The mastoid was found to be moderately well pneumatized and all the cells were filled with chronically infected tissue.  Some of the tissue looked like cholesterol granuloma. A complete mastoidectomy was performed.  There was a very well pneumatized retrofascial cell tract inferiorly.  The sigmoid sinus was identified and was followed to the digastric periosteum.  The posterior canal wall was cleaned. There were multiple infected cells along the canal wall, particularly laterally. The sinodural angle was opened and infected cells were cleaned. The tegmen mastoideum was thinned and cleaned.  There was no exposed dura. While thinning of the posterior canal wall, a small dehiscence occurred in the superior mid canal wall.  The antrum was then opened and was filled with chronic reactive tissue.  The attic was then opened, and the ossicles were identified.  Bone lateral to the ossicles was removed. There was a large amount of disease on the ossicles particularly medial to the incus body.  This was a very thick, dens,e firmly adherent fibro- reactive tissue.  The mastoid  cavity was then polished with a #3 diamond bur. The cavity was then irrigated copiously with bacitracin containing saline.  The posterior canal wall skin was then dissected medially toward the anulus. An incision was then made in the posterior canal wall skin with a 5910 Beaver blade from 6-12 o'clock.  A vertical incision was then made to create an atticotomy flap, beginning at 7 o'clock.  The middle ear was then entered posterior superiorly with a curved Turner needle.  The ossicles were completely engulfed in a dense fibroreactive Tissue.  The posterior superior bony canal wall was removed with a #2 diamond bur and a stapes curette. The middle ear was also filled with a dense fibroreactive tissue.  Beginning superiorly and anteriorly, the disease was removed from the  promontory. Disease was then removed from the eustachian tube orifice area, the carotid plate,the hypotympanum disease, the round window niche, and the sinus tympani. Disease was then cleaned from the ossicles.  The chorda tympani nerve was engulfed in the very dense fibroreactive tissue, and I elected to sacrifice the nerve in order to remove the tissue properly.  The sinus tympani was then cleaned further, and disease was cleaned from the long process of the incus medially and between the incus and the facial canal and the obturator foramen.  The facial nerve was covered by bone in horizontal portion.  There was a large amount of disease surrounding the cochleariform process.  This was also removed as well as disease medial to the body of the incus. A superior and inferior approach was used with a sinus tympani excavator and upbiting cup forceps.  At the termination of all the cleaning, the ossicles were intact and mobile.  Bacitracin irrigation fluid flowed freely through the attic and into the middle ear.  The ear was then copiously irrigated with bacitracin containing saline.  A temporalis fascia graft, which had been previously harvested, was then placed medial to the tympanic membrane and the malleus handle.  The anterior portion of graft was brought out through the perforation and folded posteriorly.  The protympanum was then packed with Gelfoam soaked in Ciprodex.  The graft was then rotated anteriorly and tucked 360 degrees around the circumference of the perforation.  The posterior mesotympanum was then packed with Gelfoam discs soaked in Ciprodex, to support the graft medially.  An anterior-superior radial myringotomy incision was made and a Paparella type 1 tube was inserted.  The graft and atticotomy flap were then draped up the posterior bony canal wall into anatomic position. The medial canal was then packed with Gelfoam discs soaked in Ciprodex. The lateral canal was  packed with a quarter-inch iodoform Nu-Gauze packing impregnated with bacitracin ointment.  A sliver Penrose drain was placed to the mastoid cavity.  The musculo- periosteal layer was then closed with interrupted 3-0 chromics. Hemostasis was further obtained, and the site was copiously irrigated with bacitracin containing saline.  The subcutaneous layer was then closed with interrupted inverted 3-0 chromic, and the skin was closed with a running locking 5-0 Ethilon.  Bacitracin ointment was applied to the incision, the ear was padded with Telfa and cotton and a standard adult Glasscock mastoid dressing was applied loosely in the standard fashion.  The Foley catheter was removed without difficulty by the circulating nurse. The patient was awakened, extubated, and transferred to his hospital bed. He appeared to tolerate both the general endotracheal anesthesia and the procedure well and left the operating room in stable condition.  TOTAL FLUIDS:  1700 mL.  TOTAL BLOOD LOSS:  30 mL.  COUNTS:  All sponge, needle, and cotton ball counts were correct at the termination of the procedure.  TOTAL URINE OUTPUT:  600 mL.  The patient received Ancef 2 g IV, Zofran 4 mg IV at the beginning of the procedure. The patient did not receive Decadron due to his Type II diabetes.  Microdissection using the operating room microscope was utilized during the procedure.  [name] will be admitted to the PACU, then will be admitted to an ICU step-down unit preferably 3300.  If stable overnight after being monitored, he will be discharged on May 21, 2013 with his wife. She will be instructed to return him to my office on May 27, 2013 at 2:10 p.m. for followup and suture removal.  DISCHARGE MEDICATIONS:  Include Cefzil 500 mg p.o. b.i.d. x10 days with food, Vicodin 5/300, 1-2 p.o. q.4-6 hours p.r.n. Pain (30 tabs), Ciprodex drops 3 drops, right ear t.i.d. x1 week, and mupirocin ointment  applied to his right postauricular incision b.i.d. x1 week.  He is to resume his home medications with the exception of his anticoagulant that he is to resume  in 2 days.  He is to follow a regular diabetic diet, keep his head elevated on 2 pillows for 3 evenings and call [phone] for any postoperative problems directly related to the procedure.  He will be given both verbal and written instructions.  He is also to avoid water exposure, right ear for the next 3 weeks and avoid flying for three weeks.  SUMMARY:  The patient underwent an uncomplicated right canal tympanomastoidectomy with a type 1 medial fascial graft tympanoplasty and UMT AD with a Paparella type 1 tube.  His middle ear, attic and mastoid were filled with chronically infected fibroreactive tissue. There was no evidence of overt cholesteatoma.  Ossicles were intact and mobile and were cleaned.  The chorda tympani nerve was sacrificed.  A 30% anterior inferior  perforation was grafted using a medial fascial graft technique.  There were no complications.  Sponge, needle, and cotton ball counts were correct at the termination of the procedure.  Right middle ear and mastoid contents were sent to pathology for documentation.  Microdissection using the operating room microscope was utilized during the procedure.   Carolan Shiver, M.D.   EMK/MEDQ  D:  05/20/2013  T:  05/21/2013  Job:  [id number]

## 2013-05-21 NOTE — Progress Notes (Signed)
S: Reports difficult night not feeling well, but improved now, pulse dropped to 51, ok now, voiding with some burning following catheterization in OR, no bleeding reported, no chest pain or cardiac symptoms  O: Awake, alert, VS stable, R postauricular incision dry & without hematoma, drain removed      Packing AD stable      VII th n intact, no nystagmus  A: 1. Stable at this time      2. OK for DC this morning with wife  P: 1. Restart anticoag on 05-23-13      2. DC today with wife      3. Return to office on 12-1-1 4 for suture removal and follow-up      4. Regular ADA diet      5. Restart all home medications, except anticoagulant (begin this 05-23-13)      6. DC meds: Cefzil, vicodin, ciprodex, mupirocin ointment      7. Quiet indoor activity      8. Follow all instructions on yellow instruction sheets given to wife yesterday      9. Call [phone] for any problems or questions directly related to procedure     10. DC status = stable

## 2013-05-22 ENCOUNTER — Encounter (HOSPITAL_COMMUNITY): Payer: Self-pay | Admitting: Otolaryngology

## 2013-10-07 ENCOUNTER — Other Ambulatory Visit: Payer: Self-pay | Admitting: *Deleted

## 2013-10-07 MED ORDER — ISOSORBIDE MONONITRATE ER 30 MG PO TB24: 30.0000 mg | ORAL_TABLET | Freq: Two times a day (BID) | ORAL | Status: DC

## 2014-01-31 ENCOUNTER — Other Ambulatory Visit: Payer: Self-pay | Admitting: *Deleted

## 2014-01-31 MED ORDER — ISOSORBIDE MONONITRATE ER 30 MG PO TB24: 30.0000 mg | ORAL_TABLET | Freq: Two times a day (BID) | ORAL | Status: DC

## 2014-04-25 ENCOUNTER — Encounter (HOSPITAL_COMMUNITY): Payer: Self-pay | Admitting: Emergency Medicine

## 2014-04-25 ENCOUNTER — Emergency Department (HOSPITAL_COMMUNITY): Payer: Medicare Other

## 2014-04-25 ENCOUNTER — Observation Stay (HOSPITAL_COMMUNITY)
Admission: EM | Admit: 2014-04-25 | Discharge: 2014-04-27 | Disposition: A | Discharge status: Home / Self Care | Payer: Medicare Other | Attending: Cardiovascular Disease | Admitting: Cardiovascular Disease

## 2014-04-25 DIAGNOSIS — Z79899 Other long term (current) drug therapy: Secondary | ICD-10-CM | POA: Insufficient documentation

## 2014-04-25 DIAGNOSIS — F0390 Unspecified dementia without behavioral disturbance: Secondary | ICD-10-CM | POA: Insufficient documentation

## 2014-04-25 DIAGNOSIS — I10 Essential (primary) hypertension: Secondary | ICD-10-CM | POA: Insufficient documentation

## 2014-04-25 DIAGNOSIS — Z951 Presence of aortocoronary bypass graft: Secondary | ICD-10-CM | POA: Insufficient documentation

## 2014-04-25 DIAGNOSIS — J449 Chronic obstructive pulmonary disease, unspecified: Secondary | ICD-10-CM | POA: Insufficient documentation

## 2014-04-25 DIAGNOSIS — R079 Chest pain, unspecified: Secondary | ICD-10-CM | POA: Diagnosis not present

## 2014-04-25 DIAGNOSIS — E119 Type 2 diabetes mellitus without complications: Secondary | ICD-10-CM | POA: Insufficient documentation

## 2014-04-25 DIAGNOSIS — R0602 Shortness of breath: Secondary | ICD-10-CM | POA: Diagnosis present

## 2014-04-25 DIAGNOSIS — Z87891 Personal history of nicotine dependence: Secondary | ICD-10-CM | POA: Insufficient documentation

## 2014-04-25 DIAGNOSIS — Z7982 Long term (current) use of aspirin: Secondary | ICD-10-CM | POA: Diagnosis not present

## 2014-04-25 DIAGNOSIS — F419 Anxiety disorder, unspecified: Secondary | ICD-10-CM | POA: Diagnosis not present

## 2014-04-25 DIAGNOSIS — Z7902 Long term (current) use of antithrombotics/antiplatelets: Secondary | ICD-10-CM | POA: Insufficient documentation

## 2014-04-25 DIAGNOSIS — K219 Gastro-esophageal reflux disease without esophagitis: Secondary | ICD-10-CM | POA: Insufficient documentation

## 2014-04-25 DIAGNOSIS — I2510 Atherosclerotic heart disease of native coronary artery without angina pectoris: Secondary | ICD-10-CM | POA: Diagnosis not present

## 2014-04-25 DIAGNOSIS — Z79891 Long term (current) use of opiate analgesic: Secondary | ICD-10-CM | POA: Insufficient documentation

## 2014-04-25 DIAGNOSIS — I200 Unstable angina: Secondary | ICD-10-CM

## 2014-04-25 DIAGNOSIS — E785 Hyperlipidemia, unspecified: Secondary | ICD-10-CM | POA: Insufficient documentation

## 2014-04-25 HISTORY — DX: Atherosclerotic heart disease of native coronary artery without angina pectoris: I25.10

## 2014-04-25 HISTORY — DX: Unspecified dementia, unspecified severity, without behavioral disturbance, psychotic disturbance, mood disturbance, and anxiety: F03.90

## 2014-04-25 LAB — CBC
HCT: 37.3 % — ABNORMAL LOW (ref 39.0–52.0)
HEMOGLOBIN: 12.4 g/dL — AB (ref 13.0–17.0)
MCH: 29.5 pg (ref 26.0–34.0)
MCHC: 33.2 g/dL (ref 30.0–36.0)
MCV: 88.6 fL (ref 78.0–100.0)
Platelets: 278 10*3/uL (ref 150–400)
RBC: 4.21 MIL/uL — ABNORMAL LOW (ref 4.22–5.81)
RDW: 13.3 % (ref 11.5–15.5)
WBC: 9.9 10*3/uL (ref 4.0–10.5)

## 2014-04-25 LAB — BASIC METABOLIC PANEL
Anion gap: 14 (ref 5–15)
BUN: 18 mg/dL (ref 6–23)
CALCIUM: 9.6 mg/dL (ref 8.4–10.5)
CO2: 26 mEq/L (ref 19–32)
Chloride: 97 mEq/L (ref 96–112)
Creatinine, Ser: 1.1 mg/dL (ref 0.50–1.35)
GFR, EST AFRICAN AMERICAN: 82 mL/min — AB (ref >90)
GFR, EST NON AFRICAN AMERICAN: 71 mL/min — AB (ref >90)
Glucose, Bld: 100 mg/dL — ABNORMAL HIGH (ref 70–99)
Potassium: 4.7 mEq/L (ref 3.7–5.3)
Sodium: 137 mEq/L (ref 137–147)

## 2014-04-25 LAB — PRO B NATRIURETIC PEPTIDE: Pro B Natriuretic peptide (BNP): 111.7 pg/mL (ref 0–125)

## 2014-04-25 LAB — I-STAT TROPONIN, ED: Troponin i, poc: 0.01 ng/mL (ref 0.00–0.08)

## 2014-04-25 MED ORDER — LISINOPRIL 2.5 MG PO TABS
2.5000 mg | ORAL_TABLET | Freq: Every day | ORAL | Status: DC
Start: 2014-04-26 — End: 2014-04-27
  Administered 2014-04-26 – 2014-04-27 (×2): 2.5 mg via ORAL
  Filled 2014-04-25 (×2): qty 1

## 2014-04-25 MED ORDER — DOCUSATE SODIUM 100 MG PO CAPS
200.0000 mg | ORAL_CAPSULE | Freq: Every day | ORAL | Status: DC | PRN
Start: 2014-04-25 — End: 2014-04-27
  Filled 2014-04-25: qty 2

## 2014-04-25 MED ORDER — HEPARIN (PORCINE) IN NACL 100-0.45 UNIT/ML-% IJ SOLN
1150.0000 [IU]/h | INTRAMUSCULAR | Status: DC
  Administered 2014-04-25: 950 [IU]/h via INTRAVENOUS
  Filled 2014-04-25 (×2): qty 250

## 2014-04-25 MED ORDER — HEPARIN BOLUS VIA INFUSION
4000.0000 [IU] | Freq: Once | INTRAVENOUS | Status: AC
  Administered 2014-04-25: 4000 [IU] via INTRAVENOUS
  Filled 2014-04-25: qty 4000

## 2014-04-25 MED ORDER — NITROGLYCERIN 0.4 MG SL SUBL
0.4000 mg | SUBLINGUAL_TABLET | SUBLINGUAL | Status: DC | PRN
Start: 2014-04-25 — End: 2014-04-27

## 2014-04-25 MED ORDER — GABAPENTIN 100 MG PO CAPS
100.0000 mg | ORAL_CAPSULE | Freq: Two times a day (BID) | ORAL | Status: DC
  Administered 2014-04-25 – 2014-04-27 (×4): 100 mg via ORAL
  Filled 2014-04-25 (×5): qty 1

## 2014-04-25 MED ORDER — ASPIRIN EC 81 MG PO TBEC
81.0000 mg | DELAYED_RELEASE_TABLET | Freq: Every day | ORAL | Status: DC
  Administered 2014-04-26 – 2014-04-27 (×2): 81 mg via ORAL
  Filled 2014-04-25 (×2): qty 1

## 2014-04-25 MED ORDER — TRAZODONE HCL 50 MG PO TABS
50.0000 mg | ORAL_TABLET | Freq: Every day | ORAL | Status: DC
  Administered 2014-04-25 – 2014-04-26 (×2): 50 mg via ORAL
  Filled 2014-04-25 (×3): qty 1

## 2014-04-25 MED ORDER — DONEPEZIL HCL 5 MG PO TABS
5.0000 mg | ORAL_TABLET | Freq: Every day | ORAL | Status: DC
Start: 2014-04-26 — End: 2014-04-27
  Administered 2014-04-26 – 2014-04-27 (×2): 5 mg via ORAL
  Filled 2014-04-25 (×2): qty 1

## 2014-04-25 MED ORDER — GEMFIBROZIL 600 MG PO TABS
600.0000 mg | ORAL_TABLET | Freq: Two times a day (BID) | ORAL | Status: DC
  Administered 2014-04-26 – 2014-04-27 (×3): 600 mg via ORAL
  Filled 2014-04-25 (×5): qty 1

## 2014-04-25 MED ORDER — HYDROCODONE-ACETAMINOPHEN 5-325 MG PO TABS: 1.0000 | ORAL_TABLET | ORAL | Status: DC | PRN

## 2014-04-25 MED ORDER — CLOPIDOGREL BISULFATE 75 MG PO TABS
75.0000 mg | ORAL_TABLET | Freq: Every day | ORAL | Status: DC
  Administered 2014-04-26 – 2014-04-27 (×2): 75 mg via ORAL
  Filled 2014-04-25 (×2): qty 1

## 2014-04-25 MED ORDER — BUPROPION HCL ER (XL) 300 MG PO TB24
300.0000 mg | ORAL_TABLET | Freq: Every day | ORAL | Status: DC
  Administered 2014-04-26 – 2014-04-27 (×2): 300 mg via ORAL
  Filled 2014-04-25 (×2): qty 1

## 2014-04-25 MED ORDER — NITROGLYCERIN IN D5W 200-5 MCG/ML-% IV SOLN
10.0000 ug/min | INTRAVENOUS | Status: DC
  Administered 2014-04-25: 10 ug/min via INTRAVENOUS
  Filled 2014-04-25: qty 250

## 2014-04-25 MED ORDER — METOPROLOL TARTRATE 25 MG PO TABS
25.0000 mg | ORAL_TABLET | Freq: Two times a day (BID) | ORAL | Status: DC
  Administered 2014-04-25 – 2014-04-27 (×4): 25 mg via ORAL
  Filled 2014-04-25 (×5): qty 1

## 2014-04-25 MED ORDER — INSULIN ASPART 100 UNIT/ML ~~LOC~~ SOLN
0.0000 [IU] | Freq: Three times a day (TID) | SUBCUTANEOUS | Status: DC
  Administered 2014-04-26: 2 [IU] via SUBCUTANEOUS
  Administered 2014-04-26: 3 [IU] via SUBCUTANEOUS
  Administered 2014-04-26: 5 [IU] via SUBCUTANEOUS
  Administered 2014-04-27: 11 [IU] via SUBCUTANEOUS

## 2014-04-25 MED ORDER — NITROGLYCERIN 0.4 MG SL SUBL
0.4000 mg | SUBLINGUAL_TABLET | SUBLINGUAL | Status: AC | PRN
  Administered 2014-04-25 (×3): 0.4 mg via SUBLINGUAL
  Filled 2014-04-25 (×2): qty 1

## 2014-04-25 MED ORDER — PANTOPRAZOLE SODIUM 40 MG PO TBEC
40.0000 mg | DELAYED_RELEASE_TABLET | Freq: Every evening | ORAL | Status: DC
  Administered 2014-04-26: 40 mg via ORAL
  Filled 2014-04-25: qty 1

## 2014-04-25 NOTE — Progress Notes (Signed)
ANTICOAGULATION CONSULT NOTE - Initial Consult  Pharmacy Consult for heparin Indication: ACS  Allergies  Allergen Reactions  . Bee Venom Anaphylaxis  . Codeine Shortness Of Breath and Other (See Comments)    Patient Measurements: Weight: 179 lb (81.194 kg) Heparin Dosing Weight: 81.2 kg  Vital Signs: Temp: 97.9 F (36.6 C) (10/30 1847) BP: 113/63 mmHg (10/30 2000) Pulse Rate: 75 (10/30 1847)  Labs:  Recent Labs  04/25/14 1905  HGB 12.4*  HCT 37.3*  PLT 278  CREATININE 1.10    The CrCl is unknown because both a height and weight (above a minimum accepted value) are required for this calculation.   Medical History: Past Medical History  Diagnosis Date  . Essential hypertension, benign   . Type 2 diabetes mellitus   . COPD (chronic obstructive pulmonary disease)   . Dyslipidemia   . Coronary atherosclerosis of native coronary artery     Multivessel status post CABG, DES to SVG to PLA, DES to RCA  . Bell's palsy   . GERD (gastroesophageal reflux disease)   . Arthritis   . Cardiomyopathy     LVEF 45%  . Myocardial infarction   . Anginal pain     last nitro taken few weeks  . Anxiety   . Depression   . Pneumonia     hx  . Neuromuscular disorder 90    bell's palsy  . Headache(784.0)   . Anemia     hx    Medications:  No PTA anticoagulation  Assessment: 63 y/o M w/ chest pain, SOB, left arm pain, worse when lying down.  POC troponin neg.  Hgb 12.4, platelets 278.  Anticoagulation for ACS.   Goal of Therapy:  Heparin level 0.3 - 0.7 Monitor platelets by anticoagulation protocol: yes  Plan:  Heparin IV 4000u bolus x 1, then heparin IV 950 units/hr F/u CBC, 6 hr heparin level, daily heparin level  Carl Best 04/25/2014,8:13 PM

## 2014-04-25 NOTE — ED Provider Notes (Signed)
CSN: [id number]     Arrival date & time 04/25/14  1840 History   First MD Initiated Contact with Patient 04/25/14 1858     Chief Complaint  Patient presents with  . Shortness of Breath     (Consider location/radiation/quality/duration/timing/severity/associated sxs/prior Treatment) HPI complains of anterior chest pain radiating to left arm and shortness of breath onset possibly one hour ago. Patient reports he was short of breath and lightheaded onset last night. No treatment prior to coming here. He did not have nitroglycerin which he would've taken if he had it in his possession. Pain feels like heart pain he has had in the past. Worse with walking improved with remaining still presently 7 on scale of 1-10 Shortness of breath with worse with lying supine improved with sitting up patient did take his dose of Plavix and aspirin 325 mg earlier today. Past Medical History  Diagnosis Date  . Essential hypertension, benign   . Type 2 diabetes mellitus   . COPD (chronic obstructive pulmonary disease)   . Dyslipidemia   . Coronary atherosclerosis of native coronary artery     Multivessel status post CABG, DES to SVG to PLA, DES to RCA  . Bell's palsy   . GERD (gastroesophageal reflux disease)   . Arthritis   . Cardiomyopathy     LVEF 45%  . Myocardial infarction   . Anginal pain     last nitro taken few weeks  . Anxiety   . Depression   . Pneumonia     hx  . Neuromuscular disorder 90    bell's palsy  . Headache(784.0)   . Anemia     hx   Past Surgical History  Procedure Laterality Date  . Shoulder surgery      Bilateral  . Appendectomy    . Cholecystectomy    . Coronary artery bypass graft  01/01/2011    LIMA to LAD, SVG to PLA, SVG to PDA  . Rotator cuff repair Bilateral   . Knee arthroscopy Left   . Cardiac catheterization      stents 9/14  . Hammer toe surgery Bilateral   . Tonsillectomy      tubes  . Tympanomastoidectomy Right 05/20/2013    Procedure: RIGHT CANAL UP  TYMPANOMASTOIDECTOMY WITH TYPE I TYMPANOPLASTY AND INSERTION OF A TUBE;  Surgeon: Fannie Knee, MD;  Location: [facility] OR;  Service: ENT;  Laterality: Right;   Family History  Problem Relation Age of Onset  . Diabetes    . Hypertension     History  Substance Use Topics  . Smoking status: Former Smoker -- 1.00 packs/day for 50 years    Types: Cigarettes    Quit date: 12/26/2010  . Smokeless tobacco: Never Used  . Alcohol Use: No     Comment: drinks alcohol occasionally    Review of Systems  HENT: Negative.   Respiratory: Positive for shortness of breath.   Cardiovascular: Positive for chest pain.  Gastrointestinal: Negative.   Musculoskeletal: Negative.   Skin: Negative.   Neurological: Positive for weakness.  Psychiatric/Behavioral: Negative.   All other systems reviewed and are negative.     Allergies  Bee venom and Codeine  Home Medications   Prior to Admission medications   Medication Sig Start Date End Date Taking? Authorizing Provider  acetaminophen (TYLENOL) 500 MG tablet Take 1,000 mg by mouth every 6 (six) hours as needed. (for chest wall pain) 03/29/11   Ezra Sites, MD  aspirin 81 MG EC tablet  Take 81 mg by mouth daily.      Historical Provider, MD  buPROPion (WELLBUTRIN XL) 300 MG 24 hr tablet Take 300 mg by mouth daily.    Historical Provider, MD  ciprofloxacin-dexamethasone (CIPRODEX) otic suspension Place 3 drops into both ears 2 (two) times daily. 05/21/13   Fannie Knee, MD  clopidogrel (PLAVIX) 75 MG tablet Take 75 mg by mouth daily.      Historical Provider, MD  docusate sodium (COLACE) 100 MG capsule Take 200 mg by mouth daily as needed.     Historical Provider, MD  gabapentin (NEURONTIN) 100 MG capsule Take 100 mg by mouth at bedtime.    Historical Provider, MD  gemfibrozil (LOPID) 600 MG tablet Take 600 mg by mouth 2 (two) times daily before a meal.      Historical Provider, MD  glimepiride (AMARYL) 4 MG tablet Take 4 mg by mouth daily with breakfast.     Historical Provider, MD  HYDROcodone-acetaminophen (NORCO/VICODIN) 5-325 MG per tablet Take 1-2 tablets by mouth every 4 (four) hours as needed for moderate pain. 05/21/13   Fannie Knee, MD  isosorbide mononitrate (IMDUR) 30 MG 24 hr tablet Take 1 tablet (30 mg total) by mouth 2 (two) times daily. 01/31/14   Satira Sark, MD  metFORMIN (GLUCOPHAGE) 1000 MG tablet Take 1,000 mg by mouth 2 (two) times daily with a meal.    Historical Provider, MD  metoprolol tartrate (LOPRESSOR) 25 MG tablet Take 25 mg by mouth 2 (two) times daily.    Historical Provider, MD  Multiple Vitamin (MULTIVITAMIN) tablet Take 1 tablet by mouth daily.      Historical Provider, MD  mupirocin ointment (BACTROBAN) 2 % Apply 1 application topically 2 (two) times daily. 05/21/13   Fannie Knee, MD  nitroGLYCERIN (NITROSTAT) 0.4 MG SL tablet Place 0.4 mg under the tongue every 5 (five) minutes as needed. For chest pain  03/15/11   Ezra Sites, MD  pantoprazole (PROTONIX) 40 MG tablet Take 40 mg by mouth every evening.     Historical Provider, MD  phenol (CHLORASEPTIC) 1.4 % LIQD Use as directed 1 spray in the mouth or throat as needed for throat irritation / pain. 05/21/13   Fannie Knee, MD  traMADol (ULTRAM) 50 MG tablet Take 50 mg by mouth 4 (four) times daily.    Historical Provider, MD  traZODone (DESYREL) 50 MG tablet Take 50 mg by mouth at bedtime.    Historical Provider, MD   BP 146/78  Pulse 75  Temp(Src) 97.9 F (36.6 C)  Resp 18  Wt 179 lb (81.194 kg)  SpO2 98% Physical Exam  Nursing note and vitals reviewed. Constitutional: He appears well-developed and well-nourished.  HENT:  Head: Normocephalic and atraumatic.  Eyes: Conjunctivae are normal. Pupils are equal, round, and reactive to light.  Neck: Neck supple. No tracheal deviation present. No thyromegaly present.  Cardiovascular: Normal rate and regular rhythm.   No murmur heard. Pulmonary/Chest: Effort normal and breath sounds normal.  Abdominal:  Soft. Bowel sounds are normal. He exhibits no distension. There is no tenderness.  Musculoskeletal: Normal range of motion. He exhibits no edema and no tenderness.  Neurological: He is alert. Coordination normal.  Skin: Skin is warm and dry. No rash noted.  Psychiatric: He has a normal mood and affect.    ED Course  Procedures (including critical care time) Labs Review Labs Reviewed  CBC  BASIC METABOLIC PANEL  PRO B NATRIURETIC PEPTIDE  I-STAT  [place], ED    Imaging Review No results found.   EKG Interpretation None      Date: 04/25/2014  Rate: 70  Rhythm: normal sinus rhythm  QRS Axis: normal  Intervals: normal  ST/T Wave abnormalities: nonspecific T wave changes  Conduction Disutrbances:right bundle branch block  Narrative Interpretation:   Old EKG Reviewed: No significant change from 03/02/2012 interpreted by me 20 10 PM pain improved after nitroglycerin sublingual 3 tablets. Still having pain at 6-7/10. Intravenous nitroglycerin drips and heparin ordered per pharmacy protocol 9 PM pain slowly improving on intravenous nitroglycerin drip. Results for orders placed during the hospital encounter of 04/25/14  CBC      Result Value Ref Range   WBC 9.9  4.0 - 10.5 K/uL   RBC 4.21 (*) 4.22 - 5.81 MIL/uL   Hemoglobin 12.4 (*) 13.0 - 17.0 g/dL   HCT 37.3 (*) 39.0 - 52.0 %   MCV 88.6  78.0 - 100.0 fL   MCH 29.5  26.0 - 34.0 pg   MCHC 33.2  30.0 - 36.0 g/dL   RDW 13.3  11.5 - 15.5 %   Platelets 278  150 - 400 K/uL  BASIC METABOLIC PANEL      Result Value Ref Range   Sodium 137  137 - 147 mEq/L   Potassium 4.7  3.7 - 5.3 mEq/L   Chloride 97  96 - 112 mEq/L   CO2 26  19 - 32 mEq/L   Glucose, Bld 100 (*) 70 - 99 mg/dL   BUN 18  6 - 23 mg/dL   Creatinine, Ser 1.10  0.50 - 1.35 mg/dL   Calcium 9.6  8.4 - 10.5 mg/dL   GFR calc non Af Amer 71 (*) >90 mL/min   GFR calc Af Amer 82 (*) >90 mL/min   Anion gap 14  5 - 15  PRO B NATRIURETIC PEPTIDE      Result Value Ref  Range   Pro B Natriuretic peptide (BNP) 111.7  0 - 125 pg/mL  I-STAT TROPOININ, ED      Result Value Ref Range   Troponin i, poc 0.01  0.00 - 0.08 ng/mL   Comment 3            Dg Chest Portable 1 View  04/25/2014   CLINICAL DATA:  Shortness of breath.  Left chest pain  EXAM: PORTABLE CHEST - 1 VIEW  COMPARISON:  04/14/2011  FINDINGS: The patient is status post median sternotomy and CABG procedure.- there is no pleural effusion or edema. Patchy opacity within the left base is identified which may indicate early pneumonia or atelectasis. Right lung is clear. The visualized osseous structures are unremarkable.  IMPRESSION: 1. Left base opacity which may indicate pneumonia or atelectasis.   Electronically Signed   By: Kerby Moors M.D.   On: 04/25/2014 20:06    MDM  Cardiology called who will evaluate patient for admission. Doubt pneumonia, no cough, sx c/w ACS Final diagnoses:  SOB (shortness of breath)  Dx unstable angina  CRITICAL CARE Performed by: Orlie Dakin Total critical care time: 30 minute Critical care time was exclusive of separately billable procedures and treating other patients. Critical care was necessary to treat or prevent imminent or life-threatening deterioration. Critical care was time spent personally by me on the following activities: development of treatment plan with patient and/or surrogate as well as nursing, discussions with consultants, evaluation of patient's response to treatment, examination of patient, obtaining history from patient or surrogate, ordering and  performing treatments and interventions, ordering and review of laboratory studies, ordering and review of radiographic studies, pulse oximetry and re-evaluation of patient's condition.    Orlie Dakin, MD 04/25/14 2112

## 2014-04-25 NOTE — ED Notes (Signed)
Pt complaining of SOB and left arm pain that started today. sts SOB worse when lying down.

## 2014-04-25 NOTE — H&P (Signed)
History and Physical  Patient ID: [name] MRN: [id number], SOB: [birth year] 64 y.o. Date of Encounter: 04/25/2014, 9:36 PM  Primary Physician: Monico Blitz, MD Primary Cardiologist: Domenic Polite  Chief Complaint: chest pain and SOB  HPI: 63 y.o. male w/ PMHx significant for known CAD s/p CABG and PCI, COPD, HTN, mild dementia who presented to [facility] on 04/25/2014 with complaints of chest pain and sob. Pt's wife is present and she provides some of the history. Pt has been feeling well latel with only occasional chest pain and shortness of breath that apparently he doesn't mention or complain about. However, past day, he has had worsening chest pain that he reports is similar to his prior cardiac pain. Would take a nitro for it but reports not having any available. Pain feels substernal and is located midline and radiates around to his left side. Reports improvement with nitro gtt which was started by the ER. Denies any movement that worsens it though rubbing it may help some. Pt looks to his wife to answer quite a few of the questions.  Denies PND, orthopnea. No fevers, chills. Sleeps poorly at night with snoring. Takes all medication as prescribed, specifically aspirin and clopidogrel.    EKG revealed NSR with RBBB, unchanged from prior. CXR was suggested LL atx vs. Infiltrate. Labs are significant for normal troponin.   Past Medical History  Diagnosis Date  . Essential hypertension, benign   . Type 2 diabetes mellitus   . COPD (chronic obstructive pulmonary disease)   . Dyslipidemia   . Coronary atherosclerosis of native coronary artery     Multivessel status post CABG, DES to SVG to PLA, DES to RCA  . Bell's palsy   . GERD (gastroesophageal reflux disease)   . Arthritis   . Cardiomyopathy     LVEF 45%  . Myocardial infarction   . Anginal pain     last nitro taken few weeks  . Anxiety   . Depression   . Pneumonia     hx  . Neuromuscular disorder 90    bell's palsy   . Headache(784.0)   . Anemia     hx     Surgical History:  Past Surgical History  Procedure Laterality Date  . Shoulder surgery      Bilateral  . Appendectomy    . Cholecystectomy    . Coronary artery bypass graft  01/01/2011    LIMA to LAD, SVG to PLA, SVG to PDA  . Rotator cuff repair Bilateral   . Knee arthroscopy Left   . Cardiac catheterization      stents 9/14  . Hammer toe surgery Bilateral   . Tonsillectomy      tubes  . Tympanomastoidectomy Right 05/20/2013    Procedure: RIGHT CANAL UP TYMPANOMASTOIDECTOMY WITH TYPE I TYMPANOPLASTY AND INSERTION OF A TUBE;  Surgeon: Fannie Knee, MD;  Location: [place];  Service: ENT;  Laterality: Right;     Home Meds: Prior to Admission medications   Medication Sig Start Date End Date Taking? Authorizing Provider  acetaminophen (TYLENOL) 500 MG tablet Take 1,000 mg by mouth every 6 (six) hours as needed. (for chest wall pain) 03/29/11  Yes Ezra Sites, MD  Ascorbic Acid (VITAMIN C PO) Take 1 tablet by mouth daily.   Yes Historical Provider, MD  aspirin 81 MG EC tablet Take 81 mg by mouth daily.     Yes Historical Provider, MD  buPROPion (WELLBUTRIN XL) 300 MG 24 hr tablet Take 300  mg by mouth daily.   Yes Historical Provider, MD  clopidogrel (PLAVIX) 75 MG tablet Take 75 mg by mouth daily.     Yes Historical Provider, MD  docusate sodium (COLACE) 100 MG capsule Take 200 mg by mouth daily as needed.    Yes Historical Provider, MD  donepezil (ARICEPT) 5 MG tablet Take 5 mg by mouth daily.   Yes Historical Provider, MD  gabapentin (NEURONTIN) 100 MG capsule Take 100 mg by mouth 2 (two) times daily.    Yes Historical Provider, MD  gemfibrozil (LOPID) 600 MG tablet Take 600 mg by mouth 2 (two) times daily before a meal.     Yes Historical Provider, MD  glimepiride (AMARYL) 4 MG tablet Take 4 mg by mouth daily with breakfast.   Yes Historical Provider, MD  isosorbide mononitrate (IMDUR) 30 MG 24 hr tablet Take 1 tablet (30 mg total) by  mouth 2 (two) times daily. 01/31/14  Yes Satira Sark, MD  lisinopril (PRINIVIL,ZESTRIL) 2.5 MG tablet Take 2.5 mg by mouth daily.   Yes Historical Provider, MD  metFORMIN (GLUCOPHAGE) 1000 MG tablet Take 1,000 mg by mouth 2 (two) times daily with a meal.   Yes Historical Provider, MD  metoprolol tartrate (LOPRESSOR) 25 MG tablet Take 25 mg by mouth 2 (two) times daily.   Yes Historical Provider, MD  Multiple Vitamin (MULTIVITAMIN) tablet Take 1 tablet by mouth daily.     Yes Historical Provider, MD  Multiple Vitamins-Minerals (ZINC PO) Take 1 tablet by mouth daily.   Yes Historical Provider, MD  nitroGLYCERIN (NITROSTAT) 0.4 MG SL tablet Place 0.4 mg under the tongue every 5 (five) minutes as needed. For chest pain  03/15/11  Yes Ezra Sites, MD  Omega 3 1000 MG CAPS Take 1 capsule by mouth daily.   Yes Historical Provider, MD  pantoprazole (PROTONIX) 40 MG tablet Take 40 mg by mouth every evening.    Yes Historical Provider, MD  traZODone (DESYREL) 50 MG tablet Take 50 mg by mouth at bedtime.   Yes Historical Provider, MD  VITAMIN E PO Take 1 tablet by mouth daily.   Yes Historical Provider, MD  HYDROcodone-acetaminophen (NORCO/VICODIN) 5-325 MG per tablet Take 1-2 tablets by mouth every 4 (four) hours as needed for moderate pain. 05/21/13   Fannie Knee, MD    Allergies:  Allergies  Allergen Reactions  . Bee Venom Anaphylaxis  . Codeine Shortness Of Breath and Other (See Comments)    History   Social History  . Marital Status: Married    Spouse Name: N/A    Number of Children: N/A  . Years of Education: N/A   Occupational History  . Not on file.   Social History Main Topics  . Smoking status: Former Smoker -- 1.00 packs/day for 50 years    Types: Cigarettes    Quit date: 12/26/2010  . Smokeless tobacco: Never Used  . Alcohol Use: No     Comment: drinks alcohol occasionally  . Drug Use: No  . Sexual Activity: Yes   Other Topics Concern  . 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 Diabetes    . Hypertension      Review of Systems: General: negative for chills, fever, night sweats or weight changes.  Cardiovascular: see HPI Dermatological: negative for rash Respiratory: negative for  cough or wheezing Urologic: negative for hematuria Abdominal: negative for nausea, vomiting, diarrhea, bright red blood per rectum, melena, or hematemesis Neurologic: negative for visual changes, syncope, or dizziness All other systems reviewed and are otherwise negative except as noted above.  Labs:   Lab Results  Component Value Date   WBC 9.9 04/25/2014   HGB 12.4* 04/25/2014   HCT 37.3* 04/25/2014   MCV 88.6 04/25/2014   PLT 278 04/25/2014    Recent Labs Lab 04/25/14 1905  NA 137  K 4.7  CL 97  CO2 26  BUN 18  CREATININE 1.10  CALCIUM 9.6  GLUCOSE 100*   i stat: neg  Radiology/Studies:  Dg Chest Portable 1 View  04/25/2014   CLINICAL DATA:  Shortness of breath.  Left chest pain  EXAM: PORTABLE CHEST - 1 VIEW  COMPARISON:  04/14/2011  FINDINGS: The patient is status post median sternotomy and CABG procedure.- there is no pleural effusion or edema. Patchy opacity within the left base is identified which may indicate early pneumonia or atelectasis. Right lung is clear. The visualized osseous structures are unremarkable.  IMPRESSION: 1. Left base opacity which may indicate pneumonia or atelectasis.   Electronically Signed   By: Kerby Moors M.D.   On: 04/25/2014 20:06     EKG: sinus RBBB, unchnaged.  Physical Exam: Blood pressure 117/63, pulse 75, temperature 97.9 F (36.6 C), resp. rate 15, height 5' 8.11" (1.73 m), weight 81.194 kg (179 lb), SpO2 96.00%. General: Well developed, well nourished, in no acute distress. Head: Normocephalic, atraumatic, sclera non-icteric, nares are without discharge Neck: Supple.  Negative for carotid bruits. JVD not elevated. Lungs: decreased air movement throughout, no wheezing Heart: RRR with S1 S2. No murmurs, rubs, or gallops appreciated. Able to reproduce the pain with palpation. Abdomen: Soft, non-tender, non-distended with normoactive bowel sounds. No rebound/guarding. No obvious abdominal masses. Msk:  Strength and tone appear normal for age. Extremities: No edema. No clubbing or cyanosis. Distal pedal pulses are 2+ and equal bilaterally. Neuro: Alert . Moves all extremities spontaneously. Psych:  Responds to questions appropriately with a normal affect. Looks to wife to answer multiple questions    ASSESSMENT AND PLAN:  1. Chest pain: typical and atypical features 2. Known CAD s/p CABG, PCI\] 3. HTN 4. COPD 5. Dementia 6. DM2  63 y.o. male w/ PMHx significant for known CAD s/p CABG and PCI, COPD, HTN, mild dementia who presented to [facility] on 04/25/2014 with complaints of chest pain and sob. His presentation has typical (left sided, similar to prior)  and atypical features (able to reproduce the pain rather clearly with palpation) which confuse his presentation somewhat. His mild dementia also likely contributes to this confusion.  However, the most prudent approach is to admit and rule out for ischemia. Reasonable to continue the ER initiated heparin and nitro though unlikely to get him chest pain free on the nitro considering the MSK component of his pain. Continue aspirin, clopidogrel, BB, lipid therapy, ACEI.   Noted cxray finding though no fever, wbc, or productive cough to suggest pna.  Anticipate weaning of nitro gtt overnight. Will admit to stepdown due to need to titrate off the nitro. If picture still unclear in the AM and biomarkers negative, reasonable to stress.  Continue dementia medication.  Full code but limited trial of support. Heparin gtt   Signed, Shakerra Red C. MD 04/25/2014, 9:36 PM

## 2014-04-25 NOTE — ED Notes (Signed)
EDP at bedside will order a portable x-ray.

## 2014-04-25 NOTE — Progress Notes (Signed)
I agree with the above assessment.   Albertina Parr, PharmD.  Clinical Pharmacist Pager [phone]

## 2014-04-26 ENCOUNTER — Observation Stay (HOSPITAL_COMMUNITY): Payer: Medicare Other

## 2014-04-26 DIAGNOSIS — J449 Chronic obstructive pulmonary disease, unspecified: Secondary | ICD-10-CM | POA: Diagnosis not present

## 2014-04-26 DIAGNOSIS — I10 Essential (primary) hypertension: Secondary | ICD-10-CM | POA: Diagnosis not present

## 2014-04-26 DIAGNOSIS — R079 Chest pain, unspecified: Secondary | ICD-10-CM

## 2014-04-26 DIAGNOSIS — R0602 Shortness of breath: Secondary | ICD-10-CM | POA: Diagnosis not present

## 2014-04-26 LAB — BASIC METABOLIC PANEL
ANION GAP: 14 (ref 5–15)
BUN: 19 mg/dL (ref 6–23)
CO2: 26 mEq/L (ref 19–32)
CREATININE: 0.96 mg/dL (ref 0.50–1.35)
Calcium: 9.1 mg/dL (ref 8.4–10.5)
Chloride: 97 mEq/L (ref 96–112)
GFR calc Af Amer: >90 mL/min (ref >90)
GFR, EST NON AFRICAN AMERICAN: 88 mL/min — AB (ref >90)
Glucose, Bld: 172 mg/dL — ABNORMAL HIGH (ref 70–99)
POTASSIUM: 4.5 meq/L (ref 3.7–5.3)
Sodium: 137 mEq/L (ref 137–147)

## 2014-04-26 LAB — CBC
HEMATOCRIT: 35.5 % — AB (ref 39.0–52.0)
HEMOGLOBIN: 11.8 g/dL — AB (ref 13.0–17.0)
MCH: 29.5 pg (ref 26.0–34.0)
MCHC: 33.2 g/dL (ref 30.0–36.0)
MCV: 88.8 fL (ref 78.0–100.0)
PLATELETS: 265 10*3/uL (ref 150–400)
RBC: 4 MIL/uL — AB (ref 4.22–5.81)
RDW: 13.4 % (ref 11.5–15.5)
WBC: 8.2 10*3/uL (ref 4.0–10.5)

## 2014-04-26 LAB — GLUCOSE, CAPILLARY
GLUCOSE-CAPILLARY: 257 mg/dL — AB (ref 70–99)
Glucose-Capillary: 179 mg/dL — ABNORMAL HIGH (ref 70–99)
Glucose-Capillary: 130 mg/dL — ABNORMAL HIGH (ref 70–99)
Glucose-Capillary: 220 mg/dL — ABNORMAL HIGH (ref 70–99)

## 2014-04-26 LAB — TROPONIN I
Troponin I: <0.3 ng/mL (ref <0.30)
Troponin I: <0.3 ng/mL (ref <0.30)

## 2014-04-26 LAB — MRSA PCR SCREENING: MRSA by PCR: NEGATIVE

## 2014-04-26 LAB — HEPARIN LEVEL (UNFRACTIONATED)
Heparin Unfractionated: 0.16 IU/mL — ABNORMAL LOW (ref 0.30–0.70)
Heparin Unfractionated: <0.1 IU/mL — ABNORMAL LOW (ref 0.30–0.70)

## 2014-04-26 MED ORDER — GUAIFENESIN ER 600 MG PO TB12
600.0000 mg | ORAL_TABLET | Freq: Two times a day (BID) | ORAL | Status: DC
  Administered 2014-04-26 – 2014-04-27 (×3): 600 mg via ORAL
  Filled 2014-04-26 (×4): qty 1

## 2014-04-26 MED ORDER — SODIUM CHLORIDE 0.9 % IJ SOLN
3.0000 mL | Freq: Two times a day (BID) | INTRAMUSCULAR | Status: DC
Start: 2014-04-26 — End: 2014-04-27
  Administered 2014-04-26 – 2014-04-27 (×2): 3 mL via INTRAVENOUS

## 2014-04-26 MED ORDER — SODIUM CHLORIDE 0.9 % IV SOLN: INTRAVENOUS | Status: DC

## 2014-04-26 MED ORDER — ONDANSETRON HCL 4 MG/2ML IJ SOLN
4.0000 mg | Freq: Four times a day (QID) | INTRAMUSCULAR | Status: DC | PRN
  Administered 2014-04-26: 4 mg via INTRAVENOUS
  Filled 2014-04-26: qty 2

## 2014-04-26 NOTE — Progress Notes (Signed)
Patient Name: [name] Date of Encounter: 04/26/2014     Active Problems:   Chest pain    SUBJECTIVE  The patient is having minimal residual chest discomfort.  He is not having any productive cough.  Chest x-ray suggests patchy infiltrate in the left base possibly pneumonia versus atelectasis.  We will get a PA and lateral chest x-ray on him today. The nuclear cardiology schedule was full today.  We'll plan for a [employer] for tomorrow.  His first 2 sets of troponins are normal and his EKG shows no change in his pattern of bifascicular block.  The patient is a poor historian.  He has short-term memory disorder.  He is complaining of mild nausea possibly from being nothing by mouth.  CURRENT MEDS . aspirin EC  81 mg Oral Daily  . buPROPion  300 mg Oral Daily  . clopidogrel  75 mg Oral Daily  . donepezil  5 mg Oral Daily  . gabapentin  100 mg Oral BID  . gemfibrozil  600 mg Oral BID AC  . insulin aspart  0-15 Units Subcutaneous TID WC  . lisinopril  2.5 mg Oral Daily  . metoprolol tartrate  25 mg Oral BID  . pantoprazole  40 mg Oral QPM  . traZODone  50 mg Oral QHS    OBJECTIVE  Filed Vitals:   04/26/14 0600 04/26/14 0700 04/26/14 0800 04/26/14 0900  BP: 117/58 95/46 109/71 106/57  Pulse: 57 59 66 61  Temp:   98 F (36.7 C)   TempSrc:   Oral   Resp: 13 10 13 12   Height:      Weight:      SpO2: 93% 91% 94% 93%    Intake/Output Summary (Last 24 hours) at 04/26/14 1040 Last data filed at 04/26/14 1000  Gross per 24 hour  Intake 286.85 ml  Output   1400 ml  Net -1113.15 ml   Filed Weights   04/25/14 1847 04/25/14 2300  Weight: 179 lb (81.194 kg) 180 lb 8.9 oz (81.9 kg)    PHYSICAL EXAM  General: Pleasant, NAD. Neuro: Alert and oriented X 3. Moves all extremities spontaneously. Psych: Normal affect. HEENT:  Normal  Neck: Supple without bruits or JVD. Lungs:  Resp regular and unlabored, CTA. Heart: RRR no s3, s4, or murmurs. Abdomen: Soft,  non-tender, non-distended, BS + x 4.  Extremities: No clubbing, cyanosis or edema. DP/PT/Radials 2+ and equal bilaterally.  Accessory Clinical Findings  CBC  Recent Labs  04/25/14 1905 04/26/14 0420  WBC 9.9 8.2  HGB 12.4* 11.8*  HCT 37.3* 35.5*  MCV 88.6 88.8  PLT 278 817   Basic Metabolic Panel  Recent Labs  04/25/14 1905 04/26/14 0420  NA 137 137  K 4.7 4.5  CL 97 97  CO2 26 26  GLUCOSE 100* 172*  BUN 18 19  CREATININE 1.10 0.96  CALCIUM 9.6 9.1   Liver Function Tests No results found for this basename: AST, ALT, ALKPHOS, BILITOT, PROT, ALBUMIN,  in the last 72 hours No results found for this basename: LIPASE, AMYLASE,  in the last 72 hours Cardiac Enzymes  Recent Labs  04/25/14 2358 04/26/14 0420  TROPONINI <0.30 <0.30   BNP No components found with this basename: POCBNP,  D-Dimer No results found for this basename: DDIMER,  in the last 72 hours Hemoglobin A1C No results found for this basename: HGBA1C,  in the last 72 hours Fasting Lipid Panel No results found for this basename: CHOL, HDL, LDLCALC,  TRIG, CHOLHDL, LDLDIRECT,  in the last 72 hours Thyroid Function Tests No results found for this basename: TSH, T4TOTAL, FREET3, T3FREE, THYROIDAB,  in the last 72 hours  TELE  Normal sinus rhythm  ECG  I will sinus rhythm.  Left anterior hemiblock.  Right bundle branch block.  No change since prior tracing.  Radiology/Studies  Dg Chest Portable 1 View  04/25/2014   CLINICAL DATA:  Shortness of breath.  Left chest pain  EXAM: PORTABLE CHEST - 1 VIEW  COMPARISON:  04/14/2011  FINDINGS: The patient is status post median sternotomy and CABG procedure.- there is no pleural effusion or edema. Patchy opacity within the left base is identified which may indicate early pneumonia or atelectasis. Right lung is clear. The visualized osseous structures are unremarkable.  IMPRESSION: 1. Left base opacity which may indicate pneumonia or atelectasis.   Electronically  Signed   By: Kerby Moors M.D.   On: 04/25/2014 20:06    ASSESSMENT AND PLAN 1. Chest pain: typical and atypical features  2. Known CAD s/p CABG, PCI\]  3. HTN  4. COPD  5. Dementia  6. DM2  Plan: Lexiscan Myoview on Sunday morning. Chest PA and lateral today.  Trial of Mucinex.  Signed, Darlin Coco MD

## 2014-04-26 NOTE — Progress Notes (Signed)
Complained of feeling nauseous, MD aware and seen pt at once, with order zofran given. Continue to monitor.

## 2014-04-26 NOTE — Progress Notes (Signed)
ANTICOAGULATION CONSULT NOTE - Follow Up Consult  Pharmacy Consult for Heparin  Indication: chest pain/ACS  Allergies  Allergen Reactions  . Bee Venom Anaphylaxis  . Codeine Shortness Of Breath and Other (See Comments)   Patient Measurements: Height: 5\' 8"  (172.7 cm) Weight: 180 lb 8.9 oz (81.9 kg) IBW/kg (Calculated) : 68.4  Vital Signs: Temp: 97.8 F (36.6 C) (10/31 0312) Temp Source: Oral (10/31 0312) BP: 117/59 mmHg (10/31 0430) Pulse Rate: 60 (10/31 0430)  Labs:  Recent Labs  04/25/14 1905 04/25/14 2358 04/26/14 0340 04/26/14 0420  HGB 12.4*  --   --  11.8*  HCT 37.3*  --   --  35.5*  PLT 278  --   --  265  HEPARINUNFRC  --   --  0.16*  --   CREATININE 1.10  --   --   --   TROPONINI  --  <0.30  --   --     Estimated Creatinine Clearance: 68.2 ml/min (by C-G formula based on Cr of 1.1).  Assessment: 63 y/o M on heparin for CP. First HL is 0.16. Other labs as above. No issues per RN.   Goal of Therapy:  Heparin level 0.3-0.7 units/ml Monitor platelets by anticoagulation protocol: Yes   Plan:  -Increase heparin drip to 1150 units/hr -1200 HL -Daily CBC/HL -Monitor for bleeding  Narda Bonds 04/26/2014,5:16 AM

## 2014-04-26 NOTE — Progress Notes (Signed)
UR completed 

## 2014-04-27 ENCOUNTER — Observation Stay (HOSPITAL_COMMUNITY): Payer: Medicare Other

## 2014-04-27 ENCOUNTER — Encounter (HOSPITAL_COMMUNITY): Payer: Self-pay | Admitting: Physician Assistant

## 2014-04-27 DIAGNOSIS — R079 Chest pain, unspecified: Secondary | ICD-10-CM

## 2014-04-27 DIAGNOSIS — F419 Anxiety disorder, unspecified: Secondary | ICD-10-CM | POA: Diagnosis present

## 2014-04-27 DIAGNOSIS — E119 Type 2 diabetes mellitus without complications: Secondary | ICD-10-CM

## 2014-04-27 DIAGNOSIS — I2510 Atherosclerotic heart disease of native coronary artery without angina pectoris: Secondary | ICD-10-CM | POA: Diagnosis present

## 2014-04-27 DIAGNOSIS — K219 Gastro-esophageal reflux disease without esophagitis: Secondary | ICD-10-CM | POA: Diagnosis present

## 2014-04-27 DIAGNOSIS — F0390 Unspecified dementia without behavioral disturbance: Secondary | ICD-10-CM | POA: Diagnosis present

## 2014-04-27 LAB — CBC
HCT: 38.2 % — ABNORMAL LOW (ref 39.0–52.0)
HEMOGLOBIN: 12.5 g/dL — AB (ref 13.0–17.0)
MCH: 29.3 pg (ref 26.0–34.0)
MCHC: 32.7 g/dL (ref 30.0–36.0)
MCV: 89.5 fL (ref 78.0–100.0)
Platelets: 271 10*3/uL (ref 150–400)
RBC: 4.27 MIL/uL (ref 4.22–5.81)
RDW: 13.4 % (ref 11.5–15.5)
WBC: 9 10*3/uL (ref 4.0–10.5)

## 2014-04-27 LAB — GLUCOSE, CAPILLARY: Glucose-Capillary: 302 mg/dL — ABNORMAL HIGH (ref 70–99)

## 2014-04-27 MED ORDER — NITROGLYCERIN 0.4 MG SL SUBL: 0.4000 mg | SUBLINGUAL_TABLET | SUBLINGUAL | Status: DC | PRN

## 2014-04-27 MED ORDER — TECHNETIUM TC 99M SESTAMIBI - CARDIOLITE
30.0000 | Freq: Once | INTRAVENOUS | Status: AC | PRN
  Administered 2014-04-27: 11:00:00 30 via INTRAVENOUS

## 2014-04-27 MED ORDER — TECHNETIUM TC 99M SESTAMIBI GENERIC - CARDIOLITE
10.0000 | Freq: Once | INTRAVENOUS | Status: AC | PRN
  Administered 2014-04-27: 10 via INTRAVENOUS

## 2014-04-27 MED ORDER — REGADENOSON 0.4 MG/5ML IV SOLN
0.4000 mg | Freq: Once | INTRAVENOUS | Status: AC
  Administered 2014-04-27: 0.4 mg via INTRAVENOUS
  Filled 2014-04-27: qty 5

## 2014-04-27 MED ORDER — REGADENOSON 0.4 MG/5ML IV SOLN
INTRAVENOUS | Status: AC
  Administered 2014-04-27: 0.4 mg via INTRAVENOUS
  Filled 2014-04-27: qty 5

## 2014-04-27 NOTE — Discharge Summary (Signed)
Discharge Summary   Patient ID: [name] MRN: [id number], DOB/AGE: May 04, 1951 63 y.o. Admit date: 04/25/2014 D/C date:     04/27/2014  Primary Cardiologist: Dr. Domenic Polite   Principal Problem:   Chest pain Active Problems:   Essential hypertension, benign   COPD (chronic obstructive pulmonary disease)   Dyslipidemia   CAD (coronary artery disease)   Type 2 diabetes mellitus   Dementia   Anxiety   GERD (gastroesophageal reflux disease)    Admission Dates: 04/25/14 Discharge Diagnosis: chest pain s/p low risk nuclear stress test.   HPI: 63 y.o. male w/ PMHx significant for known CAD s/p CABG and PCI, COPD, HTN, mild dementia who presented to [facility] on [facility]/25/2014 with complaints of chest pain and sob  Pt's wife was present and she provides some of the history. Pt has been feeling well lately with only occasional chest pain and shortness of breath that apparently he doesn't mention or complain about. However, past day, he has had worsening chest pain that he reports is similar to his prior cardiac pain. Would take a nitro for it but reports not having any available. Pain feels substernal and is located midline and radiates around to his left side. Reports improvement with nitro gtt which was started by the ER. Denies any movement that worsens it though rubbing it may help some. Pt looks to his wife to answer quite a few of the questions.   Hospital Course  Chest pain- -- Trop neg x2 -- Nuclear stress test today with 1. No evidence of inducible myocardial ischemia. Mild inferior wall attenuation may be a combination of potentially scar and diaphragmatic attenuation. 2. Normal left ventricular wall motion. 3. Left ventricular ejection fraction 58% 4. Low-risk stress test findings. -- Continue ASA/plavix, lopressor 25mg  BID and imdur 30mg  -- Continue to follow with Dr. Domenic Polite. I have re-prescribed SL NTG for patient  HTN- Continue lisinorpil 2.5 mg po qd, lopressor  25mg  BID  HLD- continue Lopid. He is not clear as to whether he has an intolerance to statins or not. He has problems with his memory. Will defer choice of lipid therapy to his internist Dr. Manuella Ghazi in [place].  The patient has had an uncomplicated hospital course and is recovering well. He has been seen by Dr. Mare Ferrari today and deemed ready for discharge home. Discharge medications are listed below. No medication changes were made.    Discharge Vitals: Blood pressure 127/63, pulse 58, temperature 97.5 F (36.4 C), temperature source Oral, resp. rate 15, height 5\' 8"  (1.727 m), weight 180 lb 8.9 oz (81.9 kg), SpO2 100 %.  Labs: Lab Results  Component Value Date   WBC 9.0 04/27/2014   HGB 12.5* 04/27/2014   HCT 38.2* 04/27/2014   MCV 89.5 04/27/2014   PLT 271 04/27/2014     Recent Labs Lab 04/26/14 0420  NA 137  K 4.5  CL 97  CO2 26  BUN 19  CREATININE 0.96  CALCIUM 9.1  GLUCOSE 172*    Recent Labs  04/25/14 2358 04/26/14 0420 04/26/14 1110  TROPONINI <0.30 <0.30 <0.30      Diagnostic Studies/Procedures   Dg Chest 2 View  04/26/2014   CLINICAL DATA:  Chest pain.  EXAM: CHEST  2 VIEW  COMPARISON:  04/25/2014.  FINDINGS: Mediastinum and hilar structures are normal. Heart size normal. Prior CABG. No pleural effusion or pneumothorax.  IMPRESSION: No acute cardiopulmonary disease.   Electronically Signed   By: Marcello Moores  Register   On:  04/26/2014 15:03   Nm Myocar Multi W/spect W/wall Motion / Ef  04/27/2014   CLINICAL DATA:  Chest pain and history of coronary artery disease, cardiomyopathy, prior myocardial infarction, diabetes, hypertension and hyperlipidemia.  EXAM: MYOCARDIAL IMAGING WITH SPECT (REST AND PHARMACOLOGIC-STRESS)  GATED LEFT VENTRICULAR WALL MOTION STUDY  LEFT VENTRICULAR EJECTION FRACTION  TECHNIQUE: Standard myocardial SPECT imaging was performed after resting intravenous injection of 10 mCi Tc-31m sestamibi. Subsequently, intravenous infusion of Lexiscan was  performed under the supervision of the Cardiology staff. At peak effect of the drug, 30 mCi Tc-50m sestamibi was injected intravenously and standard myocardial SPECT imaging was performed. Quantitative gated imaging was also performed to evaluate left ventricular wall motion, and estimate left ventricular ejection fraction.  COMPARISON:  None.  FINDINGS: Perfusion: Mild inferior wall attenuation on both stress and rest acquisitions may represent scar. There also is felt to potentially be a component of diaphragmatic attenuation. There is no evidence to suggest inducible ischemia with Lexiscan administration.  Wall Motion: Normal left ventricular wall motion. No left ventricular dilation.  Left Ventricular Ejection Fraction: 58 %  End diastolic volume 83 ml  End systolic volume 34 ml  IMPRESSION: 1. No evidence of inducible myocardial ischemia. Mild inferior wall attenuation may be a combination of potentially scar and diaphragmatic attenuation.  2. Normal left ventricular wall motion.  3. Left ventricular ejection fraction 58%  4. Low-risk stress test findings*.  *2012 Appropriate Use Criteria for Coronary Revascularization Focused Update: J Am Coll Cardiol. 5397;67(3):419-379. http://content.airportbarriers.com.aspx?articleid=1201161   Electronically Signed   By: Aletta Edouard M.D.   On: 04/27/2014 13:15   Dg Chest Portable 1 View  04/25/2014   CLINICAL DATA:  Shortness of breath.  Left chest pain  EXAM: PORTABLE CHEST - 1 VIEW  COMPARISON:  04/14/2011  FINDINGS: The patient is status post median sternotomy and CABG procedure.- there is no pleural effusion or edema. Patchy opacity within the left base is identified which may indicate early pneumonia or atelectasis. Right lung is clear. The visualized osseous structures are unremarkable.  IMPRESSION: 1. Left base opacity which may indicate pneumonia or atelectasis.   Electronically Signed   By: Kerby Moors M.D.   On: 04/25/2014 20:06    Discharge  Medications     Medication List    TAKE these medications        acetaminophen 500 MG tablet  Commonly known as:  TYLENOL  Take 1,000 mg by mouth every 6 (six) hours as needed. (for chest wall pain)     aspirin 81 MG EC tablet  Take 81 mg by mouth daily.     buPROPion 300 MG 24 hr tablet  Commonly known as:  WELLBUTRIN XL  Take 300 mg by mouth daily.     clopidogrel 75 MG tablet  Commonly known as:  PLAVIX  Take 75 mg by mouth daily.     docusate sodium 100 MG capsule  Commonly known as:  COLACE  Take 200 mg by mouth daily as needed.     donepezil 5 MG tablet  Commonly known as:  ARICEPT  Take 5 mg by mouth daily.     gabapentin 100 MG capsule  Commonly known as:  NEURONTIN  Take 100 mg by mouth 2 (two) times daily.     gemfibrozil 600 MG tablet  Commonly known as:  LOPID  Take 600 mg by mouth 2 (two) times daily before a meal.     glimepiride 4 MG tablet  Commonly known as:  AMARYL  Take 4 mg by mouth daily with breakfast.     HYDROcodone-acetaminophen 5-325 MG per tablet  Commonly known as:  NORCO/VICODIN  Take 1-2 tablets by mouth every 4 (four) hours as needed for moderate pain.     isosorbide mononitrate 30 MG 24 hr tablet  Commonly known as:  IMDUR  Take 1 tablet (30 mg total) by mouth 2 (two) times daily.     lisinopril 2.5 MG tablet  Commonly known as:  PRINIVIL,ZESTRIL  Take 2.5 mg by mouth daily.     metFORMIN 1000 MG tablet  Commonly known as:  GLUCOPHAGE  Take 1,000 mg by mouth 2 (two) times daily with a meal.     metoprolol tartrate 25 MG tablet  Commonly known as:  LOPRESSOR  Take 25 mg by mouth 2 (two) times daily.     multivitamin tablet  Take 1 tablet by mouth daily.     nitroGLYCERIN 0.4 MG SL tablet  Commonly known as:  NITROSTAT  Place 1 tablet (0.4 mg total) under the tongue every 5 (five) minutes as needed. For chest pain     Omega 3 1000 MG Caps  Take 1 capsule by mouth daily.     pantoprazole 40 MG tablet  Commonly known  as:  PROTONIX  Take 40 mg by mouth every evening.     traZODone 50 MG tablet  Commonly known as:  DESYREL  Take 50 mg by mouth at bedtime.     VITAMIN C PO  Take 1 tablet by mouth daily.     VITAMIN E PO  Take 1 tablet by mouth daily.     ZINC PO  Take 1 tablet by mouth daily.        Disposition   The patient will be discharged in stable condition to home.  Follow-up Information    Follow up with Rozann Lesches, MD.   Specialty:  Cardiology   Why:  Please follow up with Dr. Domenic Polite and call to make an appointment    Contact information:   [place] [address] [phone]         Duration of Discharge Encounter: Greater than 30 minutes including physician and PA time.  SignedGrandville Silos, Lyssa Hackley R PA-C 04/27/2014, 2:10 PM

## 2014-04-27 NOTE — Discharge Instructions (Signed)

## 2014-04-27 NOTE — Progress Notes (Signed)
Utilization review completed.  

## 2014-04-27 NOTE — Progress Notes (Addendum)
Patient Name: [name] Date of Encounter: 04/27/2014     Active Problems:   Chest pain    SUBJECTIVE  Seen in nuclear medicine for [facility]. No further Cp. Some SOB   CURRENT MEDS . aspirin EC  81 mg Oral Daily  . buPROPion  300 mg Oral Daily  . clopidogrel  75 mg Oral Daily  . donepezil  5 mg Oral Daily  . gabapentin  100 mg Oral BID  . gemfibrozil  600 mg Oral BID AC  . guaiFENesin  600 mg Oral BID  . insulin aspart  0-15 Units Subcutaneous TID WC  . lisinopril  2.5 mg Oral Daily  . metoprolol tartrate  25 mg Oral BID  . pantoprazole  40 mg Oral QPM  . sodium chloride  3 mL Intravenous Q12H  . traZODone  50 mg Oral QHS    OBJECTIVE  Filed Vitals:   04/27/14 0300 04/27/14 0400 04/27/14 0600 04/27/14 0900  BP: 103/53 119/69 123/73 115/76  Pulse: 58     Temp: 97.8 F (36.6 C)     TempSrc: Oral     Resp: 15 12 15    Height:      Weight:      SpO2: 97%   100%    Intake/Output Summary (Last 24 hours) at 04/27/14 0933 Last data filed at 04/27/14 0617  Gross per 24 hour  Intake  211.5 ml  Output   2025 ml  Net -1813.5 ml   Filed Weights   04/25/14 1847 04/25/14 2300  Weight: 179 lb (81.194 kg) 180 lb 8.9 oz (81.9 kg)    PHYSICAL EXAM  General: Pleasant, NAD. Neuro: Alert and oriented X 3. Moves all extremities spontaneously. Psych: Normal affect. HEENT:  Normal  Neck: Supple without bruits or JVD. Lungs:  Resp regular and unlabored, CTA. Heart: RRR no s3, s4, or murmurs. Abdomen: Soft, non-tender, non-distended, BS + x 4.  Extremities: No clubbing, cyanosis or edema. DP/PT/Radials 2+ and equal bilaterally.  Accessory Clinical Findings  CBC  Recent Labs  04/26/14 0420 04/27/14 0320  WBC 8.2 9.0  HGB 11.8* 12.5*  HCT 35.5* 38.2*  MCV 88.8 89.5  PLT 265 425   Basic Metabolic Panel  Recent Labs  04/25/14 1905 04/26/14 0420  NA 137 137  K 4.7 4.5  CL 97 97  CO2 26 26  GLUCOSE 100* 172*  BUN 18 19  CREATININE 1.10 0.96    CALCIUM 9.6 9.1   Cardiac Enzymes  Recent Labs  04/25/14 2358 04/26/14 0420 04/26/14 1110  TROPONINI <0.30 <0.30 <0.30     TELE  NSR RBBB  Radiology/Studies  Dg Chest 2 View  04/26/2014   CLINICAL DATA:  Chest pain.  EXAM: CHEST  2 VIEW  COMPARISON:  04/25/2014.  FINDINGS: Mediastinum and hilar structures are normal. Heart size normal. Prior CABG. No pleural effusion or pneumothorax.  IMPRESSION: No acute cardiopulmonary disease.   Electronically Signed   By: Marcello Moores  Register   On: 04/26/2014 15:03   Dg Chest Portable 1 View  04/25/2014   CLINICAL DATA:  Shortness of breath.  Left chest pain  EXAM: PORTABLE CHEST - 1 VIEW  COMPARISON:  04/14/2011  FINDINGS: The patient is status post median sternotomy and CABG procedure.- there is no pleural effusion or edema. Patchy opacity within the left base is identified which may indicate early pneumonia or atelectasis. Right lung is clear. The visualized osseous structures are unremarkable.  IMPRESSION: 1. Left base opacity which may indicate  pneumonia or atelectasis.   Electronically Signed   By: Kerby Moors M.D.   On: 04/25/2014 20:06    ASSESSMENT AND PLAN 63 y.o. male w/ PMHx significant for known CAD s/p CABG and PCI, COPD, HTN, mild dementia who presented to [facility] on 04/25/2014 with complaints of chest pain and sob  Chest pain- -- Trop neg x2 -- Await results from nuclear stress test today -- Continue ASA/plavix, lopressor 25mg  BID  HTN- Continue lisinorpil 2.5 mg po qd, lopressor 25mg  BID  HLD- continue Lopid. Not sure why he is not on a statin  Signed, Eileen Stanford PA-C  Pager [phone]  Agree with above assessment.  He states that he tolerated the stress test well.  If no reversible ischemia he should be able to be discharged later today.  He is not clear as to whether he has an intolerance to statins or not.  He has problems with his memory.  I would defer choice of lipid therapy to his internist  Dr. Manuella Ghazi in [place]. His chest x-ray yesterday was unremarkable and showed no evidence of pneumonia.

## 2014-04-28 NOTE — Progress Notes (Signed)
No NCM needs identified. Jonnie Finner RN CCM Case

## 2014-05-19 ENCOUNTER — Other Ambulatory Visit: Payer: Self-pay | Admitting: *Deleted

## 2014-05-19 MED ORDER — ISOSORBIDE MONONITRATE ER 30 MG PO TB24: 30.0000 mg | ORAL_TABLET | Freq: Two times a day (BID) | ORAL | Status: DC

## 2014-06-05 ENCOUNTER — Encounter (HOSPITAL_COMMUNITY): Payer: Self-pay | Admitting: Cardiovascular Disease

## 2014-06-17 ENCOUNTER — Telehealth: Payer: Self-pay | Admitting: *Deleted

## 2014-06-17 ENCOUNTER — Other Ambulatory Visit: Payer: Self-pay | Admitting: Cardiology

## 2014-06-17 NOTE — Telephone Encounter (Signed)
Patient informed that appointment was needed by front staff.

## 2014-06-17 NOTE — Telephone Encounter (Signed)
Left message on vm that a 30 day supply was sent to [facility] due to patient needing an office visit.

## 2014-06-30 ENCOUNTER — Ambulatory Visit: Payer: Medicare Other | Admitting: Cardiology

## 2014-07-22 ENCOUNTER — Ambulatory Visit (INDEPENDENT_AMBULATORY_CARE_PROVIDER_SITE_OTHER): Payer: Medicare Other | Admitting: Cardiology

## 2014-07-22 ENCOUNTER — Encounter: Payer: Self-pay | Admitting: Cardiology

## 2014-07-22 VITALS — BP 105/68 | HR 61 | Ht 68.0 in | Wt 187.1 lb

## 2014-07-22 DIAGNOSIS — I10 Essential (primary) hypertension: Secondary | ICD-10-CM

## 2014-07-22 DIAGNOSIS — E785 Hyperlipidemia, unspecified: Secondary | ICD-10-CM

## 2014-07-22 DIAGNOSIS — I2510 Atherosclerotic heart disease of native coronary artery without angina pectoris: Secondary | ICD-10-CM

## 2014-07-22 NOTE — Assessment & Plan Note (Signed)
Blood pressure is normal today. 

## 2014-07-22 NOTE — Patient Instructions (Signed)
Your physician recommends that you schedule a follow-up appointment in: 1 year. You will receive a reminder letter in the mail in about 10 months reminding you to call and schedule your appointment. If you don't receive this letter, please contact our office. Your physician has recommended you make the following change in your medication:  Stop plavix. Stop isosorbide mononitrate (imdur). Continue all other medications the same.

## 2014-07-22 NOTE — Assessment & Plan Note (Signed)
Symptomatically stable at this time with low risk Myoview noted in November 2015. We reviewed his medications and will try to simplify to aspirin, beta blocker, and ACE inhibitor. Plavix will be discontinued for now, and he will try off of Imdur at least temporarily to see if he is able to stop this medicine without exacerbating angina. They prefer to keep a one-year follow-up, although encouraged to come back sooner if his symptoms worsen or change significantly. He will otherwise continue see Dr. Manuella Ghazi.

## 2014-07-22 NOTE — Progress Notes (Signed)
Reason for visit: CAD  Clinical Summary [name] is a 64 y.o.male last seen in November 2014. Interval records reviewed including hospitalization at [facility] in November 2015. He presented with chest pain at that time and ruled out for myocardial infarction. He underwent a Myoview at that time that showed no clear evidence of ischemia with either scar or attenuation artifact noted in the inferior wall, LVEF 58%, overall low risk. Medical therapy was continued.  He presents with his wife today for a follow-up visit. They indicate significant financial constraints, patient's wife lost her job. They have been having difficulty affording his present medications and asked try to simplify things as much as possible. He does not endorse any clear-cut angina, only occasional brief, very focal, sharp chest pains that are atypical.  Today we reviewed his medical regimen. He is far enough out from coronary intervention to consider stopping his Plavix. Would prefer to try and keep him on aspirin, beta blocker, and lisinopril, although Imdur would be another potential deletion, if he does not have worsening angina off of the medication. He will also being seeing Dr. Manuella Ghazi soon and can review his other medications.  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 He has been managed medically.  Allergies  Allergen Reactions  . Bee Venom Anaphylaxis  . Codeine Shortness Of Breath and Other (See Comments)    Current Outpatient Prescriptions  Medication  Sig Dispense Refill  . acetaminophen (TYLENOL) 500 MG tablet Take 1,000 mg by mouth every 6 (six) hours as needed. (for chest wall pain)    . Ascorbic Acid (VITAMIN C PO) Take 1 tablet by mouth daily.    Marland Kitchen aspirin 81 MG EC tablet Take 81 mg by mouth daily.      Marland Kitchen buPROPion (WELLBUTRIN XL) 300 MG 24 hr tablet Take 300 mg by mouth daily.    Marland Kitchen donepezil (ARICEPT) 5 MG tablet Take 5 mg by mouth daily.    Marland Kitchen gabapentin (NEURONTIN) 100 MG capsule Take 100 mg by mouth 2 (two) times daily.     Marland Kitchen gemfibrozil (LOPID) 600 MG tablet Take 600 mg by mouth 2 (two) times daily before a meal.      . glimepiride (AMARYL) 4 MG tablet Take 4 mg by mouth daily with breakfast.    . lisinopril (PRINIVIL,ZESTRIL) 2.5 MG tablet Take 2.5 mg by mouth daily.    . metFORMIN (GLUCOPHAGE) 1000 MG tablet Take 1,000 mg by mouth 2 (two) times daily with a meal.    . metoprolol tartrate (LOPRESSOR) 25 MG tablet Take 25 mg by mouth 2 (two) times daily.    . nitroGLYCERIN (NITROSTAT) 0.4 MG SL tablet Place 1 tablet (0.4 mg total) under the tongue every 5 (five) minutes as needed. For chest pain 25 tablet 12  . pantoprazole (PROTONIX) 40 MG tablet Take 40 mg by mouth every evening.     . traZODone (DESYREL) 50 MG tablet Take 50 mg by mouth at bedtime.    . Zinc Sulfate (ZINC 15 PO) Take 1 tablet by mouth daily.    Marland Kitchen  Omega 3 1000 MG CAPS Take 1 capsule by mouth daily.     No current facility-administered medications for this visit.    Past Medical History  Diagnosis Date  . Essential hypertension, benign   . Type 2 diabetes mellitus   . COPD (chronic obstructive pulmonary disease)   . Dyslipidemia   . CAD (coronary artery disease)     a. s/p CABG, DES to SVG to PLA, DES to RCA  b. low risk nuclear stress test 04/2014  . GERD (gastroesophageal reflux disease)   . Arthritis   . Anxiety   . Depression   . History of Bell's palsy   . Dementia     Past Surgical History  Procedure Laterality Date  . Shoulder surgery       Bilateral  . Appendectomy    . Cholecystectomy    . Coronary artery bypass graft  01/01/2011    LIMA to LAD, SVG to PLA, SVG to PDA  . Rotator cuff repair Bilateral   . Knee arthroscopy Left   . Hammer toe surgery Bilateral   . Tonsillectomy      tubes  . Tympanomastoidectomy Right 05/20/2013    Procedure: RIGHT CANAL UP TYMPANOMASTOIDECTOMY WITH TYPE I TYMPANOPLASTY AND INSERTION OF A TUBE;  Surgeon: Fannie Knee, MD;  Location: [place];  Service: ENT;  Laterality: Right;  . Left heart catheterization with coronary/graft angiogram N/A 03/01/2012    Procedure: LEFT HEART CATHETERIZATION WITH Beatrix Fetters;  Surgeon: Sherren Mocha, MD;  Location: [facility] CATH LAB;  Service: Cardiovascular;  Laterality: N/A;    Social History [name] reports that he quit smoking about 3 years ago. His smoking use included Cigarettes. He has a 50 pack-year smoking history. He has never used smokeless tobacco. [name] reports that he does not drink alcohol.  Review of Systems Complete review of systems negative except as otherwise outlined in the clinical summary and also the following. No palpitations or dizziness. Reports stable appetite. No unusual bleeding problems.  Physical Examination Filed Vitals:   07/22/14 1613  BP: 105/68  Pulse: 61    Wt Readings from Last 3 Encounters:  07/22/14 187 lb 1.9 oz (84.877 kg)  04/25/14 180 lb 8.9 oz (81.9 kg)  05/20/13 186 lb (84.369 kg)   No acute distress.  HEENT: Conjunctiva and lids normal, oropharynx clear.  Neck: Supple, no elevated JVP or carotid bruits, no thyromegaly.  Lungs: Clear to auscultation, nonlabored breathing at rest.  Cardiac: Regular rate and rhythm, no S3 or significant systolic murmur.  Abdomen: Soft, nontender,bowel sounds present.  Extremities: No pitting edema, distal pulses 2+.    Problem List and Plan   CAD (coronary artery disease) Symptomatically stable at this time with low risk Myoview noted in  November 2015. We reviewed his medications and will try to simplify to aspirin, beta blocker, and ACE inhibitor. Plavix will be discontinued for now, and he will try off of Imdur at least temporarily to see if he is able to stop this medicine without exacerbating angina. They prefer to keep a one-year follow-up, although encouraged to come back sooner if his symptoms worsen or change significantly. He will otherwise continue see Dr. Manuella Ghazi.   Essential hypertension, benign Blood pressure is normal today.   Dyslipidemia He continues on Lopid, omega-3 supplements, follow-up pending with Dr. Manuella Ghazi for repeat lab work.     Satira Sark, M.D., F.A.C.C.

## 2014-07-22 NOTE — Assessment & Plan Note (Signed)
He continues on Lopid, omega-3 supplements, follow-up pending with Dr. Manuella Ghazi for repeat lab work.

## 2014-09-28 ENCOUNTER — Encounter (HOSPITAL_COMMUNITY): Payer: Self-pay | Admitting: Emergency Medicine

## 2014-09-28 ENCOUNTER — Emergency Department (HOSPITAL_COMMUNITY)
Admission: EM | Admit: 2014-09-28 | Discharge: 2014-09-30 | Disposition: A | Discharge status: Other Acute Inpatient Hospital | Payer: Medicare Other | Attending: Emergency Medicine | Admitting: Emergency Medicine

## 2014-09-28 DIAGNOSIS — J449 Chronic obstructive pulmonary disease, unspecified: Secondary | ICD-10-CM | POA: Diagnosis not present

## 2014-09-28 DIAGNOSIS — F332 Major depressive disorder, recurrent severe without psychotic features: Secondary | ICD-10-CM | POA: Diagnosis not present

## 2014-09-28 DIAGNOSIS — R45851 Suicidal ideations: Secondary | ICD-10-CM

## 2014-09-28 DIAGNOSIS — Z8669 Personal history of other diseases of the nervous system and sense organs: Secondary | ICD-10-CM | POA: Diagnosis not present

## 2014-09-28 DIAGNOSIS — K219 Gastro-esophageal reflux disease without esophagitis: Secondary | ICD-10-CM | POA: Insufficient documentation

## 2014-09-28 DIAGNOSIS — Z7982 Long term (current) use of aspirin: Secondary | ICD-10-CM | POA: Insufficient documentation

## 2014-09-28 DIAGNOSIS — F0390 Unspecified dementia without behavioral disturbance: Secondary | ICD-10-CM | POA: Insufficient documentation

## 2014-09-28 DIAGNOSIS — Z79899 Other long term (current) drug therapy: Secondary | ICD-10-CM | POA: Insufficient documentation

## 2014-09-28 DIAGNOSIS — R456 Violent behavior: Secondary | ICD-10-CM

## 2014-09-28 DIAGNOSIS — Z9889 Other specified postprocedural states: Secondary | ICD-10-CM | POA: Diagnosis not present

## 2014-09-28 DIAGNOSIS — M1990 Unspecified osteoarthritis, unspecified site: Secondary | ICD-10-CM | POA: Diagnosis not present

## 2014-09-28 DIAGNOSIS — Z87891 Personal history of nicotine dependence: Secondary | ICD-10-CM | POA: Insufficient documentation

## 2014-09-28 DIAGNOSIS — F322 Major depressive disorder, single episode, severe without psychotic features: Secondary | ICD-10-CM | POA: Diagnosis not present

## 2014-09-28 DIAGNOSIS — E119 Type 2 diabetes mellitus without complications: Secondary | ICD-10-CM | POA: Diagnosis not present

## 2014-09-28 DIAGNOSIS — R45850 Homicidal ideations: Secondary | ICD-10-CM

## 2014-09-28 DIAGNOSIS — I2510 Atherosclerotic heart disease of native coronary artery without angina pectoris: Secondary | ICD-10-CM | POA: Diagnosis not present

## 2014-09-28 DIAGNOSIS — F911 Conduct disorder, childhood-onset type: Secondary | ICD-10-CM | POA: Diagnosis not present

## 2014-09-28 DIAGNOSIS — Z008 Encounter for other general examination: Secondary | ICD-10-CM | POA: Diagnosis present

## 2014-09-28 DIAGNOSIS — I10 Essential (primary) hypertension: Secondary | ICD-10-CM | POA: Diagnosis not present

## 2014-09-28 DIAGNOSIS — Z951 Presence of aortocoronary bypass graft: Secondary | ICD-10-CM | POA: Insufficient documentation

## 2014-09-28 DIAGNOSIS — F419 Anxiety disorder, unspecified: Secondary | ICD-10-CM

## 2014-09-28 DIAGNOSIS — R4689 Other symptoms and signs involving appearance and behavior: Secondary | ICD-10-CM

## 2014-09-28 LAB — CBC WITH DIFFERENTIAL/PLATELET
BASOS ABS: 0 10*3/uL (ref 0.0–0.1)
Basophils Relative: 0 % (ref 0–1)
EOS ABS: 0.2 10*3/uL (ref 0.0–0.7)
Eosinophils Relative: 2 % (ref 0–5)
HCT: 39.2 % (ref 39.0–52.0)
HEMOGLOBIN: 13 g/dL (ref 13.0–17.0)
LYMPHS PCT: 22 % (ref 12–46)
Lymphs Abs: 2.2 10*3/uL (ref 0.7–4.0)
MCH: 29.8 pg (ref 26.0–34.0)
MCHC: 33.2 g/dL (ref 30.0–36.0)
MCV: 89.9 fL (ref 78.0–100.0)
Monocytes Absolute: 0.7 10*3/uL (ref 0.1–1.0)
Monocytes Relative: 7 % (ref 3–12)
NEUTROS PCT: 69 % (ref 43–77)
Neutro Abs: 6.8 10*3/uL (ref 1.7–7.7)
PLATELETS: 311 10*3/uL (ref 150–400)
RBC: 4.36 MIL/uL (ref 4.22–5.81)
RDW: 13.8 % (ref 11.5–15.5)
WBC: 9.8 10*3/uL (ref 4.0–10.5)

## 2014-09-28 LAB — RAPID URINE DRUG SCREEN, HOSP PERFORMED
AMPHETAMINES: NOT DETECTED
BENZODIAZEPINES: NOT DETECTED
Barbiturates: NOT DETECTED
Cocaine: NOT DETECTED
Opiates: NOT DETECTED
Tetrahydrocannabinol: NOT DETECTED

## 2014-09-28 LAB — COMPREHENSIVE METABOLIC PANEL
ALBUMIN: 4.2 g/dL (ref 3.5–5.2)
ALK PHOS: 95 U/L (ref 39–117)
ALT: 30 U/L (ref 0–53)
AST: 34 U/L (ref 0–37)
Anion gap: 11 (ref 5–15)
BILIRUBIN TOTAL: 0.5 mg/dL (ref 0.3–1.2)
BUN: 20 mg/dL (ref 6–23)
CHLORIDE: 101 mmol/L (ref 96–112)
CO2: 23 mmol/L (ref 19–32)
Calcium: 9 mg/dL (ref 8.4–10.5)
Creatinine, Ser: 1.18 mg/dL (ref 0.50–1.35)
GFR, EST AFRICAN AMERICAN: 75 mL/min — AB (ref >90)
GFR, EST NON AFRICAN AMERICAN: 65 mL/min — AB (ref >90)
Glucose, Bld: 217 mg/dL — ABNORMAL HIGH (ref 70–99)
Potassium: 4.6 mmol/L (ref 3.5–5.1)
Sodium: 135 mmol/L (ref 135–145)
TOTAL PROTEIN: 7.5 g/dL (ref 6.0–8.3)

## 2014-09-28 MED ORDER — VITAMIN C 500 MG PO TABS
500.0000 mg | ORAL_TABLET | Freq: Every day | ORAL | Status: DC
  Administered 2014-09-29 – 2014-09-30 (×2): 500 mg via ORAL
  Filled 2014-09-28 (×2): qty 1

## 2014-09-28 MED ORDER — ONDANSETRON HCL 4 MG PO TABS: 4.0000 mg | ORAL_TABLET | Freq: Three times a day (TID) | ORAL | Status: DC | PRN

## 2014-09-28 MED ORDER — PANTOPRAZOLE SODIUM 40 MG PO TBEC
40.0000 mg | DELAYED_RELEASE_TABLET | Freq: Every evening | ORAL | Status: DC
  Administered 2014-09-28 – 2014-09-29 (×2): 40 mg via ORAL
  Filled 2014-09-28 (×2): qty 1

## 2014-09-28 MED ORDER — GLIMEPIRIDE 4 MG PO TABS
4.0000 mg | ORAL_TABLET | Freq: Every day | ORAL | Status: DC
  Administered 2014-09-29 – 2014-09-30 (×2): 4 mg via ORAL
  Filled 2014-09-28 (×3): qty 1

## 2014-09-28 MED ORDER — LORAZEPAM 1 MG PO TABS: 1.0000 mg | ORAL_TABLET | Freq: Three times a day (TID) | ORAL | Status: DC | PRN

## 2014-09-28 MED ORDER — BUPROPION HCL ER (SR) 150 MG PO TB12
150.0000 mg | ORAL_TABLET | Freq: Two times a day (BID) | ORAL | Status: DC
  Administered 2014-09-28 – 2014-09-30 (×4): 150 mg via ORAL
  Filled 2014-09-28 (×6): qty 1

## 2014-09-28 MED ORDER — GABAPENTIN 100 MG PO CAPS
100.0000 mg | ORAL_CAPSULE | Freq: Two times a day (BID) | ORAL | Status: DC
  Administered 2014-09-28 – 2014-09-30 (×4): 100 mg via ORAL
  Filled 2014-09-28 (×4): qty 1

## 2014-09-28 MED ORDER — NICOTINE 21 MG/24HR TD PT24
21.0000 mg | MEDICATED_PATCH | Freq: Every day | TRANSDERMAL | Status: DC
  Filled 2014-09-28: qty 1

## 2014-09-28 MED ORDER — ALUM & MAG HYDROXIDE-SIMETH 200-200-20 MG/5ML PO SUSP: 30.0000 mL | ORAL | Status: DC | PRN

## 2014-09-28 MED ORDER — DONEPEZIL HCL 5 MG PO TABS: 5.0000 mg | ORAL_TABLET | Freq: Every day | ORAL | Status: DC

## 2014-09-28 MED ORDER — ACETAMINOPHEN 325 MG PO TABS
650.0000 mg | ORAL_TABLET | ORAL | Status: DC | PRN
  Administered 2014-09-29 – 2014-09-30 (×2): 650 mg via ORAL
  Filled 2014-09-28 (×2): qty 2

## 2014-09-28 MED ORDER — GEMFIBROZIL 600 MG PO TABS
600.0000 mg | ORAL_TABLET | Freq: Two times a day (BID) | ORAL | Status: DC
Start: 2014-09-28 — End: 2014-09-28

## 2014-09-28 MED ORDER — NITROGLYCERIN 0.4 MG SL SUBL: 0.4000 mg | SUBLINGUAL_TABLET | SUBLINGUAL | Status: DC | PRN

## 2014-09-28 MED ORDER — ZOLPIDEM TARTRATE 5 MG PO TABS
5.0000 mg | ORAL_TABLET | Freq: Every evening | ORAL | Status: DC | PRN
Start: 2014-09-28 — End: 2014-09-30
  Administered 2014-09-28 – 2014-09-30 (×2): 5 mg via ORAL
  Filled 2014-09-28 (×2): qty 1

## 2014-09-28 MED ORDER — BUPROPION HCL ER (XL) 300 MG PO TB24: 300.0000 mg | ORAL_TABLET | Freq: Every day | ORAL | Status: DC

## 2014-09-28 MED ORDER — ASPIRIN EC 81 MG PO TBEC
81.0000 mg | DELAYED_RELEASE_TABLET | Freq: Every day | ORAL | Status: DC
Start: 2014-09-29 — End: 2014-09-30
  Administered 2014-09-29 – 2014-09-30 (×2): 81 mg via ORAL
  Filled 2014-09-28 (×2): qty 1

## 2014-09-28 MED ORDER — PRAVASTATIN SODIUM 20 MG PO TABS
20.0000 mg | ORAL_TABLET | Freq: Every day | ORAL | Status: DC
  Administered 2014-09-29 – 2014-09-30 (×2): 20 mg via ORAL
  Filled 2014-09-28 (×2): qty 1

## 2014-09-28 MED ORDER — METFORMIN HCL 500 MG PO TABS
1000.0000 mg | ORAL_TABLET | Freq: Two times a day (BID) | ORAL | Status: DC
  Administered 2014-09-28 – 2014-09-30 (×4): 1000 mg via ORAL
  Filled 2014-09-28 (×6): qty 2

## 2014-09-28 MED ORDER — METOPROLOL TARTRATE 25 MG PO TABS
25.0000 mg | ORAL_TABLET | Freq: Two times a day (BID) | ORAL | Status: DC
  Administered 2014-09-28 – 2014-09-30 (×4): 25 mg via ORAL
  Filled 2014-09-28 (×4): qty 1

## 2014-09-28 MED ORDER — LISINOPRIL 2.5 MG PO TABS
2.5000 mg | ORAL_TABLET | Freq: Every day | ORAL | Status: DC
  Administered 2014-09-29 – 2014-09-30 (×2): 2.5 mg via ORAL
  Filled 2014-09-28 (×2): qty 1

## 2014-09-28 MED ORDER — IBUPROFEN 200 MG PO TABS
600.0000 mg | ORAL_TABLET | Freq: Three times a day (TID) | ORAL | Status: DC | PRN
  Administered 2014-09-29: 600 mg via ORAL
  Filled 2014-09-28: qty 3

## 2014-09-28 MED ORDER — TRAZODONE HCL 50 MG PO TABS
50.0000 mg | ORAL_TABLET | Freq: Every day | ORAL | Status: DC
Start: 2014-09-28 — End: 2014-09-28

## 2014-09-28 NOTE — ED Notes (Signed)
Patient has shoes, shirt, boxers, shorts, and hat. Items are in 1 belonging bag, Items have been wanded.  Wife took valuables home with her.

## 2014-09-28 NOTE — ED Notes (Signed)
Pt received in room 31. Alert and oriented. Ambulated to TCU from ED. Steady. Voiced no concerns at this time. Vwilliams,rn.
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
## 2014-09-28 NOTE — ED Notes (Signed)
Patient has been wanded by security.  
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
## 2014-09-29 DIAGNOSIS — R4689 Other symptoms and signs involving appearance and behavior: Secondary | ICD-10-CM | POA: Insufficient documentation

## 2014-09-29 DIAGNOSIS — R45851 Suicidal ideations: Secondary | ICD-10-CM | POA: Diagnosis not present

## 2014-09-29 DIAGNOSIS — R45850 Homicidal ideations: Secondary | ICD-10-CM | POA: Insufficient documentation

## 2014-09-29 DIAGNOSIS — F332 Major depressive disorder, recurrent severe without psychotic features: Secondary | ICD-10-CM | POA: Diagnosis present

## 2014-09-29 DIAGNOSIS — F322 Major depressive disorder, single episode, severe without psychotic features: Secondary | ICD-10-CM

## 2014-09-29 LAB — CBG MONITORING, ED
Glucose-Capillary: 182 mg/dL — ABNORMAL HIGH (ref 70–99)
Glucose-Capillary: 225 mg/dL — ABNORMAL HIGH (ref 70–99)

## 2014-09-29 NOTE — ED Notes (Addendum)
Pt states he felt like sugar was getting low. Blood sugar was level of 182.

## 2014-09-29 NOTE — ED Notes (Signed)
New transfer from [place]. Calm and appropriate. No acute distress noted. Q15 min. safety checks initiated upon arrival. Will continue current POC. Acuity low

## 2014-09-29 NOTE — Progress Notes (Signed)
Olivia Mackie of [facility] reached out to [place] requesting labs.  CSW faxed the labs to [facility] [facility].  Willette Brace [phone] ED CSW 09/29/2014 6:36 PM

## 2014-09-29 NOTE — Consult Note (Signed)
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 Major depressive disorder, single episode, severe [F32.2] 09/29/2014    Priority: High  . CAD (coronary artery disease) [I25.10]   . Type 2 diabetes mellitus [E11.9]   . Dementia [F03.90]   . Anxiety [F41.9]   . GERD (gastroesophageal reflux disease) [K21.9]   . Preoperative cardiovascular examination [Z01.810] 05/01/2013  . Memory loss [R41.3] 10/04/2012  . Status post coronary artery bypass grafting [Z95.1] 01/27/2011  . Essential hypertension, benign [I10] 01/27/2011  . COPD (chronic obstructive pulmonary disease) [J44.9] 01/27/2011  . Dyslipidemia [E78.5] 01/27/2011    Total Time spent with patient: 1 hour  Subjective:   [name] is a 64 y.o. male patient admitted with Major depression, anxiety, suicidal thought.Marland Kitchen  HPI:  Caucasian male, 64 years old was evaluated for increased feelings of depression and anxiety.  He reported that he has been getting more aggitted towards his adult son who moved back to the house.  Patient stated " I would rather be out than live this way"  When asked to explain himself he stated he prefers to be dead than live the life of depression and anger.  Patient had an argument with his son and he went into his car and started chasing after him.    Patient stated he wanted to shoot himself twice in 2008 and was admitted to [facility].  He reports his stressors are his son moving back to the house and not adhering to the rules in the house and his wife losing her job and not bringing any income.  He has had some deaths in the family.  Patient could not contract for safety and reported 4 hours of sleep at night.  Patient reports feeling hopeless  and helpless.  Patient denies HI/AVH.  He has been accepted for admission and we will be looking for bed at any hospital with available inpatient Psychiatric beds.  HPI Elements:   Location:  Major depressive disorder, single episode. Quality:  severe. Severity:  severe. Timing:  Acute. Duration:  Two months. Context:  Seeking treatment for suicidal thoughts and depression..  Past Medical History:  Past Medical History  Diagnosis Date  . Essential hypertension, benign   . Type 2 diabetes mellitus   . COPD (chronic obstructive pulmonary disease)   . Dyslipidemia   . CAD (coronary artery disease)     a. s/p CABG, DES to SVG to PLA, DES to RCA  b. low risk nuclear stress test 04/2014  . GERD (gastroesophageal reflux disease)   . Arthritis   . Anxiety   . Depression   . History of Bell's palsy   . Dementia     Past Surgical History  Procedure Laterality Date  . Shoulder surgery      Bilateral  . Appendectomy    . Cholecystectomy    . Coronary artery bypass graft  01/01/2011    LIMA to LAD, SVG to PLA, SVG to PDA  . Rotator cuff repair Bilateral   . Knee arthroscopy Left   . Hammer toe surgery Bilateral   . Tonsillectomy      tubes  . Tympanomastoidectomy Right 05/20/2013    Procedure: RIGHT CANAL UP TYMPANOMASTOIDECTOMY WITH TYPE I TYMPANOPLASTY AND INSERTION OF A TUBE;  Surgeon: Leighton Ruff  Thornell Mule, MD;  Location: [facility];  Service: ENT;  Laterality: Right;  . Left heart catheterization with coronary/graft angiogram N/A 03/01/2012    Procedure: LEFT HEART CATHETERIZATION WITH Beatrix Fetters;  Surgeon: Sherren Mocha, MD;  Location: [facility] CATH LAB;  Service: Cardiovascular;  Laterality: N/A;   Family History:  Family History  Problem Relation Age of Onset  . Diabetes    . Hypertension     Social History:  History  Alcohol Use No    Comment: drinks alcohol occasionally     History  Drug Use No    History   Social History  . Marital Status: Married    Spouse Name:  N/A  . Number of Children: N/A  . Years of Education: N/A   Social History Main Topics  . Smoking status: Former Smoker -- 1.00 packs/day for 50 years    Types: Cigarettes    Quit date: 12/26/2010  . Smokeless tobacco: Never Used  . Alcohol Use: No     Comment: drinks alcohol occasionally  . Drug Use: No  . Sexual Activity: Yes   Other Topics Concern  . 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 Bee Venom Anaphylaxis  . Codeine Shortness Of Breath and Other (See Comments)    Labs:  Results for orders placed or performed during the hospital encounter of 09/28/14 (from the past 48 hour(s))  Drug screen panel, emergency     Status: None   Collection Time: 09/28/14  3:29 PM  Result Value Ref Range   Opiates NONE DETECTED NONE DETECTED   Cocaine NONE DETECTED NONE DETECTED   Benzodiazepines NONE DETECTED NONE DETECTED   Amphetamines NONE DETECTED NONE DETECTED   Tetrahydrocannabinol NONE DETECTED NONE DETECTED   Barbiturates NONE DETECTED NONE DETECTED    Comment:        DRUG SCREEN FOR MEDICAL PURPOSES ONLY.  IF CONFIRMATION IS NEEDED FOR ANY PURPOSE, NOTIFY LAB WITHIN 5 DAYS.        LOWEST DETECTABLE LIMITS FOR URINE DRUG SCREEN Drug Class       Cutoff (ng/mL) Amphetamine      1000 Barbiturate      200 Benzodiazepine   161 Tricyclics       096 Opiates          300 Cocaine          300 THC              50   CBC WITH DIFFERENTIAL     Status: None   Collection Time: 09/28/14  4:07 PM  Result Value Ref Range   WBC 9.8 4.0 - 10.5 K/uL   RBC 4.36 4.22 - 5.81 MIL/uL   Hemoglobin 13.0 13.0 - 17.0 g/dL   HCT 39.2 39.0 - 52.0 %   MCV 89.9 78.0 - 100.0 fL   MCH 29.8 26.0 - 34.0 pg   MCHC 33.2 30.0 - 36.0 g/dL   RDW 13.8 11.5 - 15.5 %   Platelets 311 150 - 400 K/uL   Neutrophils Relative % 69  43 - 77 %   Neutro Abs 6.8 1.7 - 7.7 K/uL   Lymphocytes Relative 22 12 - 46 %   Lymphs Abs 2.2  0.7 - 4.0 K/uL   Monocytes Relative 7 3 - 12 %   Monocytes Absolute 0.7 0.1 - 1.0 K/uL   Eosinophils Relative 2 0 - 5 %   Eosinophils Absolute 0.2 0.0 - 0.7 K/uL   Basophils Relative 0 0 - 1 %   Basophils Absolute 0.0 0.0 - 0.1 K/uL  Comprehensive metabolic panel     Status: Abnormal   Collection Time: 09/28/14  4:07 PM  Result Value Ref Range   Sodium 135 135 - 145 mmol/L   Potassium 4.6 3.5 - 5.1 mmol/L   Chloride 101 96 - 112 mmol/L   CO2 23 19 - 32 mmol/L   Glucose, Bld 217 (H) 70 - 99 mg/dL   BUN 20 6 - 23 mg/dL   Creatinine, Ser 1.18 0.50 - 1.35 mg/dL   Calcium 9.0 8.4 - 10.5 mg/dL   Total Protein 7.5 6.0 - 8.3 g/dL   Albumin 4.2 3.5 - 5.2 g/dL   AST 34 0 - 37 U/L   ALT 30 0 - 53 U/L   Alkaline Phosphatase 95 39 - 117 U/L   Total Bilirubin 0.5 0.3 - 1.2 mg/dL   GFR calc non Af Amer 65 (L) >90 mL/min   GFR calc Af Amer 75 (L) >90 mL/min    Comment: (NOTE) The eGFR has been calculated using the CKD EPI equation. This calculation has not been validated in all clinical situations. eGFR's persistently <90 mL/min signify possible Chronic Kidney Disease.    Anion gap 11 5 - 15  CBG monitoring, ED     Status: Abnormal   Collection Time: 09/29/14  4:15 AM  Result Value Ref Range   Glucose-Capillary 182 (H) 70 - 99 mg/dL  CBG monitoring, ED     Status: Abnormal   Collection Time: 09/29/14  8:29 AM  Result Value Ref Range   Glucose-Capillary 225 (H) 70 - 99 mg/dL    Vitals: Blood pressure 142/78, pulse 69, temperature 97.6 F (36.4 C), temperature source Axillary, resp. rate 18, SpO2 96 %.  Risk to Self: Suicidal Ideation: Yes-Currently Present Suicidal Intent: No-Not Currently/Within Last 6 Months Is patient at risk for suicide?: Yes Suicidal Plan?: No-Not Currently/Within Last 6 Months Access to Means: No What has been your use of drugs/alcohol within the last 12 months?:  none How many times?: 1 Other Self Harm Risks: na Triggers for Past Attempts: Other (Comment) (deaths in family) Intentional Self Injurious Behavior: None Risk to Others: Homicidal Ideation: Yes-Currently Present Thoughts of Harm to Others: No-Not Currently Present/Within Last 6 Months Current Homicidal Intent: No-Not Currently/Within Last 6 Months Current Homicidal Plan: No-Not Currently/Within Last 6 Months Access to Homicidal Means: No History of harm to others?: No Assessment of Violence: None Noted Does patient have access to weapons?: No Criminal Charges Pending?: No Does patient have a court date: No Prior Inpatient Therapy: Prior Inpatient Therapy: Yes Prior Therapy Dates: 2008-09 Prior Therapy Facilty/Provider(s): [facility] Reason for Treatment: SI Prior Outpatient Therapy: Prior Outpatient Therapy: Yes  Current Facility-Administered Medications  Medication Dose Route Frequency Provider Last Rate Last Dose  . acetaminophen (TYLENOL) tablet 650 mg  650 mg Oral Q4H PRN Margarita Mail, PA-C   650 mg at 09/29/14 0748  . alum & mag hydroxide-simeth (MAALOX/MYLANTA) 200-200-20 MG/5ML suspension 30 mL  30 mL Oral PRN Margarita Mail, PA-C      . aspirin EC tablet 81 mg  81 mg Oral Daily Margarita Mail, PA-C   81 mg at 09/29/14 1034  . buPROPion (  WELLBUTRIN SR) 12 hr tablet 150 mg  150 mg Oral BID Margarita Mail, PA-C   150 mg at 09/29/14 1034  . gabapentin (NEURONTIN) capsule 100 mg  100 mg Oral BID Margarita Mail, PA-C   100 mg at 09/29/14 1035  . glimepiride (AMARYL) tablet 4 mg  4 mg Oral Q breakfast Margarita Mail, PA-C   4 mg at 09/29/14 0836  . ibuprofen (ADVIL,MOTRIN) tablet 600 mg  600 mg Oral Q8H PRN Margarita Mail, PA-C   600 mg at 09/29/14 0121  . lisinopril (PRINIVIL,ZESTRIL) tablet 2.5 mg  2.5 mg Oral Daily Margarita Mail, PA-C   2.5 mg at 09/29/14 1034  . LORazepam (ATIVAN) tablet 1 mg  1 mg Oral Q8H PRN Margarita Mail, PA-C      . metFORMIN (GLUCOPHAGE) tablet 1,000 mg   1,000 mg Oral BID WC Margarita Mail, PA-C   1,000 mg at 09/29/14 0836  . metoprolol tartrate (LOPRESSOR) tablet 25 mg  25 mg Oral BID Margarita Mail, PA-C   25 mg at 09/29/14 1035  . nicotine (NICODERM CQ - dosed in mg/24 hours) patch 21 mg  21 mg Transdermal Daily Margarita Mail, PA-C   21 mg at 09/28/14 1724  . nitroGLYCERIN (NITROSTAT) SL tablet 0.4 mg  0.4 mg Sublingual Q5 min PRN Margarita Mail, PA-C      . ondansetron (ZOFRAN) tablet 4 mg  4 mg Oral Q8H PRN Margarita Mail, PA-C      . pantoprazole (PROTONIX) EC tablet 40 mg  40 mg Oral QPM Margarita Mail, PA-C   40 mg at 09/28/14 1726  . pravastatin (PRAVACHOL) tablet 20 mg  20 mg Oral Daily Margarita Mail, PA-C   20 mg at 09/29/14 1034  . vitamin C (ASCORBIC ACID) tablet 500 mg  500 mg Oral Daily Margarita Mail, PA-C   500 mg at 09/29/14 1034  . zolpidem (AMBIEN) tablet 5 mg  5 mg Oral QHS PRN Margarita Mail, PA-C   5 mg at 09/28/14 2117   Current Outpatient Prescriptions  Medication Sig Dispense Refill  . acetaminophen (TYLENOL) 500 MG tablet Take 1,000 mg by mouth every 6 (six) hours as needed. (for chest wall pain)    . Ascorbic Acid (VITAMIN C PO) Take 1 tablet by mouth daily.    Marland Kitchen aspirin 81 MG EC tablet Take 81 mg by mouth daily.      Marland Kitchen buPROPion (WELLBUTRIN SR) 150 MG 12 hr tablet Take 150 mg by mouth 2 (two) times daily.    Marland Kitchen gabapentin (NEURONTIN) 100 MG capsule Take 100 mg by mouth 3 (three) times daily.     Marland Kitchen glimepiride (AMARYL) 4 MG tablet Take 4 mg by mouth daily with breakfast.    . lisinopril (PRINIVIL,ZESTRIL) 2.5 MG tablet Take 2.5 mg by mouth daily.    . metFORMIN (GLUCOPHAGE) 1000 MG tablet Take 1,000 mg by mouth 2 (two) times daily with a meal.    . metoprolol tartrate (LOPRESSOR) 25 MG tablet Take 25 mg by mouth 2 (two) times daily.    . pantoprazole (PROTONIX) 40 MG tablet Take 40 mg by mouth every evening.     . pravastatin (PRAVACHOL) 20 MG tablet Take 20 mg by mouth daily.    . nitroGLYCERIN (NITROSTAT) 0.4 MG  SL tablet Place 1 tablet (0.4 mg total) under the tongue every 5 (five) minutes as needed. For chest pain (Patient not taking: Reported on 09/28/2014) 25 tablet 12    Musculoskeletal: Strength & Muscle Tone: within normal limits Gait &  Station: normal Patient leans: N/A  Psychiatric Specialty Exam:     Blood pressure 142/78, pulse 69, temperature 97.6 F (36.4 C), temperature source Axillary, resp. rate 18, SpO2 96 %.There is no weight on file to calculate BMI.  General Appearance: Casual and Fairly Groomed  Eye Contact::  Good  Speech:  Clear and Coherent and Normal Rate  Volume:  Normal  Mood:  Anxious and Depressed  Affect:  Congruent, Depressed, Flat and Tearful  Thought Process:  Coherent, Goal Directed and Intact  Orientation:  Full (Time, Place, and Person)  Thought Content:  WDL  Suicidal Thoughts:  Yes.  with intent/plan  Homicidal Thoughts:  No  Memory:  Immediate;   Good Recent;   Good Remote;   Good  Judgement:  Fair  Insight:  Good  Psychomotor Activity:  Psychomotor Retardation  Concentration:  Good  Recall:  NA  Fund of Knowledge:Good  Language: Good  Akathisia:  NA  Handed:  Right  AIMS (if indicated):     Assets:  Desire for Improvement  ADL's:  Intact  Cognition: WNL  Sleep:      Medical Decision Making: Established Problem, Worsening (2)  Treatment Plan Summary: Daily contact with patient to assess and evaluate symptoms and progress in treatment, Medication management and Plan admit to inpatient Psychiatric unit  Plan:  Recommend psychiatric Inpatient admission when medically cleared. Disposition: see above  Delfin Gant   PMHNP-BC 09/29/2014 4:49 PM Patient seen face-to-face for psychiatric evaluation, chart reviewed and case discussed with the physician extender and developed treatment plan. Reviewed the information documented and agree with the treatment plan. Corena Pilgrim, MD

## 2014-09-29 NOTE — ED Notes (Signed)
Pt calm with family. Assessment has been completed on pt waiting on placement. Ate breakfast

## 2014-09-29 NOTE — ED Notes (Addendum)
Calm and cooperative. Safety has been maintained with q15 min safety checks. Will continue current POC. Acuity low

## 2014-09-29 NOTE — Progress Notes (Addendum)
Pt recommended for inpatient, geripsych.  Pt referred to:   Tildon Husky at capacity.  [place], [place] [employer]  [employer] [phone]

## 2014-09-29 NOTE — ED Notes (Signed)
Pt ambulating for exercise. Cooperative in ambulating toward area asked to avoid.

## 2014-09-29 NOTE — ED Notes (Signed)
Pt making statements that he is depressed.  Doesn't feel like he should live anymore.

## 2014-09-30 LAB — CBG MONITORING, ED: Glucose-Capillary: 178 mg/dL — ABNORMAL HIGH (ref 70–99)

## 2014-09-30 NOTE — ED Notes (Signed)
Patient resting quietly in bed with eyes closed, Respirations equal and unlabored, skin warm and dry, NAD. No change in assessment or acuity. Q 15 minute safety checks remain in place.   

## 2014-09-30 NOTE — ED Notes (Signed)
Calm and cooperative. Safety has been maintained with q15 min safety checks. Will continue current POC. Acuity low

## 2014-09-30 NOTE — ED Notes (Signed)
Hilda Blades from [facility] called and reported that patient was accepted by Dr. Lucilla Edin. Number to call report [phone].

## 2014-09-30 NOTE — ED Notes (Signed)
Attempted to call EDP x 2 to inform of patient admission unable to reach at this time.

## 2014-12-10 ENCOUNTER — Ambulatory Visit (INDEPENDENT_AMBULATORY_CARE_PROVIDER_SITE_OTHER): Payer: No Typology Code available for payment source | Admitting: Psychiatry

## 2014-12-10 ENCOUNTER — Encounter (HOSPITAL_COMMUNITY): Payer: Self-pay | Admitting: Psychiatry

## 2014-12-10 VITALS — BP 126/89 | HR 66 | Ht 68.0 in | Wt 184.0 lb

## 2014-12-10 DIAGNOSIS — F332 Major depressive disorder, recurrent severe without psychotic features: Secondary | ICD-10-CM | POA: Diagnosis not present

## 2014-12-10 DIAGNOSIS — F329 Major depressive disorder, single episode, unspecified: Secondary | ICD-10-CM | POA: Insufficient documentation

## 2014-12-10 MED ORDER — TRAZODONE HCL 50 MG PO TABS: 50.0000 mg | ORAL_TABLET | Freq: Every day | ORAL | Status: DC

## 2014-12-10 MED ORDER — BUPROPION HCL ER (SR) 150 MG PO TB12: 150.0000 mg | ORAL_TABLET | Freq: Two times a day (BID) | ORAL | Status: DC

## 2014-12-10 MED ORDER — RISPERIDONE 1 MG PO TABS: 1.5000 mg | ORAL_TABLET | Freq: Every day | ORAL | Status: DC

## 2014-12-10 MED ORDER — DONEPEZIL HCL 5 MG PO TABS: 5.0000 mg | ORAL_TABLET | Freq: Every day | ORAL | Status: DC

## 2014-12-10 NOTE — Progress Notes (Signed)
Psychiatric Initial Adult Assessment   Patient Identification: [name] MRN:  [id number] Date of Evaluation:  12/10/2014 Referral Source: Dr. Manuella Ghazi Chief Complaint:   Chief Complaint    Depression; Establish Care     Visit Diagnosis:    ICD-9-CM ICD-10-CM   1. Major depressive disorder, recurrent, severe without psychotic features 296.33 F33.2    Diagnosis:   Patient Active Problem List   Diagnosis Date Noted  . Major depression [F32.2] 12/10/2014  . Major depressive disorder, recurrent, severe without psychotic behavior [F33.2] 09/29/2014  . Aggressive behavior of adult [F60.89]   . Homicidal ideation [R45.850]   . CAD (coronary artery disease) [I25.10]   . Type 2 diabetes mellitus [E11.9]   . Dementia [F03.90]   . Anxiety [F41.9]   . GERD (gastroesophageal reflux disease) [K21.9]   . Preoperative cardiovascular examination [Z01.810] 05/01/2013  . Memory loss [R41.3] 10/04/2012  . Status post coronary artery bypass grafting [Z95.1] 01/27/2011  . Essential hypertension, benign [I10] 01/27/2011  . COPD (chronic obstructive pulmonary disease) [J44.9] 01/27/2011  . Dyslipidemia [E78.5] 01/27/2011   History of Present Illness:  This patient is a 64 year old married white male who lives with his wife in [place]. 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 He has 2 children-a 28 year old son and a 40 year old daughter.  The patient was referred by Dr. Manuella Ghazi, his primary care physician for further treatment and assessment of depression and anxiety.  The patient states that he's been significantly depressed since 2008. During that year both his parents died within a short amount of time and he lost 56 other family members. He became very depressed angry and suicidal. He was hospitalized [facility] the behavioral [facility] and followed up here in this office for a few visits. He stopped coming here due to the copayment.  In 2012 he had  coronary artery bypass graft followed by several stents later in the year. He states that since these cardiac events he has not felt right. He had become increasingly depressed and had more problems with short-term memory.  The patient's 69 year old adopted son has a long history of bipolar disorder OCD and polysubstance abuse. He moved back to the family home a few months ago and this is caused a great deal of problems. He made the patient very angry. He became increasingly irritable depressed and suicidal and had homicidal thoughts toward his son. By April his symptoms were worse and he came to the [facility] long emergency room and was hospitalized at [facility] center. While there Risperdal was added to his regimen. It seems to have calmed him down quite a bit and he is less depressed. He still is having some problems with short-term memory but it seems to be getting a little bit better. He continued on the Wellbutrin and trazodone that he had been on before. Also his son has moved out is currently at [employer] with plans to go to a longer-term substance abuse treatment program. This is provided the patient with some relief.  Currently he states his mood is pretty good. He gets out walking with his dogs every day. He sleeps well most of the time. He is in pain from his shoulder surgeries and chronic neuropathy in his feet but he will not take pain medication. His mood is improved. He denies any thoughts of harming self or others and his wife has locked up all the family firearms. He denies auditory or visual  hallucinations or paranoid thinking. He still very concerned about finances as both he and his wife are unable to work. Denies history of direct trauma or abuse but states that his father was "always hard on me" Elements: Location Global severity severe, quality intermittent, precipitating factor cardiac surgery and conflicts with son Associated Signs/Symptoms: Depression Symptoms:  depressed  mood, anhedonia, psychomotor agitation, feelings of worthlessness/guilt, hopelessness, anxiety, (Hypo) Manic Symptoms:  Irritable Mood, Anxiety Symptoms:  Excessive Worry,   Past Medical History:  Past Medical History  Diagnosis Date  . Essential hypertension, benign   . Type 2 diabetes mellitus   . COPD (chronic obstructive pulmonary disease)   . Dyslipidemia   . CAD (coronary artery disease)     a. s/p CABG, DES to SVG to PLA, DES to RCA  b. low risk nuclear stress test 04/2014  . GERD (gastroesophageal reflux disease)   . Arthritis   . Anxiety   . Depression   . History of Bell's palsy   . Dementia     Past Surgical History  Procedure Laterality Date  . Shoulder surgery      Bilateral  . Appendectomy    . Cholecystectomy    . Coronary artery bypass graft  01/01/2011    LIMA to LAD, SVG to PLA, SVG to PDA  . Rotator cuff repair Bilateral   . Knee arthroscopy Left   . Hammer toe surgery Bilateral   . Tonsillectomy      tubes  . Tympanomastoidectomy Right 05/20/2013    Procedure: RIGHT CANAL UP TYMPANOMASTOIDECTOMY WITH TYPE I TYMPANOPLASTY AND INSERTION OF A TUBE;  Surgeon: Fannie Knee, MD;  Location: [place];  Service: ENT;  Laterality: Right;  . Left heart catheterization with coronary/graft angiogram N/A 03/01/2012    Procedure: LEFT HEART CATHETERIZATION WITH Beatrix Fetters;  Surgeon: Sherren Mocha, MD;  Location: [facility] CATH LAB;  Service: Cardiovascular;  Laterality: N/A;   Family History:  Family History  Problem Relation Age of Onset  . Diabetes    . Hypertension    . Alcohol abuse Brother    Social History:   History   Social History  . Marital Status: Married    Spouse Name: N/A  . Number of Children: N/A  . Years of Education: N/A   Social History Main Topics  . Smoking status: Former Smoker -- 1.00 packs/day for 50 years    Types: Cigarettes    Quit date: 12/26/2010  . Smokeless tobacco: Never Used  . Alcohol Use: No     Comment:  drinks alcohol occasionally  . Drug Use: No  . Sexual Activity: Yes   Other Topics Concern  . None   Social History Narrative   Disabled from prior shoulder surgery,he worked at a copper Clinical biochemist Social History: The patient grew up in [place]. He is 1 of 5 children raised by both parents. He states that all the children his father was the hardest on him. He is severely dyslexic and still cannot read. He he left school in the eighth grade. 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 He and his wife have been together 30 years but he adopted both of her children when they married.  Musculoskeletal: Strength & Muscle Tone: within normal limits Gait & Station: normal Patient leans: N/A  Psychiatric Specialty Exam: HPI  Review of Systems  Musculoskeletal: Positive for myalgias.  Neurological: Positive for tingling.  Psychiatric/Behavioral: Positive for depression. The patient  is nervous/anxious.   All other systems reviewed and are negative.   Blood pressure 126/89, pulse 66, height '5\' 8"'$  (1.727 m), weight 184 lb (83.462 kg).Body mass index is 27.98 kg/(m^2).  General Appearance: Casual, Neat and Well Groomed  Eye Contact:  Fair  Speech:  Slow  Volume:  Decreased  Mood:  Dysphoric  Affect:  Constricted  Thought Process:  Goal Directed  Orientation:  Full (Time, Place, and Person)  Thought Content:  Rumination  Suicidal Thoughts:  No  Homicidal Thoughts:  No  Memory:  Immediate;   Fair Recent;   Fair Remote;   Good  Judgement:  Fair  Insight:  Fair  Psychomotor Activity:  Normal  Concentration:  Fair  Recall:  Poor  Fund of Knowledge:Good  Language: Fair  Akathisia:  No  Handed:  Right  AIMS (if indicated):    Assets:  Communication Skills Desire for Improvement Leisure Time Resilience Social Support  ADL's:  Intact  Cognition: Impaired,  Mild dementia   Sleep:  Fairly good with medication    Is the patient at risk to  self?  No. Has the patient been a risk to self in the past 6 months?  Yes.   Has the patient been a risk to self within the distant past?  Yes.   Is the patient a risk to others?  No. Has the patient been a risk to others in the past 6 months?  Yes.   Has the patient been a risk to others within the distant past?  No.  Allergies:   Allergies  Allergen Reactions  . Bee Venom Anaphylaxis  . Codeine Shortness Of Breath and Other (See Comments)   Current Medications: Current Outpatient Prescriptions  Medication Sig Dispense Refill  . acetaminophen (TYLENOL) 500 MG tablet Take 1,000 mg by mouth every 6 (six) hours as needed. (for chest wall pain)    . Ascorbic Acid (VITAMIN C PO) Take 1 tablet by mouth daily.    Marland Kitchen aspirin 81 MG EC tablet Take 81 mg by mouth daily.      Marland Kitchen buPROPion (WELLBUTRIN SR) 150 MG 12 hr tablet Take 1 tablet (150 mg total) by mouth 2 (two) times daily. 180 tablet 2  . donepezil (ARICEPT) 5 MG tablet Take 1 tablet (5 mg total) by mouth at bedtime. 90 tablet 2  . gabapentin (NEURONTIN) 100 MG capsule Take 100 mg by mouth 3 (three) times daily.     Marland Kitchen glimepiride (AMARYL) 4 MG tablet Take 4 mg by mouth daily with breakfast.    . lisinopril (PRINIVIL,ZESTRIL) 2.5 MG tablet Take 2.5 mg by mouth daily.    . metFORMIN (GLUCOPHAGE) 1000 MG tablet Take 1,000 mg by mouth 2 (two) times daily with a meal.    . metoprolol tartrate (LOPRESSOR) 25 MG tablet Take 25 mg by mouth 2 (two) times daily.    . Multiple Vitamins-Minerals (ZINC PO) Take by mouth daily.    . nitroGLYCERIN (NITROSTAT) 0.4 MG SL tablet Place 1 tablet (0.4 mg total) under the tongue every 5 (five) minutes as needed. For chest pain 25 tablet 12  . pantoprazole (PROTONIX) 40 MG tablet Take 40 mg by mouth every evening.     . pravastatin (PRAVACHOL) 20 MG tablet Take 20 mg by mouth daily.    . risperiDONE (RISPERDAL) 1 MG tablet Take 1.5 tablets (1.5 mg total) by mouth daily. Taking 1.5 Tablets QD 135 tablet 2  .  traZODone (DESYREL) 50 MG tablet Take 1 tablet (50  mg total) by mouth at bedtime. 90 tablet 2   No current facility-administered medications for this visit.    Previous Psychotropic Medications: Yes   Substance Abuse History in the last 12 months:  No.  Consequences of Substance Abuse: NA  Medical Decision Making:  Established Problem, Stable/Improving (1), Review of Psycho-Social Stressors (1), Review or order clinical lab tests (1), Review and summation of old records (2), Independent Review of image, tracing or specimen (2) and Review of Medication Regimen & Side Effects (2)  Treatment Plan Summary: Medication management  Patient feels like he has fairly stable for now and no longer has any thoughts of hurting self or others. He does have some mild tremor in both hands but this preceded the use of Risperdal. He will continue Risperdal 1.5 mg daily for mood stabilization, Wellbutrin SR 150 mgtwice a day for depression and trazodone 50 mg at bedtime for sleep. Counseling was strongly suggested he declined due to finances. He'll return to see me in 6 weeks or call sooner if symptoms worsen.  Joory Gough, [facility] 6/15/20163:00 PM

## 2015-01-15 ENCOUNTER — Encounter (HOSPITAL_COMMUNITY): Payer: Self-pay | Admitting: Psychiatry

## 2015-01-15 ENCOUNTER — Ambulatory Visit (INDEPENDENT_AMBULATORY_CARE_PROVIDER_SITE_OTHER): Payer: No Typology Code available for payment source | Admitting: Psychiatry

## 2015-01-15 VITALS — BP 114/65 | HR 63 | Ht 68.0 in | Wt 183.2 lb

## 2015-01-15 DIAGNOSIS — F332 Major depressive disorder, recurrent severe without psychotic features: Secondary | ICD-10-CM

## 2015-01-15 NOTE — Progress Notes (Signed)
Patient ID: [name], male   DOB: 01/25/1951, 64 y.o.   MRN: [id number]  Psychiatric follow-up Adult Assessment   Patient Identification: [name] MRN:  [id number] Date of Evaluation:  01/15/2015 Referral Source: Dr. Manuella Ghazi Chief Complaint:   Chief Complaint    Depression; Anxiety; Follow-up     Visit Diagnosis:    ICD-9-CM ICD-10-CM   1. Major depressive disorder, recurrent, severe without psychotic features 296.33 F33.2    Diagnosis:   Patient Active Problem List   Diagnosis Date Noted  . Major depression [F32.2] 12/10/2014  . Major depressive disorder, recurrent, severe without psychotic behavior [F33.2] 09/29/2014  . Aggressive behavior of adult [F60.89]   . Homicidal ideation [R45.850]   . CAD (coronary artery disease) [I25.10]   . Type 2 diabetes mellitus [E11.9]   . Dementia [F03.90]   . Anxiety [F41.9]   . GERD (gastroesophageal reflux disease) [K21.9]   . Preoperative cardiovascular examination [Z01.810] 05/01/2013  . Memory loss [R41.3] 10/04/2012  . Status post coronary artery bypass grafting [Z95.1] 01/27/2011  . Essential hypertension, benign [I10] 01/27/2011  . COPD (chronic obstructive pulmonary disease) [J44.9] 01/27/2011  . Dyslipidemia [E78.5] 01/27/2011   History of Present Illness:  This patient is a 64 year old married white male who lives with his wife in [place]. 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 He has 2 children-a 90 year old son and a 23 year old daughter.  The patient was referred by Dr. Manuella Ghazi, his primary care physician for further treatment and assessment of depression and anxiety.  The patient states that he's been significantly depressed since 2008. During that year both his parents died within a short amount of time and he lost 66 other family members. He became very depressed angry and suicidal. He was hospitalized at the behavioral health hospital and followed up here in this office  for a few visits. He stopped coming here due to the copayment.  In 2012 he had coronary artery bypass graft followed by several stents later in the year. He states that since these cardiac events he has not felt right. He had become increasingly depressed and had more problems with short-term memory.  The patient's 62 year old adopted son has a long history of bipolar disorder OCD and polysubstance abuse. He moved back to the family home a few months ago and this is caused a great deal of problems. He made the patient very angry. He became increasingly irritable depressed and suicidal and had homicidal thoughts toward his son. By April his symptoms were worse and he came to the [facility] long emergency room and was hospitalized at [facility] center. While there Risperdal was added to his regimen. It seems to have calmed him down quite a bit and he is less depressed. He still is having some problems with short-term memory but it seems to be getting a little bit better. He continued on the Wellbutrin and trazodone that he had been on before. Also his son has moved out is currently at [employer] with plans to go to a longer-term substance abuse treatment program. This is provided the patient with some relief.  Currently he states his mood is pretty good. He gets out walking with his dogs every day. He sleeps well most of the time. He is in pain from his shoulder surgeries and chronic neuropathy in his feet but he will not take pain medication. His mood is improved. He denies any thoughts of harming self or  others and his wife has locked up all the family firearms. He denies auditory or visual hallucinations or paranoid thinking. He still very concerned about finances as both he and his wife are unable to work. Denies history of direct trauma or abuse but states that his father was "always hard on me"  The patient returns after 4 weeks. He is here with his wife. He is doing okay but his 33 year old son  has moved back into the home after attending a rehabilitation program. Unfortunately his son went right back to using marijuana and alcohol. The wife is trying to deal with the son so the patient doesn't have to deal with them and become upset. They have set a deadline of July 30 for the son to move out and they're hoping he will do so with the help of his probation officer. The patient states that he feels tired and groggy a lot so I suggested we cut back the Risperdal little bit from 1.5-1 mg at bedtime. He denies being severely depressed but he is a bit flat. He denies suicidal or homicidal ideation Elements: Location Global severity severe, quality intermittent, precipitating factor cardiac surgery and conflicts with son Associated Signs/Symptoms: Depression Symptoms:  depressed mood, anhedonia, psychomotor agitation, feelings of worthlessness/guilt, hopelessness, anxiety, (Hypo) Manic Symptoms:  Irritable Mood, Anxiety Symptoms:  Excessive Worry,   Past Medical History:  Past Medical History  Diagnosis Date  . Essential hypertension, benign   . Type 2 diabetes mellitus   . COPD (chronic obstructive pulmonary disease)   . Dyslipidemia   . CAD (coronary artery disease)     a. s/p CABG, DES to SVG to PLA, DES to RCA  b. low risk nuclear stress test 04/2014  . GERD (gastroesophageal reflux disease)   . Arthritis   . Anxiety   . Depression   . History of Bell's palsy   . Dementia     Past Surgical History  Procedure Laterality Date  . Shoulder surgery      Bilateral  . Appendectomy    . Cholecystectomy    . Coronary artery bypass graft  01/01/2011    LIMA to LAD, SVG to PLA, SVG to PDA  . Rotator cuff repair Bilateral   . Knee arthroscopy Left   . Hammer toe surgery Bilateral   . Tonsillectomy      tubes  . Tympanomastoidectomy Right 05/20/2013    Procedure: RIGHT CANAL UP TYMPANOMASTOIDECTOMY WITH TYPE I TYMPANOPLASTY AND INSERTION OF A TUBE;  Surgeon: Fannie Knee, MD;   Location: [place];  Service: ENT;  Laterality: Right;  . Left heart catheterization with coronary/graft angiogram N/A 03/01/2012    Procedure: LEFT HEART CATHETERIZATION WITH Beatrix Fetters;  Surgeon: Sherren Mocha, MD;  Location: [facility] CATH LAB;  Service: Cardiovascular;  Laterality: N/A;   Family History:  Family History  Problem Relation Age of Onset  . Diabetes    . Hypertension    . Alcohol abuse Brother    Social History:   History   Social History  . Marital Status: Married    Spouse Name: N/A  . Number of Children: N/A  . Years of Education: N/A   Social History Main Topics  . Smoking status: Former Smoker -- 1.00 packs/day for 50 years    Types: Cigarettes    Quit date: 12/26/2010  . Smokeless tobacco: Never Used  . Alcohol Use: No     Comment: drinks alcohol occasionally  . Drug Use: No  . Sexual Activity:  Yes   Other Topics Concern  . None   Social History Narrative   Disabled from prior shoulder surgery,he worked at a copper Clinical biochemist Social History: The patient grew up in [place]. He is 1 of 5 children raised by both parents. He states that all the children his father was the hardest on him. He is severely dyslexic and still cannot read. He he left school in the eighth grade. 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 He and his wife have been together 30 years but he adopted both of her children when they married.  Musculoskeletal: Strength & Muscle Tone: within normal limits Gait & Station: normal Patient leans: N/A  Psychiatric Specialty Exam: Anxiety Symptoms include nervous/anxious behavior.      Review of Systems  Musculoskeletal: Positive for myalgias.  Neurological: Positive for tingling.  Psychiatric/Behavioral: Positive for depression. The patient is nervous/anxious.   All other systems reviewed and are negative.   Blood pressure 114/65, pulse 63, height '5\' 8"'$  (1.727 m), weight 183 lb 3.2 oz  (83.099 kg).Body mass index is 27.86 kg/(m^2).  General Appearance: Casual, Neat and Well Groomed  Eye Contact:  Fair  Speech:  Slow  Volume:  Decreased  Mood:  Dysphoric  Affect:  Constricted and flat  Thought Process:  Goal Directed  Orientation:  Full (Time, Place, and Person)  Thought Content:  Rumination  Suicidal Thoughts:  No  Homicidal Thoughts:  No  Memory:  Immediate;   Fair Recent;   Fair Remote;   Good  Judgement:  Fair  Insight:  Fair  Psychomotor Activity:  Normal  Concentration:  Fair  Recall:  Poor  Fund of Knowledge:Good  Language: Fair  Akathisia:  No  Handed:  Right  AIMS (if indicated):    Assets:  Communication Skills Desire for Improvement Leisure Time Resilience Social Support  ADL's:  Intact  Cognition: Impaired,  Mild dementia   Sleep:  Fairly good with medication    Is the patient at risk to self?  No. Has the patient been a risk to self in the past 6 months?  Yes.   Has the patient been a risk to self within the distant past?  Yes.   Is the patient a risk to others?  No. Has the patient been a risk to others in the past 6 months?  Yes.   Has the patient been a risk to others within the distant past?  No.  Allergies:   Allergies  Allergen Reactions  . Bee Venom Anaphylaxis  . Codeine Shortness Of Breath and Other (See Comments)   Current Medications: Current Outpatient Prescriptions  Medication Sig Dispense Refill  . acetaminophen (TYLENOL) 500 MG tablet Take 1,000 mg by mouth every 6 (six) hours as needed. (for chest wall pain)    . Ascorbic Acid (VITAMIN C PO) Take 1 tablet by mouth daily.    Marland Kitchen aspirin 81 MG EC tablet Take 81 mg by mouth daily.      Marland Kitchen BIOTIN PO Take 1,000 mg by mouth daily.    Marland Kitchen buPROPion (WELLBUTRIN SR) 150 MG 12 hr tablet Take 1 tablet (150 mg total) by mouth 2 (two) times daily. 180 tablet 2  . clopidogrel (PLAVIX) 75 MG tablet Take 75 mg by mouth daily.    Marland Kitchen donepezil (ARICEPT) 5 MG tablet Take 1 tablet (5 mg  total) by mouth at bedtime. 90 tablet 2  . gabapentin (NEURONTIN) 100 MG capsule Take  100 mg by mouth 3 (three) times daily.     Marland Kitchen glimepiride (AMARYL) 4 MG tablet Take 4 mg by mouth daily with breakfast.    . lisinopril (PRINIVIL,ZESTRIL) 2.5 MG tablet Take 2.5 mg by mouth daily.    . metFORMIN (GLUCOPHAGE) 1000 MG tablet Take 1,000 mg by mouth 2 (two) times daily with a meal.    . metoprolol tartrate (LOPRESSOR) 25 MG tablet Take 25 mg by mouth 2 (two) times daily.    . Multiple Vitamins-Minerals (ZINC PO) Take by mouth daily.    . Multiple Vitamins-Minerals (ZINC PO) Take by mouth as needed.    . nitroGLYCERIN (NITROSTAT) 0.4 MG SL tablet Place 1 tablet (0.4 mg total) under the tongue every 5 (five) minutes as needed. For chest pain 25 tablet 12  . Omega-3 Fatty Acids (FISH OIL PO) Take 1,400 mg by mouth daily.    . pantoprazole (PROTONIX) 40 MG tablet Take 40 mg by mouth every evening.     . pravastatin (PRAVACHOL) 20 MG tablet Take 20 mg by mouth daily.    . risperiDONE (RISPERDAL) 1 MG tablet Take 1.5 tablets (1.5 mg total) by mouth daily. Taking 1.5 Tablets QD 135 tablet 2  . traZODone (DESYREL) 50 MG tablet Take 1 tablet (50 mg total) by mouth at bedtime. 90 tablet 2   No current facility-administered medications for this visit.    Previous Psychotropic Medications: Yes   Substance Abuse History in the last 12 months:  No.  Consequences of Substance Abuse: NA  Medical Decision Making:  Established Problem, Stable/Improving (1), Review of Psycho-Social Stressors (1), Review or order clinical lab tests (1), Review and summation of old records (2), Independent Review of image, tracing or specimen (2) and Review of Medication Regimen & Side Effects (2)  Treatment Plan Summary: Medication management  Patient feels like he has fairly stable I reemphasized the necessity for the son to move out to avoid further conflicts that would lead to the patient's deterioration. He will continue  Wellbutrin for depression and trazodone for sleep. He will decrease Risperdal to 1 mg at bedtime for mood stabilization. He'll return in 6 weeks  [place], [facility] 7/21/20169:46 AM

## 2015-01-15 NOTE — Patient Instructions (Signed)
Decrease risperdal to one pill at bedtime

## 2015-01-21 ENCOUNTER — Ambulatory Visit (HOSPITAL_COMMUNITY): Payer: Self-pay | Admitting: Psychiatry

## 2015-02-27 ENCOUNTER — Ambulatory Visit (HOSPITAL_COMMUNITY): Payer: Self-pay | Admitting: Psychiatry

## 2015-03-16 ENCOUNTER — Ambulatory Visit (HOSPITAL_COMMUNITY): Payer: Self-pay | Admitting: Psychiatry

## 2015-04-24 ENCOUNTER — Ambulatory Visit (HOSPITAL_COMMUNITY): Payer: Self-pay | Admitting: Psychiatry

## 2015-05-14 ENCOUNTER — Encounter (HOSPITAL_COMMUNITY): Payer: Self-pay | Admitting: Psychiatry

## 2015-05-14 ENCOUNTER — Ambulatory Visit (INDEPENDENT_AMBULATORY_CARE_PROVIDER_SITE_OTHER): Payer: No Typology Code available for payment source | Admitting: Psychiatry

## 2015-05-14 VITALS — BP 127/80 | HR 77 | Ht 68.0 in | Wt 188.2 lb

## 2015-05-14 DIAGNOSIS — F332 Major depressive disorder, recurrent severe without psychotic features: Secondary | ICD-10-CM | POA: Diagnosis not present

## 2015-05-14 MED ORDER — BUPROPION HCL ER (SR) 150 MG PO TB12: 150.0000 mg | ORAL_TABLET | Freq: Two times a day (BID) | ORAL | Status: AC

## 2015-05-14 MED ORDER — DONEPEZIL HCL 5 MG PO TABS: 5.0000 mg | ORAL_TABLET | Freq: Every day | ORAL | Status: DC

## 2015-05-14 MED ORDER — TRAZODONE HCL 50 MG PO TABS: 50.0000 mg | ORAL_TABLET | Freq: Every day | ORAL | Status: DC

## 2015-05-14 MED ORDER — RISPERIDONE 1 MG PO TABS: 1.5000 mg | ORAL_TABLET | Freq: Every day | ORAL | Status: DC

## 2015-05-14 NOTE — Progress Notes (Signed)
Patient ID: Koren Sermersheim, male   DOB: 10-28-1950, 64 y.o.   MRN: [id number] Patient ID: Markelle Najarian, male   DOB: [birth year], 64 y.o.   MRN: [id number]  Psychiatric follow-up Adult Assessment   Patient Identification: [name] MRN:  [id number] Date of Evaluation:  05/14/2015 Referral Source: Dr. Manuella Ghazi Chief Complaint:   Chief Complaint    Manic Behavior; Depression; Follow-up     Visit Diagnosis:    ICD-9-CM ICD-10-CM   1. Major depressive disorder, recurrent, severe without psychotic features ([place]) 296.33 F33.2    Diagnosis:   Patient Active Problem List   Diagnosis Date Noted  . Major depression ([place]) [F32.9] 12/10/2014  . Major depressive disorder, recurrent, severe without psychotic behavior ([place]) [F33.2] 09/29/2014  . Aggressive behavior of adult [F60.89]   . Homicidal ideation [R45.850]   . CAD (coronary artery disease) [I25.10]   . Type 2 diabetes mellitus ([place]) [E11.9]   . Dementia [F03.90]   . Anxiety [F41.9]   . GERD (gastroesophageal reflux disease) [K21.9]   . Preoperative cardiovascular examination [Z01.810] 05/01/2013  . Memory loss [R41.3] 10/04/2012  . Status post coronary artery bypass grafting [Z95.1] 01/27/2011  . Essential hypertension, benign [I10] 01/27/2011  . COPD (chronic obstructive pulmonary disease) ([place]) [J44.9] 01/27/2011  . Dyslipidemia [E78.5] 01/27/2011   History of Present Illness:  This patient is a 64 year old married white male who lives with his wife in [place]. 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 He has 2 children-a 81 year old son and a 70 year old daughter.  The patient was referred by Dr. Manuella Ghazi, his primary care physician for further treatment and assessment of depression and anxiety.  The patient states that he's been significantly depressed since 2008. During that year both his parents died within a short amount of time and he lost 3 other family members. He became very depressed  angry and suicidal. He was hospitalized at the behavioral health hospital and followed up here in this office for a few visits. He stopped coming here due to the copayment.  In 2012 he had coronary artery bypass graft followed by several stents later in the year. He states that since these cardiac events he has not felt right. He had become increasingly depressed and had more problems with short-term memory.  The patient's 63 year old adopted son has a long history of bipolar disorder OCD and polysubstance abuse. He moved back to the family home a few months ago and this is caused a great deal of problems. He made the patient very angry. He became increasingly irritable depressed and suicidal and had homicidal thoughts toward his son. By April his symptoms were worse and he came to the [facility] long emergency room and was hospitalized at [facility] center. While there Risperdal was added to his regimen. It seems to have calmed him down quite a bit and he is less depressed. He still is having some problems with short-term memory but it seems to be getting a little bit better. He continued on the Wellbutrin and trazodone that he had been on before. Also his son has moved out is currently at [employer] with plans to go to a longer-term substance abuse treatment program. This is provided the patient with some relief.  Currently he states his mood is pretty good. He gets out walking with his dogs every day. He sleeps well most of the time. He is in pain from his shoulder surgeries and chronic neuropathy in  his feet but he will not take pain medication. His mood is improved. He denies any thoughts of harming self or others and his wife has locked up all the family firearms. He denies auditory or visual hallucinations or paranoid thinking. He still very concerned about finances as both he and his wife are unable to work. Denies history of direct trauma or abuse but states that his father was "always hard on  me"  The patient returns after 4  Months. He is here on his own. He states that he's been doing okay and that his son moved out several months ago. When his son isn't there his mood is much more stable. He and his son tend argue about the son substance abuse. He states he is sleeping and eating well his energy is good and he is getting out walking. He denies being depressed and has no auditory or visual hallucinations or paranoia. He states that his short-term memory seems to be improving Elements: Location Global severity severe, quality intermittent, precipitating factor cardiac surgery and conflicts with son Associated Signs/Symptoms: Depression Symptoms:  depressed mood, anhedonia, psychomotor agitation, feelings of worthlessness/guilt, hopelessness, anxiety, (Hypo) Manic Symptoms:  Irritable Mood, Anxiety Symptoms:  Excessive Worry,   Past Medical History:  Past Medical History  Diagnosis Date  . Essential hypertension, benign   . Type 2 diabetes mellitus ([place])   . COPD (chronic obstructive pulmonary disease) ([place])   . Dyslipidemia   . CAD (coronary artery disease)     a. s/p CABG, DES to SVG to PLA, DES to RCA  b. low risk nuclear stress test 04/2014  . GERD (gastroesophageal reflux disease)   . Arthritis   . Anxiety   . Depression   . History of Bell's palsy   . Dementia     Past Surgical History  Procedure Laterality Date  . Shoulder surgery      Bilateral  . Appendectomy    . Cholecystectomy    . Coronary artery bypass graft  01/01/2011    LIMA to LAD, SVG to PLA, SVG to PDA  . Rotator cuff repair Bilateral   . Knee arthroscopy Left   . Hammer toe surgery Bilateral   . Tonsillectomy      tubes  . Tympanomastoidectomy Right 05/20/2013    Procedure: RIGHT CANAL UP TYMPANOMASTOIDECTOMY WITH TYPE I TYMPANOPLASTY AND INSERTION OF A TUBE;  Surgeon: Fannie Knee, MD;  Location: [facility];  Service: ENT;  Laterality: Right;  . Left heart catheterization with coronary/graft  angiogram N/A 03/01/2012    Procedure: LEFT HEART CATHETERIZATION WITH Beatrix Fetters;  Surgeon: Sherren Mocha, MD;  Location: [facility] CATH LAB;  Service: Cardiovascular;  Laterality: N/A;   Family History:  Family History  Problem Relation Age of Onset  . Diabetes    . Hypertension    . Alcohol abuse Brother    Social History:   Social History   Social History  . Marital Status: Married    Spouse Name: N/A  . Number of Children: N/A  . Years of Education: N/A   Social History Main Topics  . Smoking status: Former Smoker -- 1.00 packs/day for 50 years    Types: Cigarettes    Quit date: 12/26/2010  . Smokeless tobacco: Never Used  . Alcohol Use: No     Comment: drinks alcohol occasionally  . Drug Use: No  . Sexual Activity: Yes   Other Topics Concern  . None   Social History Narrative   Disabled from  prior shoulder surgery,he worked at a copper Clinical biochemist Social History: The patient grew up in [place]. He is 1 of 5 children raised by both parents. He states that all the children his father was the hardest on him. He is severely dyslexic and still cannot read. He he left school in the eighth grade. 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 He and his wife have been together 30 years but he adopted both of her children when they married.  Musculoskeletal: Strength & Muscle Tone: within normal limits Gait & Station: normal Patient leans: N/A  Psychiatric Specialty Exam: Depression        Associated symptoms include myalgias.  Past medical history includes anxiety.   Anxiety Symptoms include nervous/anxious behavior.      Review of Systems  Musculoskeletal: Positive for myalgias.  Neurological: Positive for tingling.  Psychiatric/Behavioral: Positive for depression. The patient is nervous/anxious.   All other systems reviewed and are negative.   Blood pressure 127/80, pulse 77, height '5\' 8"'$  (1.727 m), weight 188 lb 3.2  oz (85.367 kg), SpO2 91 %.Body mass index is 28.62 kg/(m^2).  General Appearance: Casual, Neat and Well Groomed  Eye Contact:  Fair  Speech:  Slow  Volume:  Decreased  Mood:  Fairly good   Affect:  Constricted and flat but better than last time   Thought Process:  Goal Directed  Orientation:  Full (Time, Place, and Person)  Thought Content:  Rumination  Suicidal Thoughts:  No  Homicidal Thoughts:  No  Memory:  Immediate;   Fair Recent;   Fair Remote;   Good  Judgement:  Fair  Insight:  Fair  Psychomotor Activity:  Normal  Concentration:  Fair  Recall:  Poor  Fund of Knowledge:Good  Language: Fair  Akathisia:  No  Handed:  Right  AIMS (if indicated):    Assets:  Communication Skills Desire for Improvement Leisure Time Resilience Social Support  ADL's:  Intact  Cognition: Impaired,  Mild dementia   Sleep:  Fairly good with medication    Is the patient at risk to self?  No. Has the patient been a risk to self in the past 6 months?  Yes.   Has the patient been a risk to self within the distant past?  Yes.   Is the patient a risk to others?  No. Has the patient been a risk to others in the past 6 months?  Yes.   Has the patient been a risk to others within the distant past?  No.  Allergies:   Allergies  Allergen Reactions  . Bee Venom Anaphylaxis  . Codeine Shortness Of Breath and Other (See Comments)   Current Medications: Current Outpatient Prescriptions  Medication Sig Dispense Refill  . Ascorbic Acid (VITAMIN C PO) Take 1 tablet by mouth daily.    Marland Kitchen aspirin 81 MG EC tablet Take 81 mg by mouth daily.      Marland Kitchen BIOTIN PO Take 1,000 mg by mouth daily.    Marland Kitchen buPROPion (WELLBUTRIN SR) 150 MG 12 hr tablet Take 1 tablet (150 mg total) by mouth 2 (two) times daily. 180 tablet 2  . clopidogrel (PLAVIX) 75 MG tablet Take 75 mg by mouth daily.    Marland Kitchen donepezil (ARICEPT) 5 MG tablet Take 1 tablet (5 mg total) by mouth at bedtime. 90 tablet 2  . gabapentin (NEURONTIN) 100 MG  capsule Take 100 mg by mouth 3 (three) times daily.     Marland Kitchen  glimepiride (AMARYL) 4 MG tablet Take 4 mg by mouth daily with breakfast.    . lisinopril (PRINIVIL,ZESTRIL) 2.5 MG tablet Take 2.5 mg by mouth daily.    . metFORMIN (GLUCOPHAGE) 1000 MG tablet Take 1,000 mg by mouth 2 (two) times daily with a meal.    . metoprolol tartrate (LOPRESSOR) 25 MG tablet Take 25 mg by mouth 2 (two) times daily.    . Multiple Vitamins-Minerals (MULTIVITAMIN MEN PO) Take by mouth daily.    . Multiple Vitamins-Minerals (ZINC PO) Take by mouth as needed.    . nitroGLYCERIN (NITROSTAT) 0.4 MG SL tablet Place 1 tablet (0.4 mg total) under the tongue every 5 (five) minutes as needed. For chest pain 25 tablet 12  . Omega-3 Fatty Acids (FISH OIL PO) Take 1,400 mg by mouth daily.    . pantoprazole (PROTONIX) 40 MG tablet Take 40 mg by mouth every evening.     . pravastatin (PRAVACHOL) 20 MG tablet Take 20 mg by mouth daily.    . risperiDONE (RISPERDAL) 1 MG tablet Take 1.5 tablets (1.5 mg total) by mouth daily. Taking 1.5 Tablets QD 135 tablet 2  . traZODone (DESYREL) 50 MG tablet Take 1 tablet (50 mg total) by mouth at bedtime. 90 tablet 2  . acetaminophen (TYLENOL) 500 MG tablet Take 1,000 mg by mouth every 6 (six) hours as needed. (for chest wall pain)    . Multiple Vitamins-Minerals (ZINC PO) Take by mouth daily.     No current facility-administered medications for this visit.    Previous Psychotropic Medications: Yes   Substance Abuse History in the last 12 months:  No.  Consequences of Substance Abuse: NA  Medical Decision Making:  Established Problem, Stable/Improving (1), Review of Psycho-Social Stressors (1), Review or order clinical lab tests (1), Review and summation of old records (2), Independent Review of image, tracing or specimen (2) and Review of Medication Regimen & Side Effects (2)  Treatment Plan Summary: Medication management  Patient feels like he has fairly stable I reemphasized the  necessity for the son to move out to avoid further conflicts that would lead to the patient's deterioration. He will continue Wellbutrin for depression and trazodone for sleep. He will decrease Risperdal to 1 mg at bedtime for mood stabilization. He'll return in 4 months  Ladonne Sharples, [facility] 11/17/20164:25 PM

## 2015-06-25 ENCOUNTER — Encounter: Payer: Self-pay | Admitting: *Deleted

## 2015-06-25 ENCOUNTER — Encounter: Payer: Self-pay | Admitting: Cardiology

## 2015-06-25 ENCOUNTER — Ambulatory Visit (INDEPENDENT_AMBULATORY_CARE_PROVIDER_SITE_OTHER): Payer: Medicare Other | Admitting: Cardiology

## 2015-06-25 VITALS — BP 122/82 | HR 64 | Ht 68.0 in | Wt 199.0 lb

## 2015-06-25 DIAGNOSIS — I2510 Atherosclerotic heart disease of native coronary artery without angina pectoris: Secondary | ICD-10-CM

## 2015-06-25 DIAGNOSIS — E785 Hyperlipidemia, unspecified: Secondary | ICD-10-CM

## 2015-06-25 DIAGNOSIS — I10 Essential (primary) hypertension: Secondary | ICD-10-CM

## 2015-06-25 NOTE — Progress Notes (Signed)
Cardiology Office Note  Date: 06/25/2015   ID: [name], DOB December 17, 1950, MRN [id number]  PCP: Monico Blitz, MD  Primary Cardiologist: Rozann Lesches, MD   Chief Complaint  Patient presents with  . Coronary Artery Disease    History of Present Illness: [name] is a 63 y.o. male last seen in January. He is here with his wife for a routine follow-up visit. He states that he has been doing well from a cardiac perspective over the last year. He exercises 3 days a week at a local gym. He does not report any angina symptoms and has NYHA class II dyspnea with typical activities.  We reviewed his medications which are outlined below. Cardiac regimen includes aspirin, Plavix, lisinopril, Lopressor, and Pravachol. He reports having had lab work done with Dr. Manuella Ghazi including lipid panel since I last saw him.  Ischemic testing from last year was low risk, and we continue medical therapy and observation ECG today shows sinus rhythm with right bundle branch block and left anterior fascicular block..  Past Medical History  Diagnosis Date  . Essential hypertension, benign   . Type 2 diabetes mellitus ([place])   . COPD (chronic obstructive pulmonary disease) ([place])   . Dyslipidemia   . CAD (coronary artery disease)     a. s/p CABG, DES to SVG to PLA, DES to RCA  b. low risk nuclear stress test 04/2014  . GERD (gastroesophageal reflux disease)   . Arthritis   . Anxiety   . Depression   . History of Bell's palsy   . Dementia     Current Outpatient Prescriptions  Medication Sig Dispense Refill  . acetaminophen (TYLENOL) 500 MG tablet Take 1,000 mg by mouth every 6 (six) hours as needed. (for chest wall pain)    . Ascorbic Acid (VITAMIN C PO) Take 1 tablet by mouth daily.    Marland Kitchen aspirin 81 MG EC tablet Take 81 mg by mouth daily.      Marland Kitchen buPROPion (WELLBUTRIN SR) 150 MG 12 hr tablet Take 1 tablet (150 mg total) by mouth 2 (two) times daily. 180 tablet 2  . clopidogrel (PLAVIX) 75 MG tablet  Take 75 mg by mouth daily.    Marland Kitchen donepezil (ARICEPT) 5 MG tablet Take 1 tablet (5 mg total) by mouth at bedtime. 90 tablet 2  . gabapentin (NEURONTIN) 100 MG capsule Take 100 mg by mouth 3 (three) times daily.     Marland Kitchen glimepiride (AMARYL) 4 MG tablet Take 4 mg by mouth daily with breakfast.    . lisinopril (PRINIVIL,ZESTRIL) 2.5 MG tablet Take 2.5 mg by mouth daily.    . metFORMIN (GLUCOPHAGE) 1000 MG tablet Take 1,000 mg by mouth 2 (two) times daily with a meal.    . metoprolol tartrate (LOPRESSOR) 25 MG tablet Take 25 mg by mouth 2 (two) times daily.    . Multiple Vitamins-Minerals (MULTIVITAMIN MEN PO) Take by mouth daily.    . Multiple Vitamins-Minerals (ZINC PO) Take by mouth daily.    . Multiple Vitamins-Minerals (ZINC PO) Take by mouth as needed.    . nitroGLYCERIN (NITROSTAT) 0.4 MG SL tablet Place 1 tablet (0.4 mg total) under the tongue every 5 (five) minutes as needed. For chest pain 25 tablet 12  . Omega-3 Fatty Acids (FISH OIL PO) Take 1,400 mg by mouth daily.    . pantoprazole (PROTONIX) 40 MG tablet Take 40 mg by mouth every evening.     . pravastatin (PRAVACHOL) 20 MG tablet Take 20 mg  by mouth daily.    . risperiDONE (RISPERDAL) 1 MG tablet Take 1.5 tablets (1.5 mg total) by mouth daily. Taking 1.5 Tablets QD 135 tablet 2  . traZODone (DESYREL) 50 MG tablet Take 1 tablet (50 mg total) by mouth at bedtime. 90 tablet 2   No current facility-administered medications for this visit.   Allergies:  Bee venom and Codeine   Social History: The patient  reports that he quit smoking about 4 years ago. His smoking use included Cigarettes. He has a 50 pack-year smoking history. He has never used smokeless tobacco. He reports that he does not drink alcohol or use illicit drugs.   ROS:  Please see the history of present illness. Otherwise, complete review of systems is positive for arthritic symptoms.  All other systems are reviewed and negative.   Physical Exam: VS:  BP 122/82 mmHg  Pulse  64  Ht '5\' 8"'$  (1.727 m)  Wt 199 lb (90.266 kg)  BMI 30.26 kg/m2  SpO2 95%, BMI Body mass index is 30.26 kg/(m^2).  Wt Readings from Last 3 Encounters:  06/25/15 199 lb (90.266 kg)  05/14/15 188 lb 3.2 oz (85.367 kg)  01/15/15 183 lb 3.2 oz (83.099 kg)    No acute distress.  HEENT: Conjunctiva and lids normal, oropharynx clear.  Neck: Supple, no elevated JVP or carotid bruits, no thyromegaly.  Lungs: Clear to auscultation, nonlabored breathing at rest.  Cardiac: Regular rate and rhythm, no S3 or significant systolic murmur.  Abdomen: Soft, nontender,bowel sounds present.  Extremities: No pitting edema, distal pulses 2+.   ECG: ECG is ordered today.  Recent Labwork: 09/28/2014: ALT 30; AST 34; BUN 20; Creatinine, Ser 1.18; Hemoglobin 13.0; Platelets 311; Potassium 4.6; Sodium 135     Component Value Date/Time   CHOL 180 03/02/2012 0623   TRIG 140 03/02/2012 0623   HDL 32* 03/02/2012 0623   CHOLHDL 5.6 03/02/2012 0623   VLDL 28 03/02/2012 0623   LDLCALC 120* 03/02/2012 0623    Other Studies Reviewed Today:  Cardiac catheterization from September 2013 revealed a moderately tight proximal to mid LAD stenosis with continued patency of the LIMA graft, minimal stenosis in the left circumflex, continued patency of the stented segment in the native right coronary artery, continued patency of the saphenous vein graft to PLA as well as the stented segment in that vein graft, known total occlusion of the saphenous vein graft to PDA, mild segmental LV dysfunction with estimated left ventricular ejection fraction 45%.  Lexiscan Cardiolite 04/27/2014: FINDINGS: Perfusion: Mild inferior wall attenuation on both stress and rest acquisitions may represent scar. There also is felt to potentially be a component of diaphragmatic attenuation. There is no evidence to suggest inducible ischemia with Lexiscan administration.  Wall Motion: Normal left ventricular wall motion. No left ventricular  dilation.  Left Ventricular Ejection Fraction: 58 %  End diastolic volume 83 ml  End systolic volume 34 ml  IMPRESSION: 1. No evidence of inducible myocardial ischemia. Mild inferior wall attenuation may be a combination of potentially scar and diaphragmatic attenuation.  2. Normal left ventricular wall motion.  3. Left ventricular ejection fraction 58%  4. Low-risk stress test findings*.  Assessment and Plan:  1. Symptomatically stable CAD status post CABG with subsequent DES interventions to the SVG to PLA as well as native RCA. Stress testing from last year was low risk. I encouraged him to continue his regular exercise plan.  2. Essential hypertension, blood pressure is well controlled today. No changes made.  3. Hyperlipidemia, on Pravachol. Requesting most recent lab work from Dr. Manuella Ghazi.  Current medicines were reviewed with the patient today.   Orders Placed This Encounter  Procedures  . EKG 12-Lead    Disposition: FU with me in 1 year.   Signed, Satira Sark, MD, [facility] 06/25/2015 11:31 AM    [facility] [place], [address] [place], [place] [address] Phone: [phone]; Fax: [phone]

## 2015-06-25 NOTE — Patient Instructions (Signed)
Continue all current medications. Your physician wants you to follow up in:  1 year.  You will receive a reminder letter in the mail one-two months in advance.  If you don't receive a letter, please call our office to schedule the follow up appointment   

## 2015-09-10 ENCOUNTER — Ambulatory Visit (HOSPITAL_COMMUNITY): Payer: Self-pay | Admitting: Psychiatry

## 2015-09-24 ENCOUNTER — Ambulatory Visit (HOSPITAL_COMMUNITY): Payer: Self-pay | Admitting: Psychiatry

## 2016-04-13 ENCOUNTER — Emergency Department (HOSPITAL_COMMUNITY): Payer: Medicare Other

## 2016-04-13 ENCOUNTER — Encounter (HOSPITAL_COMMUNITY): Payer: Self-pay | Admitting: Emergency Medicine

## 2016-04-13 DIAGNOSIS — I25710 Atherosclerosis of autologous vein coronary artery bypass graft(s) with unstable angina pectoris: Secondary | ICD-10-CM | POA: Diagnosis not present

## 2016-04-13 DIAGNOSIS — J449 Chronic obstructive pulmonary disease, unspecified: Secondary | ICD-10-CM | POA: Insufficient documentation

## 2016-04-13 DIAGNOSIS — M549 Dorsalgia, unspecified: Secondary | ICD-10-CM | POA: Diagnosis not present

## 2016-04-13 DIAGNOSIS — E785 Hyperlipidemia, unspecified: Secondary | ICD-10-CM | POA: Insufficient documentation

## 2016-04-13 DIAGNOSIS — E1140 Type 2 diabetes mellitus with diabetic neuropathy, unspecified: Secondary | ICD-10-CM | POA: Insufficient documentation

## 2016-04-13 DIAGNOSIS — Z7982 Long term (current) use of aspirin: Secondary | ICD-10-CM | POA: Insufficient documentation

## 2016-04-13 DIAGNOSIS — M1990 Unspecified osteoarthritis, unspecified site: Secondary | ICD-10-CM | POA: Insufficient documentation

## 2016-04-13 DIAGNOSIS — F419 Anxiety disorder, unspecified: Secondary | ICD-10-CM | POA: Insufficient documentation

## 2016-04-13 DIAGNOSIS — K219 Gastro-esophageal reflux disease without esophagitis: Secondary | ICD-10-CM | POA: Diagnosis not present

## 2016-04-13 DIAGNOSIS — I959 Hypotension, unspecified: Secondary | ICD-10-CM | POA: Insufficient documentation

## 2016-04-13 DIAGNOSIS — F332 Major depressive disorder, recurrent severe without psychotic features: Secondary | ICD-10-CM | POA: Diagnosis not present

## 2016-04-13 DIAGNOSIS — Z7984 Long term (current) use of oral hypoglycemic drugs: Secondary | ICD-10-CM | POA: Insufficient documentation

## 2016-04-13 DIAGNOSIS — Z87891 Personal history of nicotine dependence: Secondary | ICD-10-CM | POA: Diagnosis not present

## 2016-04-13 DIAGNOSIS — I2582 Chronic total occlusion of coronary artery: Secondary | ICD-10-CM | POA: Diagnosis not present

## 2016-04-13 DIAGNOSIS — Z955 Presence of coronary angioplasty implant and graft: Secondary | ICD-10-CM | POA: Diagnosis not present

## 2016-04-13 DIAGNOSIS — R079 Chest pain, unspecified: Secondary | ICD-10-CM | POA: Diagnosis present

## 2016-04-13 DIAGNOSIS — I4510 Unspecified right bundle-branch block: Secondary | ICD-10-CM | POA: Insufficient documentation

## 2016-04-13 DIAGNOSIS — F0390 Unspecified dementia without behavioral disturbance: Secondary | ICD-10-CM | POA: Insufficient documentation

## 2016-04-13 DIAGNOSIS — I10 Essential (primary) hypertension: Secondary | ICD-10-CM | POA: Diagnosis not present

## 2016-04-13 DIAGNOSIS — Z7902 Long term (current) use of antithrombotics/antiplatelets: Secondary | ICD-10-CM | POA: Diagnosis not present

## 2016-04-13 LAB — CBC
HCT: 38.2 % — ABNORMAL LOW (ref 39.0–52.0)
HEMOGLOBIN: 12.6 g/dL — AB (ref 13.0–17.0)
MCH: 29.6 pg (ref 26.0–34.0)
MCHC: 33 g/dL (ref 30.0–36.0)
MCV: 89.7 fL (ref 78.0–100.0)
Platelets: 278 10*3/uL (ref 150–400)
RBC: 4.26 MIL/uL (ref 4.22–5.81)
RDW: 13.1 % (ref 11.5–15.5)
WBC: 8.6 10*3/uL (ref 4.0–10.5)

## 2016-04-13 LAB — BASIC METABOLIC PANEL
ANION GAP: 9 (ref 5–15)
BUN: 17 mg/dL (ref 6–20)
CALCIUM: 9.9 mg/dL (ref 8.9–10.3)
CO2: 28 mmol/L (ref 22–32)
Chloride: 100 mmol/L — ABNORMAL LOW (ref 101–111)
Creatinine, Ser: 1.07 mg/dL (ref 0.61–1.24)
GLUCOSE: 94 mg/dL (ref 65–99)
Potassium: 4.3 mmol/L (ref 3.5–5.1)
SODIUM: 137 mmol/L (ref 135–145)

## 2016-04-13 LAB — I-STAT TROPONIN, ED: TROPONIN I, POC: 0.01 ng/mL (ref 0.00–0.08)

## 2016-04-13 NOTE — ED Triage Notes (Signed)
Pt. reports intermittent left chest pain with SOB , nausea and dry cough onset this afternoon , pt. took 2 NTG sl prior to arrival with slight relief . His cardiologist is Dr. Domenic Polite , history of CAD /CABG/Coronary stents.

## 2016-04-14 ENCOUNTER — Encounter (HOSPITAL_COMMUNITY): Payer: Self-pay | Admitting: *Deleted

## 2016-04-14 ENCOUNTER — Observation Stay (HOSPITAL_COMMUNITY)
Admission: EM | Admit: 2016-04-14 | Discharge: 2016-04-16 | Disposition: A | Discharge status: Home / Self Care | Payer: Medicare Other | Attending: Internal Medicine | Admitting: Internal Medicine

## 2016-04-14 ENCOUNTER — Observation Stay (HOSPITAL_BASED_OUTPATIENT_CLINIC_OR_DEPARTMENT_OTHER): Payer: Medicare Other

## 2016-04-14 ENCOUNTER — Observation Stay (HOSPITAL_COMMUNITY): Payer: Medicare Other

## 2016-04-14 DIAGNOSIS — I2510 Atherosclerotic heart disease of native coronary artery without angina pectoris: Secondary | ICD-10-CM | POA: Diagnosis not present

## 2016-04-14 DIAGNOSIS — I10 Essential (primary) hypertension: Secondary | ICD-10-CM | POA: Diagnosis present

## 2016-04-14 DIAGNOSIS — E118 Type 2 diabetes mellitus with unspecified complications: Secondary | ICD-10-CM

## 2016-04-14 DIAGNOSIS — E119 Type 2 diabetes mellitus without complications: Secondary | ICD-10-CM

## 2016-04-14 DIAGNOSIS — I200 Unstable angina: Secondary | ICD-10-CM | POA: Diagnosis present

## 2016-04-14 DIAGNOSIS — J449 Chronic obstructive pulmonary disease, unspecified: Secondary | ICD-10-CM | POA: Diagnosis not present

## 2016-04-14 DIAGNOSIS — F0390 Unspecified dementia without behavioral disturbance: Secondary | ICD-10-CM | POA: Diagnosis present

## 2016-04-14 DIAGNOSIS — F332 Major depressive disorder, recurrent severe without psychotic features: Secondary | ICD-10-CM | POA: Diagnosis present

## 2016-04-14 DIAGNOSIS — K219 Gastro-esophageal reflux disease without esophagitis: Secondary | ICD-10-CM | POA: Diagnosis present

## 2016-04-14 DIAGNOSIS — R079 Chest pain, unspecified: Secondary | ICD-10-CM

## 2016-04-14 DIAGNOSIS — I110 Hypertensive heart disease with heart failure: Secondary | ICD-10-CM

## 2016-04-14 DIAGNOSIS — E785 Hyperlipidemia, unspecified: Secondary | ICD-10-CM | POA: Diagnosis present

## 2016-04-14 DIAGNOSIS — Z951 Presence of aortocoronary bypass graft: Secondary | ICD-10-CM

## 2016-04-14 LAB — CBG MONITORING, ED
Glucose-Capillary: 155 mg/dL — ABNORMAL HIGH (ref 65–99)
Glucose-Capillary: 164 mg/dL — ABNORMAL HIGH (ref 65–99)

## 2016-04-14 LAB — TROPONIN I: Troponin I: <0.03 ng/mL (ref <0.03)

## 2016-04-14 LAB — NM MYOCAR MULTI W/SPECT W/WALL MOTION / EF
CHL CUP MPHR: 157 {beats}/min
CHL CUP RESTING HR STRESS: 63 {beats}/min
CSEPED: 5 min
Estimated workload: 1 METS
Peak HR: 90 {beats}/min
Percent HR: 57 %

## 2016-04-14 LAB — HEPARIN LEVEL (UNFRACTIONATED): Heparin Unfractionated: 0.59 IU/mL (ref 0.30–0.70)

## 2016-04-14 LAB — LIPID PANEL
CHOL/HDL RATIO: 4.1 ratio
Cholesterol: 107 mg/dL (ref 0–200)
HDL: 26 mg/dL — ABNORMAL LOW (ref >40)
LDL Cholesterol: 52 mg/dL (ref 0–99)
Triglycerides: 144 mg/dL (ref <150)
VLDL: 29 mg/dL (ref 0–40)

## 2016-04-14 LAB — GLUCOSE, CAPILLARY
GLUCOSE-CAPILLARY: 237 mg/dL — AB (ref 65–99)
Glucose-Capillary: 104 mg/dL — ABNORMAL HIGH (ref 65–99)
Glucose-Capillary: 153 mg/dL — ABNORMAL HIGH (ref 65–99)

## 2016-04-14 LAB — MRSA PCR SCREENING: MRSA BY PCR: NEGATIVE

## 2016-04-14 LAB — BRAIN NATRIURETIC PEPTIDE: B NATRIURETIC PEPTIDE 5: 35.3 pg/mL (ref 0.0–100.0)

## 2016-04-14 MED ORDER — TRAZODONE HCL 50 MG PO TABS
50.0000 mg | ORAL_TABLET | Freq: Every day | ORAL | Status: DC
  Administered 2016-04-14 – 2016-04-15 (×2): 50 mg via ORAL
  Filled 2016-04-14 (×2): qty 1

## 2016-04-14 MED ORDER — SODIUM CHLORIDE 0.9 % IV SOLN: 250.0000 mL | INTRAVENOUS | Status: DC | PRN

## 2016-04-14 MED ORDER — LISINOPRIL 2.5 MG PO TABS
2.5000 mg | ORAL_TABLET | Freq: Every day | ORAL | Status: DC
  Administered 2016-04-14 – 2016-04-16 (×3): 2.5 mg via ORAL
  Filled 2016-04-14 (×3): qty 1

## 2016-04-14 MED ORDER — INSULIN ASPART 100 UNIT/ML ~~LOC~~ SOLN
0.0000 [IU] | SUBCUTANEOUS | Status: DC
Start: 2016-04-14 — End: 2016-04-14
  Administered 2016-04-14: 04:00:00 via SUBCUTANEOUS
  Filled 2016-04-14: qty 1

## 2016-04-14 MED ORDER — SODIUM CHLORIDE 0.9% FLUSH: 3.0000 mL | INTRAVENOUS | Status: DC | PRN

## 2016-04-14 MED ORDER — PRAVASTATIN SODIUM 20 MG PO TABS
20.0000 mg | ORAL_TABLET | Freq: Every day | ORAL | Status: DC
  Administered 2016-04-14 – 2016-04-15 (×2): 20 mg via ORAL
  Filled 2016-04-14 (×2): qty 1

## 2016-04-14 MED ORDER — ASPIRIN 81 MG PO CHEW
81.0000 mg | CHEWABLE_TABLET | ORAL | Status: AC
  Administered 2016-04-15: 81 mg via ORAL
  Filled 2016-04-14: qty 1

## 2016-04-14 MED ORDER — REGADENOSON 0.4 MG/5ML IV SOLN
INTRAVENOUS | Status: AC
  Administered 2016-04-14: 0.4 mg via INTRAVENOUS
  Filled 2016-04-14: qty 5

## 2016-04-14 MED ORDER — GABAPENTIN 100 MG PO CAPS
100.0000 mg | ORAL_CAPSULE | Freq: Three times a day (TID) | ORAL | Status: DC
  Administered 2016-04-14 – 2016-04-16 (×6): 100 mg via ORAL
  Filled 2016-04-14 (×7): qty 1

## 2016-04-14 MED ORDER — SODIUM CHLORIDE 0.9% FLUSH
3.0000 mL | Freq: Two times a day (BID) | INTRAVENOUS | Status: DC
  Administered 2016-04-15: 3 mL via INTRAVENOUS

## 2016-04-14 MED ORDER — METOPROLOL TARTRATE 25 MG PO TABS
25.0000 mg | ORAL_TABLET | Freq: Two times a day (BID) | ORAL | Status: DC
  Administered 2016-04-14 – 2016-04-16 (×4): 25 mg via ORAL
  Filled 2016-04-14 (×4): qty 1

## 2016-04-14 MED ORDER — MORPHINE SULFATE (PF) 2 MG/ML IV SOLN
2.0000 mg | INTRAVENOUS | Status: DC | PRN
  Administered 2016-04-14 (×3): 2 mg via INTRAVENOUS
  Filled 2016-04-14 (×3): qty 1

## 2016-04-14 MED ORDER — TECHNETIUM TC 99M TETROFOSMIN IV KIT
30.0000 | PACK | Freq: Once | INTRAVENOUS | Status: AC | PRN
  Administered 2016-04-14: 30 via INTRAVENOUS

## 2016-04-14 MED ORDER — NITROGLYCERIN 0.4 MG SL SUBL
0.4000 mg | SUBLINGUAL_TABLET | SUBLINGUAL | Status: AC | PRN
  Administered 2016-04-14 (×3): 0.4 mg via SUBLINGUAL
  Filled 2016-04-14: qty 1

## 2016-04-14 MED ORDER — ASPIRIN EC 81 MG PO TBEC
81.0000 mg | DELAYED_RELEASE_TABLET | Freq: Every day | ORAL | Status: DC
  Administered 2016-04-14 – 2016-04-16 (×2): 81 mg via ORAL
  Filled 2016-04-14 (×3): qty 1

## 2016-04-14 MED ORDER — DONEPEZIL HCL 5 MG PO TABS
5.0000 mg | ORAL_TABLET | Freq: Every day | ORAL | Status: DC
  Administered 2016-04-14 – 2016-04-15 (×2): 5 mg via ORAL
  Filled 2016-04-14 (×2): qty 1

## 2016-04-14 MED ORDER — NITROGLYCERIN IN D5W 200-5 MCG/ML-% IV SOLN
0.0000 ug/min | Freq: Once | INTRAVENOUS | Status: AC
  Administered 2016-04-14: 5 ug/min via INTRAVENOUS
  Filled 2016-04-14: qty 250

## 2016-04-14 MED ORDER — INFLUENZA VAC SPLIT QUAD 0.5 ML IM SUSY: 0.5000 mL | PREFILLED_SYRINGE | INTRAMUSCULAR | Status: DC

## 2016-04-14 MED ORDER — ENOXAPARIN SODIUM 40 MG/0.4ML ~~LOC~~ SOLN
40.0000 mg | SUBCUTANEOUS | Status: DC
  Administered 2016-04-14: 40 mg via SUBCUTANEOUS
  Filled 2016-04-14 (×2): qty 0.4

## 2016-04-14 MED ORDER — KETOROLAC TROMETHAMINE 15 MG/ML IJ SOLN
15.0000 mg | Freq: Once | INTRAMUSCULAR | Status: AC
  Administered 2016-04-14: 15 mg via INTRAVENOUS
  Filled 2016-04-14: qty 1

## 2016-04-14 MED ORDER — TECHNETIUM TC 99M TETROFOSMIN IV KIT
10.0000 | PACK | Freq: Once | INTRAVENOUS | Status: AC | PRN
  Administered 2016-04-14: 10 via INTRAVENOUS

## 2016-04-14 MED ORDER — SODIUM CHLORIDE 0.9 % WEIGHT BASED INFUSION
3.0000 mL/kg/h | INTRAVENOUS | Status: DC
  Administered 2016-04-15: 3 mL/kg/h via INTRAVENOUS

## 2016-04-14 MED ORDER — ACETAMINOPHEN 325 MG PO TABS: 650.0000 mg | ORAL_TABLET | ORAL | Status: DC | PRN

## 2016-04-14 MED ORDER — PNEUMOCOCCAL VAC POLYVALENT 25 MCG/0.5ML IJ INJ
0.5000 mL | INJECTION | INTRAMUSCULAR | Status: DC
  Filled 2016-04-14: qty 0.5

## 2016-04-14 MED ORDER — ASPIRIN 81 MG PO CHEW
324.0000 mg | CHEWABLE_TABLET | Freq: Once | ORAL | Status: AC
  Administered 2016-04-14: 324 mg via ORAL
  Filled 2016-04-14: qty 4

## 2016-04-14 MED ORDER — CLOPIDOGREL BISULFATE 75 MG PO TABS
75.0000 mg | ORAL_TABLET | Freq: Every day | ORAL | Status: DC
  Administered 2016-04-14 – 2016-04-16 (×3): 75 mg via ORAL
  Filled 2016-04-14 (×3): qty 1

## 2016-04-14 MED ORDER — NITROGLYCERIN 2 % TD OINT
0.5000 [in_us] | TOPICAL_OINTMENT | Freq: Once | TRANSDERMAL | Status: AC
  Administered 2016-04-14: 0.5 [in_us] via TOPICAL
  Filled 2016-04-14: qty 1

## 2016-04-14 MED ORDER — ONDANSETRON HCL 4 MG/2ML IJ SOLN: 4.0000 mg | Freq: Four times a day (QID) | INTRAMUSCULAR | Status: DC | PRN

## 2016-04-14 MED ORDER — MORPHINE SULFATE (PF) 2 MG/ML IV SOLN
2.0000 mg | Freq: Once | INTRAVENOUS | Status: AC
  Administered 2016-04-14: 2 mg via INTRAVENOUS
  Filled 2016-04-14: qty 1

## 2016-04-14 MED ORDER — NITROGLYCERIN IN D5W 200-5 MCG/ML-% IV SOLN: 5.0000 ug/min | INTRAVENOUS | Status: DC

## 2016-04-14 MED ORDER — ENOXAPARIN SODIUM 40 MG/0.4ML ~~LOC~~ SOLN: 40.0000 mg | Freq: Every day | SUBCUTANEOUS | Status: DC

## 2016-04-14 MED ORDER — HEPARIN BOLUS VIA INFUSION
4000.0000 [IU] | Freq: Once | INTRAVENOUS | Status: AC
  Administered 2016-04-14: 4000 [IU] via INTRAVENOUS
  Filled 2016-04-14: qty 4000

## 2016-04-14 MED ORDER — HEPARIN (PORCINE) IN NACL 100-0.45 UNIT/ML-% IJ SOLN
1000.0000 [IU]/h | INTRAMUSCULAR | Status: DC
  Administered 2016-04-14: 1000 [IU]/h via INTRAVENOUS
  Filled 2016-04-14: qty 250

## 2016-04-14 MED ORDER — ADULT MULTIVITAMIN W/MINERALS CH
1.0000 | ORAL_TABLET | Freq: Every day | ORAL | Status: DC
  Administered 2016-04-14 – 2016-04-16 (×3): 1 via ORAL
  Filled 2016-04-14 (×3): qty 1

## 2016-04-14 MED ORDER — RISPERIDONE 1 MG PO TABS
1.5000 mg | ORAL_TABLET | Freq: Every day | ORAL | Status: DC
  Administered 2016-04-14 – 2016-04-16 (×3): 1.5 mg via ORAL
  Filled 2016-04-14 (×3): qty 1

## 2016-04-14 MED ORDER — BUPROPION HCL ER (SR) 150 MG PO TB12
150.0000 mg | ORAL_TABLET | Freq: Two times a day (BID) | ORAL | Status: DC
  Administered 2016-04-14 – 2016-04-16 (×5): 150 mg via ORAL
  Filled 2016-04-14 (×5): qty 1

## 2016-04-14 MED ORDER — SODIUM CHLORIDE 0.9 % WEIGHT BASED INFUSION: 1.0000 mL/kg/h | INTRAVENOUS | Status: DC

## 2016-04-14 MED ORDER — INSULIN ASPART 100 UNIT/ML ~~LOC~~ SOLN
0.0000 [IU] | SUBCUTANEOUS | Status: DC
  Administered 2016-04-14: 2 [IU] via SUBCUTANEOUS
  Administered 2016-04-14: 3 [IU] via SUBCUTANEOUS
  Administered 2016-04-14: 2 [IU] via SUBCUTANEOUS
  Administered 2016-04-15 (×2): 1 [IU] via SUBCUTANEOUS
  Administered 2016-04-15: 2 [IU] via SUBCUTANEOUS
  Administered 2016-04-15: 1 [IU] via SUBCUTANEOUS
  Administered 2016-04-16 (×3): 2 [IU] via SUBCUTANEOUS

## 2016-04-14 MED ORDER — ENOXAPARIN SODIUM 40 MG/0.4ML ~~LOC~~ SOLN: 40.0000 mg | SUBCUTANEOUS | Status: DC

## 2016-04-14 MED ORDER — REGADENOSON 0.4 MG/5ML IV SOLN
0.4000 mg | Freq: Once | INTRAVENOUS | Status: AC
  Administered 2016-04-14: 0.4 mg via INTRAVENOUS
  Filled 2016-04-14: qty 5

## 2016-04-14 MED ORDER — PANTOPRAZOLE SODIUM 40 MG PO TBEC
40.0000 mg | DELAYED_RELEASE_TABLET | Freq: Every evening | ORAL | Status: DC
  Administered 2016-04-14 – 2016-04-15 (×2): 40 mg via ORAL
  Filled 2016-04-14 (×2): qty 1

## 2016-04-14 NOTE — ED Provider Notes (Signed)
[facility] DEPT Provider Note   CSN: [id number] Arrival date & time: 04/13/16  2036  By signing my name below, I, Reola Mosher, attest that this documentation has been prepared under the direction and in the presence of Ripley Fraise, MD. Electronically Signed: Reola Mosher, ED Scribe. 04/14/16. 1:22 AM.  History   Chief Complaint Chief Complaint  Patient presents with  . Chest Pain   The history is provided by the patient and medical records. No language interpreter was used.  Chest Pain   This is a new problem. The current episode started 6 to 12 hours ago. The problem occurs constantly. The problem has been gradually improving. The pain is associated with breathing. The pain is present in the substernal region, lateral region and epigastric region. The pain is mild. The quality of the pain is described as heavy. The symptoms are aggravated by deep breathing. Associated symptoms include abdominal pain, back pain, cough, nausea, numbness (left fingertips) and shortness of breath. Pertinent negatives include no diaphoresis, no hemoptysis, no lower extremity edema, no near-syncope, no syncope, no vomiting and no weakness. He has tried nitroglycerin for the symptoms. The treatment provided moderate relief. Risk factors include male gender.  His past medical history is significant for CAD and diabetes.  Pertinent negatives for past medical history include no DVT and no PE.   HPI Comments: [name] is a 65 y.o. male with a PMHx of CAD s/p CABG and w/ stent placement, and who presents to the Emergency Department complaining of intermittent episodes diffuse chest pain onset approximately 8 hours ago. Pt states that he is only in mild pain while in the ED currently. He describes his pain as heaviness and as like someone is sitting on his chest. Pt reports associated SOB, nausea, dry cough, lightheadedness, mild upper abdominal pain, and subjective left fingertip numbness  secondary to his chest pain. Pt additionally states that he is having back pain; however, states that he has a hx of disc disease and is unsure if it is related to his chest pain. Pt reports that the last time he felt similar chest pain to today was when he was ~1 month s/p open heart surgery. Pt took 2 doses of NTG prior to coming into the ED with moderate relief of his pain. His pain is exacerbated and will shoot up his back with deep inspirations. No Hx of PE/DVT. Denies diaphoresis, syncope, emesis, leg swelling, focal weakness, diaphoresis, or any other associated symptoms.   Cardiologist: Dr Rozann Lesches  Past Medical History:  Diagnosis Date  . Anxiety   . Arthritis   . CAD (coronary artery disease)    a. s/p CABG, DES to SVG to PLA, DES to RCA  b. low risk nuclear stress test 04/2014  . COPD (chronic obstructive pulmonary disease) ([place])   . Dementia   . Depression   . Dyslipidemia   . Essential hypertension, benign   . GERD (gastroesophageal reflux disease)   . History of Bell's palsy   . Type 2 diabetes mellitus [facility])    Patient Active Problem List   Diagnosis Date Noted  . Major depression 12/10/2014  . Major depressive disorder, recurrent, severe without psychotic behavior ([place]) 09/29/2014  . Aggressive behavior of adult   . Homicidal ideation   . CAD (coronary artery disease)   . Type 2 diabetes mellitus ([place])   . Dementia   . Anxiety   . GERD (gastroesophageal reflux disease)   . Preoperative cardiovascular examination 05/01/2013  .  Memory loss 10/04/2012  . Status post coronary artery bypass grafting 01/27/2011  . Essential hypertension, benign 01/27/2011  . COPD (chronic obstructive pulmonary disease) ([place]) 01/27/2011  . Dyslipidemia 01/27/2011   Past Surgical History:  Procedure Laterality Date  . APPENDECTOMY    . CHOLECYSTECTOMY    . CORONARY ARTERY BYPASS GRAFT  01/01/2011   LIMA to LAD, SVG to PLA, SVG to PDA  . HAMMER TOE SURGERY Bilateral   . KNEE  ARTHROSCOPY Left   . LEFT HEART CATHETERIZATION WITH CORONARY/GRAFT ANGIOGRAM N/A 03/01/2012   Procedure: LEFT HEART CATHETERIZATION WITH Beatrix Fetters;  Surgeon: Sherren Mocha, MD;  Location: [facility] CATH LAB;  Service: Cardiovascular;  Laterality: N/A;  . ROTATOR CUFF REPAIR Bilateral   . SHOULDER SURGERY     Bilateral  . TONSILLECTOMY     tubes  . TYMPANOMASTOIDECTOMY Right 05/20/2013   Procedure: RIGHT CANAL UP TYMPANOMASTOIDECTOMY WITH TYPE I TYMPANOPLASTY AND INSERTION OF A TUBE;  Surgeon: Fannie Knee, MD;  Location: [facility];  Service: ENT;  Laterality: Right;    Home Medications    Prior to Admission medications   Medication Sig Start Date End Date Taking? Authorizing Provider  acetaminophen (TYLENOL) 500 MG tablet Take 1,000 mg by mouth every 6 (six) hours as needed. (for chest wall pain) 03/29/11   Ezra Sites, MD  Ascorbic Acid (VITAMIN C PO) Take 1 tablet by mouth daily.    Historical Provider, MD  aspirin 81 MG EC tablet Take 81 mg by mouth daily.      Historical Provider, MD  buPROPion (WELLBUTRIN SR) 150 MG 12 hr tablet Take 1 tablet (150 mg total) by mouth 2 (two) times daily. 05/14/15   Cloria Spring, MD  clopidogrel (PLAVIX) 75 MG tablet Take 75 mg by mouth daily.    Historical Provider, MD  donepezil (ARICEPT) 5 MG tablet Take 1 tablet (5 mg total) by mouth at bedtime. 05/14/15   Cloria Spring, MD  gabapentin (NEURONTIN) 100 MG capsule Take 100 mg by mouth 3 (three) times daily.     Historical Provider, MD  glimepiride (AMARYL) 4 MG tablet Take 4 mg by mouth daily with breakfast.    Historical Provider, MD  lisinopril (PRINIVIL,ZESTRIL) 2.5 MG tablet Take 2.5 mg by mouth daily.    Historical Provider, MD  metFORMIN (GLUCOPHAGE) 1000 MG tablet Take 1,000 mg by mouth 2 (two) times daily with a meal.    Historical Provider, MD  metoprolol tartrate (LOPRESSOR) 25 MG tablet Take 25 mg by mouth 2 (two) times daily.    Historical Provider, MD  Multiple Vitamins-Minerals  (MULTIVITAMIN MEN PO) Take by mouth daily.    Historical Provider, MD  Multiple Vitamins-Minerals (ZINC PO) Take by mouth daily.    Historical Provider, MD  Multiple Vitamins-Minerals (ZINC PO) Take by mouth as needed.    Historical Provider, MD  nitroGLYCERIN (NITROSTAT) 0.4 MG SL tablet Place 1 tablet (0.4 mg total) under the tongue every 5 (five) minutes as needed. For chest pain 04/27/14   Eileen Stanford, PA-C  Omega-3 Fatty Acids (FISH OIL PO) Take 1,400 mg by mouth daily.    Historical Provider, MD  pantoprazole (PROTONIX) 40 MG tablet Take 40 mg by mouth every evening.     Historical Provider, MD  pravastatin (PRAVACHOL) 20 MG tablet Take 20 mg by mouth daily.    Historical Provider, MD  risperiDONE (RISPERDAL) 1 MG tablet Take 1.5 tablets (1.5 mg total) by mouth daily. Taking 1.5 Tablets QD  05/14/15   Cloria Spring, MD  traZODone (DESYREL) 50 MG tablet Take 1 tablet (50 mg total) by mouth at bedtime. 05/14/15   Cloria Spring, MD    Family History Family History  Problem Relation Age of Onset  . Diabetes    . Hypertension    . Alcohol abuse Brother     Social History Social History  Substance Use Topics  . Smoking status: Former Smoker    Packs/day: 1.00    Years: 50.00    Types: Cigarettes    Quit date: 12/26/2010  . Smokeless tobacco: Never Used  . Alcohol use No     Comment: drinks alcohol occasionally     Allergies   Bee venom and Codeine  Review of Systems Review of Systems  Constitutional: Negative for diaphoresis.  Respiratory: Positive for cough and shortness of breath. Negative for hemoptysis.   Cardiovascular: Positive for chest pain. Negative for leg swelling, syncope and near-syncope.  Gastrointestinal: Positive for abdominal pain and nausea. Negative for vomiting.  Musculoskeletal: Positive for back pain.  Neurological: Positive for light-headedness and numbness (left fingertips). Negative for syncope and weakness.  All other systems reviewed and are  negative.  Physical Exam Updated Vital Signs BP 110/84 (BP Location: Left Arm)   Pulse 62   Temp 98.6 F (37 C) (Oral)   Resp 16   Ht '5\' 8"'$  (1.727 m)   Wt 185 lb (83.9 kg)   SpO2 99%   BMI 28.13 kg/m   Physical Exam CONSTITUTIONAL: Well developed/well nourished HEAD: Normocephalic/atraumatic EYES: EOMI/PERRL ENMT: Mucous membranes moist NECK: supple no meningeal signs SPINE/BACK:entire spine nontender CV: S1/S2 noted, no murmurs/rubs/gallops noted LUNGS: Lungs are clear to auscultation bilaterally, no apparent distress ABDOMEN: soft, nontender, no rebound or guarding, bowel sounds noted throughout abdomen GU:no cva tenderness NEURO: Pt is awake/alert/appropriate, moves all extremitiesx4.  No facial droop.   EXTREMITIES: pulses normal/equal, full ROM SKIN: warm, color normal PSYCH: flat affect   ED Treatments / Results  DIAGNOSTIC STUDIES: Oxygen Saturation is 99% on RA, normal by my interpretation.   COORDINATION OF CARE: 1:07 AM-Discussed next steps with pt. Pt verbalized understanding and is agreeable with the plan.   Labs (all labs ordered are listed, but only abnormal results are displayed) Labs Reviewed  BASIC METABOLIC PANEL - Abnormal; Notable for the following:       Result Value   Chloride 100 (*)    All other components within normal limits  CBC - Abnormal; Notable for the following:    Hemoglobin 12.6 (*)    HCT 38.2 (*)    All other components within normal limits  TROPONIN I  TROPONIN I  TROPONIN I  I-STAT TROPOININ, ED   EKG  EKG Interpretation  Date/Time:  Wednesday April 13 2016 20:43:28 EDT Ventricular Rate:  59 PR Interval:  166 QRS Duration: 194 QT Interval:  488 QTC Calculation: 483 R Axis:   -105 Text Interpretation:  Sinus bradycardia Right bundle branch block Abnormal ECG No significant change since last tracing Confirmed by Christy Gentles  MD, Bruce ([id number]) on 04/14/2016 1:05:22 AM      Radiology Dg Chest 2 View  Result  Date: 04/13/2016 CLINICAL DATA:  Shortness of breath and chest pain EXAM: CHEST  2 VIEW COMPARISON:  April 26, 2014 FINDINGS: There is no edema or consolidation. The heart size and pulmonary vascularity are normal. No adenopathy. Patient is status post coronary artery bypass grafting. No pneumothorax. There is evidence of previous erosion of the  lateral right clavicle with acromioclavicular separation on the right, stable. IMPRESSION: No edema or consolidation. Electronically Signed   By: Lowella Grip III M.D.   On: 04/13/2016 21:58   Procedures Procedures   Medications Ordered in ED Medications  aspirin chewable tablet 324 mg (324 mg Oral Given 04/14/16 0116)  nitroGLYCERIN (NITROSTAT) SL tablet 0.4 mg (0.4 mg Sublingual Given 04/14/16 0126)    Initial Impression / Assessment and Plan / ED Course  I have reviewed the triage vital signs and the nursing notes.  Pertinent labs & imaging results that were available during my care of the patient were reviewed by me and considered in my medical decision making (see chart for details).  Clinical Course   Pt stable He is improved with ASA/NTG, reports CP is improved He reports this pain is similar to prior episodes of CP with previous angina Will admit D/w dr gardner for admission I doubt PE at this time (no hypoxia, no significant tachycardia) and he is well appearing so aortic dissection is unlikely   Final Clinical Impressions(s) / ED Diagnoses   Final diagnoses:  Chest pain, rule out acute myocardial infarction    New Prescriptions New Prescriptions   No medications on file   I personally performed the services described in this documentation, which was scribed in my presence. The recorded information has been reviewed and is accurate.         Ripley Fraise, MD 04/14/16 [phone]

## 2016-04-14 NOTE — ED Notes (Signed)
Dr. Rae Halsted notified on pt.'s persistent central chest pressure/heaviness.

## 2016-04-14 NOTE — Progress Notes (Signed)
ANTICOAGULATION CONSULT NOTE - Initial Consult  Pharmacy Consult for heparin Indication: chest pain/ACS  Allergies  Allergen Reactions  . Bee Venom Anaphylaxis  . Codeine Shortness Of Breath and Other (See Comments)    Patient Measurements: Height: '5\' 8"'$  (172.7 cm) Weight: 180 lb 1.9 oz (81.7 kg) IBW/kg (Calculated) : 68.4 Heparin Dosing Weight: 81.7 kg  Vital Signs: Temp: 97.7 F (36.5 C) (10/19 1400) Temp Source: Oral (10/19 1400) BP: 120/70 (10/19 1400) Pulse Rate: 74 (10/19 1400)  Labs:  Recent Labs  04/13/16 2106 04/14/16 0326 04/14/16 0905 04/14/16 1416  HGB 12.6*  --   --   --   HCT 38.2*  --   --   --   PLT 278  --   --   --   CREATININE 1.07  --   --   --   TROPONINI  --  <0.03 <0.03 <0.03    Estimated Creatinine Clearance: 68.4 mL/min (by C-G formula based on SCr of 1.07 mg/dL).   Medical History: Past Medical History:  Diagnosis Date  . Anxiety   . Arthritis   . CAD (coronary artery disease)    a. s/p CABG, DES to SVG to PLA, DES to RCA  b. low risk nuclear stress test 04/2014  . COPD (chronic obstructive pulmonary disease) ([place])   . Dementia   . Depression   . Dyslipidemia   . Essential hypertension, benign   . GERD (gastroesophageal reflux disease)   . History of Bell's palsy   . Type 2 diabetes mellitus ([facility])     Medications:  Prescriptions Prior to Admission  Medication Sig Dispense Refill Last Dose  . Ascorbic Acid (VITAMIN C PO) Take 1 tablet by mouth daily.   04/13/2016 at Unknown time  . aspirin 81 MG EC tablet Take 81 mg by mouth daily.     04/13/2016 at Unknown time  . Biotin 1000 MCG tablet Take 1,000 mcg by mouth daily.   04/13/2016 at Unknown time  . buPROPion (WELLBUTRIN SR) 150 MG 12 hr tablet Take 1 tablet (150 mg total) by mouth 2 (two) times daily. 180 tablet 2 04/13/2016 at Unknown time  . clopidogrel (PLAVIX) 75 MG tablet Take 75 mg by mouth daily.   04/13/2016 at Unknown time  . donepezil (ARICEPT) 5 MG tablet Take 1  tablet (5 mg total) by mouth at bedtime. 90 tablet 2 04/12/2016 at Unknown time  . gabapentin (NEURONTIN) 100 MG capsule Take 100 mg by mouth 3 (three) times daily.    04/13/2016 at Unknown time  . glimepiride (AMARYL) 4 MG tablet Take 4 mg by mouth daily with breakfast.   04/13/2016 at Unknown time  . lisinopril (PRINIVIL,ZESTRIL) 2.5 MG tablet Take 2.5 mg by mouth daily.   04/13/2016 at Unknown time  . metFORMIN (GLUCOPHAGE) 1000 MG tablet Take 1,000 mg by mouth 2 (two) times daily with a meal.   04/13/2016 at Unknown time  . metoprolol tartrate (LOPRESSOR) 25 MG tablet Take 25 mg by mouth 2 (two) times daily.   04/13/2016 at 0800  . Multiple Vitamin (MULTIVITAMIN WITH MINERALS) TABS tablet Take 1 tablet by mouth daily.   04/13/2016 at Unknown time  . nitroGLYCERIN (NITROSTAT) 0.4 MG SL tablet Place 1 tablet (0.4 mg total) under the tongue every 5 (five) minutes as needed. For chest pain 25 tablet 12 04/13/2016 at Unknown time  . Omega-3 Fatty Acids (FISH OIL PO) Take 1,400 mg by mouth daily.   04/13/2016 at Unknown time  . pantoprazole (  PROTONIX) 40 MG tablet Take 40 mg by mouth every evening.    04/12/2016 at Unknown time  . pravastatin (PRAVACHOL) 20 MG tablet Take 20 mg by mouth daily.   04/12/2016 at Unknown time  . risperiDONE (RISPERDAL) 1 MG tablet Take 1.5 tablets (1.5 mg total) by mouth daily. Taking 1.5 Tablets QD (Patient taking differently: Take 1.5 mg by mouth daily. ) 135 tablet 2 04/13/2016 at Unknown time  . traZODone (DESYREL) 50 MG tablet Take 1 tablet (50 mg total) by mouth at bedtime. 90 tablet 2 04/12/2016 at Unknown time  . zinc gluconate 50 MG tablet Take 50 mg by mouth daily.   04/13/2016 at Unknown time    Assessment: 65 yo male admitted with chest pain. Pt has a history of CAD and CABG in 2012. He had a myoview today which was abnormal, planning to proceed with cardiac cath tomorrow.  CBC wnl. Pt had Lovenox prophylactic dose earlier today, okay to proceed with  therapeutic heparin.   Goal of Therapy:  Heparin level 0.3-0.7 units/ml Monitor platelets by anticoagulation protocol: Yes   Plan:  -Heparin bolus 4000 units x1 then infusion at 1000 units/hr -Daily HL, CBC -First level this evening     Davante Gerke, Jake Church 04/14/2016,5:00 PM

## 2016-04-14 NOTE — ED Notes (Signed)
Nitroglycerin paste removed due to hypotension.

## 2016-04-14 NOTE — H&P (Signed)
History and Physical    [name] ENI:[id number] DOB: 11-17-1950 DOA: 04/14/2016   PCP: Monico Blitz, MD Chief Complaint:  Chief Complaint  Patient presents with  . Chest Pain    HPI: [name] is a 65 y.o. male with medical history significant of CAD s/p CABG in 2012, last heart cath in 2013.  Patient presents to the ED with c/o chest pain.  Pain onset 8 hours PTA in the ED.  Pain is intermittent, is severe at its worst.  Pain is located in his left chest.  Pain has a "heaviness" quality.  Pain was improved and even resolved initially with SL NTG but has since returned.  No vomiting.    ED Course: Trop negative, EKG shows an old RBBB and is essentially unchanged.  Review of Systems: As per HPI otherwise 10 point review of systems negative.    Past Medical History:  Diagnosis Date  . Anxiety   . Arthritis   . CAD (coronary artery disease)    a. s/p CABG, DES to SVG to PLA, DES to RCA  b. low risk nuclear stress test 04/2014  . COPD (chronic obstructive pulmonary disease) ([place])   . Dementia   . Depression   . Dyslipidemia   . Essential hypertension, benign   . GERD (gastroesophageal reflux disease)   . History of Bell's palsy   . Type 2 diabetes mellitus ([place])     Past Surgical History:  Procedure Laterality Date  . APPENDECTOMY    . CHOLECYSTECTOMY    . CORONARY ARTERY BYPASS GRAFT  01/01/2011   LIMA to LAD, SVG to PLA, SVG to PDA  . HAMMER TOE SURGERY Bilateral   . KNEE ARTHROSCOPY Left   . LEFT HEART CATHETERIZATION WITH CORONARY/GRAFT ANGIOGRAM N/A 03/01/2012   Procedure: LEFT HEART CATHETERIZATION WITH Beatrix Fetters;  Surgeon: Sherren Mocha, MD;  Location: [facility] CATH LAB;  Service: Cardiovascular;  Laterality: N/A;  . ROTATOR CUFF REPAIR Bilateral   . SHOULDER SURGERY     Bilateral  . TONSILLECTOMY     tubes  . TYMPANOMASTOIDECTOMY Right 05/20/2013   Procedure: RIGHT CANAL UP TYMPANOMASTOIDECTOMY WITH TYPE I TYMPANOPLASTY AND INSERTION OF A TUBE;   Surgeon: Fannie Knee, MD;  Location: [facility];  Service: ENT;  Laterality: Right;     reports that he quit smoking about 5 years ago. His smoking use included Cigarettes. He has a 50.00 pack-year smoking history. He has never used smokeless tobacco. He reports that he does not drink alcohol or use drugs.  Allergies  Allergen Reactions  . Bee Venom Anaphylaxis  . Codeine Shortness Of Breath and Other (See Comments)    Family History  Problem Relation Age of Onset  . Diabetes    . Hypertension    . Alcohol abuse Brother       Prior to Admission medications   Medication Sig Start Date End Date Taking? Authorizing Provider  Ascorbic Acid (VITAMIN C PO) Take 1 tablet by mouth daily.   Yes Historical Provider, MD  aspirin 81 MG EC tablet Take 81 mg by mouth daily.     Yes Historical Provider, MD  Biotin 1000 MCG tablet Take 1,000 mcg by mouth daily.   Yes Historical Provider, MD  buPROPion (WELLBUTRIN SR) 150 MG 12 hr tablet Take 1 tablet (150 mg total) by mouth 2 (two) times daily. 05/14/15  Yes Cloria Spring, MD  clopidogrel (PLAVIX) 75 MG tablet Take 75 mg by mouth daily.   Yes Historical Provider, MD  donepezil (ARICEPT) 5 MG tablet Take 1 tablet (5 mg total) by mouth at bedtime. 05/14/15  Yes Cloria Spring, MD  gabapentin (NEURONTIN) 100 MG capsule Take 100 mg by mouth 3 (three) times daily.    Yes Historical Provider, MD  glimepiride (AMARYL) 4 MG tablet Take 4 mg by mouth daily with breakfast.   Yes Historical Provider, MD  lisinopril (PRINIVIL,ZESTRIL) 2.5 MG tablet Take 2.5 mg by mouth daily.   Yes Historical Provider, MD  metFORMIN (GLUCOPHAGE) 1000 MG tablet Take 1,000 mg by mouth 2 (two) times daily with a meal.   Yes Historical Provider, MD  metoprolol tartrate (LOPRESSOR) 25 MG tablet Take 25 mg by mouth 2 (two) times daily.   Yes Historical Provider, MD  Multiple Vitamin (MULTIVITAMIN WITH MINERALS) TABS tablet Take 1 tablet by mouth daily.   Yes Historical Provider, MD    nitroGLYCERIN (NITROSTAT) 0.4 MG SL tablet Place 1 tablet (0.4 mg total) under the tongue every 5 (five) minutes as needed. For chest pain 04/27/14  Yes Eileen Stanford, PA-C  Omega-3 Fatty Acids (FISH OIL PO) Take 1,400 mg by mouth daily.   Yes Historical Provider, MD  pantoprazole (PROTONIX) 40 MG tablet Take 40 mg by mouth every evening.    Yes Historical Provider, MD  pravastatin (PRAVACHOL) 20 MG tablet Take 20 mg by mouth daily.   Yes Historical Provider, MD  risperiDONE (RISPERDAL) 1 MG tablet Take 1.5 tablets (1.5 mg total) by mouth daily. Taking 1.5 Tablets QD Patient taking differently: Take 1.5 mg by mouth daily.  05/14/15  Yes Cloria Spring, MD  traZODone (DESYREL) 50 MG tablet Take 1 tablet (50 mg total) by mouth at bedtime. 05/14/15  Yes Cloria Spring, MD  zinc gluconate 50 MG tablet Take 50 mg by mouth daily.   Yes Historical Provider, MD    Physical Exam: Vitals:   04/14/16 0315 04/14/16 0325 04/14/16 0330 04/14/16 0345  BP: 108/71 103/62 109/63 98/65  Pulse: (!) 56 61 (!) 58 60  Resp: '10 16 13 12  '$ Temp:      TempSrc:      SpO2: 98% 96% 96% 92%  Weight:      Height:          Constitutional: NAD, calm, comfortable Eyes: PERRL, lids and conjunctivae normal ENMT: Mucous membranes are moist. Posterior pharynx clear of any exudate or lesions.Normal dentition.  Neck: normal, supple, no masses, no thyromegaly Respiratory: clear to auscultation bilaterally, no wheezing, no crackles. Normal respiratory effort. No accessory muscle use.  Cardiovascular: Regular rate and rhythm, no murmurs / rubs / gallops. No extremity edema. 2+ pedal pulses. No carotid bruits.  Abdomen: no tenderness, no masses palpated. No hepatosplenomegaly. Bowel sounds positive.  Musculoskeletal: no clubbing / cyanosis. No joint deformity upper and lower extremities. Good ROM, no contractures. Normal muscle tone.  Skin: no rashes, lesions, ulcers. No induration Neurologic: CN 2-12 grossly intact.  Sensation intact, DTR normal. Strength 5/5 in all 4.  Psychiatric: Normal judgment and insight. Alert and oriented x 3. Normal mood.    Labs on Admission: I have personally reviewed following labs and imaging studies  CBC:  Recent Labs Lab 04/13/16 2106  WBC 8.6  HGB 12.6*  HCT 38.2*  MCV 89.7  PLT 676   Basic Metabolic Panel:  Recent Labs Lab 04/13/16 2106  NA 137  K 4.3  CL 100*  CO2 28  GLUCOSE 94  BUN 17  CREATININE 1.07  CALCIUM 9.9   GFR:  Estimated Creatinine Clearance: 74.6 mL/min (by C-G formula based on SCr of 1.07 mg/dL). Liver Function Tests: No results for input(s): AST, ALT, ALKPHOS, BILITOT, PROT, ALBUMIN in the last 168 hours. No results for input(s): LIPASE, AMYLASE in the last 168 hours. No results for input(s): AMMONIA in the last 168 hours. Coagulation Profile: No results for input(s): INR, PROTIME in the last 168 hours. Cardiac Enzymes: No results for input(s): CKTOTAL, CKMB, CKMBINDEX, TROPONINI in the last 168 hours. BNP (last 3 results) No results for input(s): PROBNP in the last 8760 hours. HbA1C: No results for input(s): HGBA1C in the last 72 hours. CBG: No results for input(s): GLUCAP in the last 168 hours. Lipid Profile: No results for input(s): CHOL, HDL, LDLCALC, TRIG, CHOLHDL, LDLDIRECT in the last 72 hours. Thyroid Function Tests: No results for input(s): TSH, T4TOTAL, FREET4, T3FREE, THYROIDAB in the last 72 hours. Anemia Panel: No results for input(s): VITAMINB12, FOLATE, FERRITIN, TIBC, IRON, RETICCTPCT in the last 72 hours. Urine analysis:    Component Value Date/Time   COLORURINE YELLOW 12/20/2010 2031   APPEARANCEUR CLEAR 12/20/2010 2031   LABSPEC 1.022 12/20/2010 2031   PHURINE 6.0 12/20/2010 2031   GLUCOSEU NEGATIVE 12/20/2010 2031   HGBUR NEGATIVE 12/20/2010 2031   BILIRUBINUR NEGATIVE 12/20/2010 2031   KETONESUR NEGATIVE 12/20/2010 2031   PROTEINUR NEGATIVE 12/20/2010 2031   UROBILINOGEN 0.2 12/20/2010 2031    NITRITE NEGATIVE 12/20/2010 2031   LEUKOCYTESUR NEGATIVE 12/20/2010 2031   Sepsis Labs: '@LABRCNTIP'$ (procalcitonin:4,lacticidven:4) )No results found for this or any previous visit (from the past 240 hour(s)).   Radiological Exams on Admission: Dg Chest 2 View  Result Date: 04/13/2016 CLINICAL DATA:  Shortness of breath and chest pain EXAM: CHEST  2 VIEW COMPARISON:  April 26, 2014 FINDINGS: There is no edema or consolidation. The heart size and pulmonary vascularity are normal. No adenopathy. Patient is status post coronary artery bypass grafting. No pneumothorax. There is evidence of previous erosion of the lateral right clavicle with acromioclavicular separation on the right, stable. IMPRESSION: No edema or consolidation. Electronically Signed   By: Lowella Grip III M.D.   On: 04/13/2016 21:58    EKG: Independently reviewed.  Assessment/Plan Principal Problem:   Chest pain, rule out acute myocardial infarction Active Problems:   Status post coronary artery bypass grafting   Essential hypertension, benign   Type 2 diabetes mellitus (HCC)    1. CP r/o - 1. CP pathway 2. Serial trops 3. Tele monitor 4. Will treat pain with NTG paste and morphine if needed 5. Call cards in AM to see about next steps (stress test vs cath) as he is at somewhat increased risk with a heart score of at least 5 and a personal coronary history. 6. NPO 2. HTN - continue home BP meds 3. DM 2 -  1. Holding PO hypoglycemics 2. SSI Q4H   DVT prophylaxis: Lovenox Code Status: Full Family Communication: No family in room Consults called: None Admission status: Admit to obs   Majestic Brister, [place] Pager [phone] from 7PM-7AM  If 7AM-7PM, please contact the day physician for the patient [email or web address] Password [email or web address]  04/14/2016, 4:11 AM

## 2016-04-14 NOTE — ED Notes (Signed)
Pt states he feels very weak, assisted with bedside urinal. Vital signs stable. Remains on cardiac monitor. 7/10 chest pain at present. Pt updated on plan of care for admission to step down unit.

## 2016-04-14 NOTE — H&P (Signed)
History and Physical    [name] PPJ:[id number] DOB: [birth year] DOA: 04/14/2016   PCP: Monico Blitz, MD   Patient coming from/Resides with: Private residence/with spouse  Admission status: Observation/telemetry -we'll need to reevaluate in 24 hours to determine if it will be medically necessary to stay a minimum 2 midnights to rule out impending and/or unexpected changes in physiologic status that may differ from initial evaluation performed in the ER and/or at time of admission. Patient presents with anterior right-sided chest pain that he describes as typical for previous episodes but also reproducible with palpation. Initial troponin within normal limits. Does have history of CAD and will require cardiologist evaluation before discharge.  Chief Complaint: Chest pain  HPI: [name] is a 65 y.o. male with medical history significant for known coronary artery disease status post CABG in 2012 with subsequent drug-eluting stent to SVG/RCA in 2013. Hospitalized in 2015 for chest pain with negative Lexiscan stress test., Diabetes, hypertension, COPD, dementia with severe depression, dyslipidemia and GERD. Patient reports he began having chest pain last night around 5 PM while taking his bath. He went to church without any improvement in his chest pain and it seemed to worsen especially with activity. With each severe episode of this chest pain he has been dizzy and nauseated. The pain has been waxing and waning but never resolved and son since he presented to the ED. He's been given multiple doses of sublingual nitroglycerin with only decrease in pain from 10/10 to 6/10. He was eventually given some IV morphine with near resolution of pain. Nitroglycerin paste had been applied in the ER but when systolic blood pressure dropped into the 90s this was discontinued. Initial troponin was within normal limits. EKG unrevealing in light of chronic right bundle-branch block.  ED Course:  Vital Signs: BP  116/73   Pulse 63   Temp 98.6 F (37 C) (Oral)   Resp 14   Ht '5\' 8"'$  (1.727 m)   Wt 81.7 kg (180 lb 1.9 oz)   SpO2 95%   BMI 27.39 kg/m  2 view CXR: No edema or consolidation Lab data: Sodium 137, potassium 4.3, chloride 100, BUN 17, creatinine 1.07, glucose 94,poc troponin 0.01, TNI less than 0.03, white count 8600 range obtained, hemoglobin 12.6, platelets 278,000 Medications and treatments: Aspirin 324 mg PO 1, nitroglycerin 0.4 mg SL Imes 3, morphine 2 mg IV 2  Review of Systems:  In addition to the HPI above,  No Fever-chills, myalgias or other constitutional symptoms No Headache, changes with Vision or hearing, new weakness, tingling, numbness in any extremity, dysarthria or word finding difficulty, gait disturbance or imbalance, tremors or seizure activity No problems swallowing food or Liquids, indigestion/reflux, choking or coughing while eating, abdominal pain with or after eating No Cough or Shortness of Breath, palpitations, orthopnea or DOE No Abdominal pain, emesis, melena,hematochezia, dark tarry stools, constipation No dysuria, malodorous urine, hematuria or flank pain No new skin rashes, lesions, masses or bruises, No new joint pains, aches, swelling or redness No recent unintentional weight gain or loss No polyuria, polydypsia or polyphagia   Past Medical History:  Diagnosis Date  . Anxiety   . Arthritis   . CAD (coronary artery disease)    a. s/p CABG, DES to SVG to PLA, DES to RCA  b. low risk nuclear stress test 04/2014  . COPD (chronic obstructive pulmonary disease) ([place])   . Dementia   . Depression   . Dyslipidemia   . Essential hypertension, benign   .  GERD (gastroesophageal reflux disease)   . History of Bell's palsy   . Type 2 diabetes mellitus ([place])     Past Surgical History:  Procedure Laterality Date  . APPENDECTOMY    . CHOLECYSTECTOMY    . CORONARY ARTERY BYPASS GRAFT  01/01/2011   LIMA to LAD, SVG to PLA, SVG to PDA  . HAMMER TOE  SURGERY Bilateral   . KNEE ARTHROSCOPY Left   . LEFT HEART CATHETERIZATION WITH CORONARY/GRAFT ANGIOGRAM N/A 03/01/2012   Procedure: LEFT HEART CATHETERIZATION WITH Beatrix Fetters;  Surgeon: Sherren Mocha, MD;  Location: [facility] CATH LAB;  Service: Cardiovascular;  Laterality: N/A;  . ROTATOR CUFF REPAIR Bilateral   . SHOULDER SURGERY     Bilateral  . TONSILLECTOMY     tubes  . TYMPANOMASTOIDECTOMY Right 05/20/2013   Procedure: RIGHT CANAL UP TYMPANOMASTOIDECTOMY WITH TYPE I TYMPANOPLASTY AND INSERTION OF A TUBE;  Surgeon: Fannie Knee, MD;  Location: [facility];  Service: ENT;  Laterality: Right;    Social History   Social History  . Marital status: Married    Spouse name: N/A  . Number of children: N/A  . Years of education: N/A   Occupational History  . Not on file.   Social History Main Topics  . Smoking status: Former Smoker    Packs/day: 1.00    Years: 50.00    Types: Cigarettes    Quit date: 12/26/2010  . Smokeless tobacco: Never Used  . Alcohol use No     Comment: drinks alcohol occasionally  . Drug use: No  . Sexual activity: Yes   Other Topics Concern  . 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 Bee Venom Anaphylaxis  . Codeine Shortness Of Breath and Other (See Comments)    Family History  Problem Relation Age of Onset  . Diabetes    . Hypertension    . Alcohol abuse Brother      Prior to Admission medications   Medication Sig Start Date End Date Taking? Authorizing Provider  Ascorbic Acid (VITAMIN C PO) Take 1 tablet by mouth daily.   Yes Historical Provider, MD  aspirin 81 MG EC tablet Take 81 mg by mouth daily.     Yes Historical Provider, MD  Biotin 1000 MCG tablet Take 1,000 mcg by mouth daily.   Yes Historical Provider, MD  buPROPion (WELLBUTRIN SR) 150 MG 12 hr tablet  Take 1 tablet (150 mg total) by mouth 2 (two) times daily. 05/14/15  Yes Cloria Spring, MD  clopidogrel (PLAVIX) 75 MG tablet Take 75 mg by mouth daily.   Yes Historical Provider, MD  donepezil (ARICEPT) 5 MG tablet Take 1 tablet (5 mg total) by mouth at bedtime. 05/14/15  Yes Cloria Spring, MD  gabapentin (NEURONTIN) 100 MG capsule Take 100 mg by mouth 3 (three) times daily.    Yes Historical Provider, MD  glimepiride (AMARYL) 4 MG tablet Take 4 mg by mouth daily with breakfast.   Yes Historical Provider, MD  lisinopril (PRINIVIL,ZESTRIL) 2.5 MG tablet Take 2.5 mg by mouth daily.   Yes Historical Provider, MD  metFORMIN (GLUCOPHAGE) 1000 MG tablet Take 1,000 mg by mouth 2 (two) times daily with a meal.   Yes Historical Provider, MD  metoprolol tartrate (LOPRESSOR) 25 MG tablet Take 25 mg by mouth 2 (  two) times daily.   Yes Historical Provider, MD  Multiple Vitamin (MULTIVITAMIN WITH MINERALS) TABS tablet Take 1 tablet by mouth daily.   Yes Historical Provider, MD  nitroGLYCERIN (NITROSTAT) 0.4 MG SL tablet Place 1 tablet (0.4 mg total) under the tongue every 5 (five) minutes as needed. For chest pain 04/27/14  Yes Eileen Stanford, PA-C  Omega-3 Fatty Acids (FISH OIL PO) Take 1,400 mg by mouth daily.   Yes Historical Provider, MD  pantoprazole (PROTONIX) 40 MG tablet Take 40 mg by mouth every evening.    Yes Historical Provider, MD  pravastatin (PRAVACHOL) 20 MG tablet Take 20 mg by mouth daily.   Yes Historical Provider, MD  risperiDONE (RISPERDAL) 1 MG tablet Take 1.5 tablets (1.5 mg total) by mouth daily. Taking 1.5 Tablets QD Patient taking differently: Take 1.5 mg by mouth daily.  05/14/15  Yes Cloria Spring, MD  traZODone (DESYREL) 50 MG tablet Take 1 tablet (50 mg total) by mouth at bedtime. 05/14/15  Yes Cloria Spring, MD  zinc gluconate 50 MG tablet Take 50 mg by mouth daily.   Yes Historical Provider, MD    Physical Exam: Vitals:   04/14/16 0700 04/14/16 0728 04/14/16 0732  04/14/16 0823  BP: 106/63  105/73 116/73  Pulse: 64 66 70 63  Resp: '11 18 12 14  '$ Temp:      TempSrc:      SpO2: 93% 95% 96% 95%  Weight:    81.7 kg (180 lb 1.9 oz)  Height:    '5\' 8"'$  (1.727 m)      Constitutional: NAD, calm, comfortable Eyes: PERRL, lids and conjunctivae normal ENMT: Mucous membranes are moist. Posterior pharynx clear of any exudate or lesions.Normal dentition.  Neck: normal, supple, no masses, no thyromegaly Respiratory: clear to auscultation bilaterally, no wheezing, no crackles. Normal respiratory effort. No accessory muscle use.  Cardiovascular: Regular rate and rhythm, no murmurs / rubs / gallops. No extremity edema. 2+ pedal pulses. No carotid bruits.  Abdomen: no tenderness, no masses palpated. No hepatosplenomegaly. Bowel sounds positive.  Musculoskeletal: no clubbing / cyanosis. No joint deformity upper and lower extremities. Good ROM, no contractures. Normal muscle tone. Anterior chest wall tender to palpation Skin: no rashes, lesions, ulcers. No induration Neurologic: CN 2-12 grossly intact. Sensation intact, DTR normal. Strength 5/5 x all 4 extremities.  Psychiatric: Alert and oriented x 3. Flat affect   Labs on Admission: I have personally reviewed following labs and imaging studies  CBC:  Recent Labs Lab 04/13/16 2106  WBC 8.6  HGB 12.6*  HCT 38.2*  MCV 89.7  PLT 947   Basic Metabolic Panel:  Recent Labs Lab 04/13/16 2106  NA 137  K 4.3  CL 100*  CO2 28  GLUCOSE 94  BUN 17  CREATININE 1.07  CALCIUM 9.9   GFR: Estimated Creatinine Clearance: 68.4 mL/min (by C-G formula based on SCr of 1.07 mg/dL). Liver Function Tests: No results for input(s): AST, ALT, ALKPHOS, BILITOT, PROT, ALBUMIN in the last 168 hours. No results for input(s): LIPASE, AMYLASE in the last 168 hours. No results for input(s): AMMONIA in the last 168 hours. Coagulation Profile: No results for input(s): INR, PROTIME in the last 168 hours. Cardiac  Enzymes:  Recent Labs Lab 04/14/16 0326  TROPONINI <0.03   BNP (last 3 results) No results for input(s): PROBNP in the last 8760 hours. HbA1C: No results for input(s): HGBA1C in the last 72 hours. CBG:  Recent Labs Lab 04/14/16 0412 04/14/16 0731  GLUCAP 155* 164*   Lipid Profile: No results for input(s): CHOL, HDL, LDLCALC, TRIG, CHOLHDL, LDLDIRECT in the last 72 hours. Thyroid Function Tests: No results for input(s): TSH, T4TOTAL, FREET4, T3FREE, THYROIDAB in the last 72 hours. Anemia Panel: No results for input(s): VITAMINB12, FOLATE, FERRITIN, TIBC, IRON, RETICCTPCT in the last 72 hours. Urine analysis:    Component Value Date/Time   COLORURINE YELLOW 12/20/2010 2031   APPEARANCEUR CLEAR 12/20/2010 2031   LABSPEC 1.022 12/20/2010 2031   PHURINE 6.0 12/20/2010 2031   GLUCOSEU NEGATIVE 12/20/2010 2031   HGBUR NEGATIVE 12/20/2010 2031   BILIRUBINUR NEGATIVE 12/20/2010 2031   KETONESUR NEGATIVE 12/20/2010 2031   PROTEINUR NEGATIVE 12/20/2010 2031   UROBILINOGEN 0.2 12/20/2010 2031   NITRITE NEGATIVE 12/20/2010 2031   LEUKOCYTESUR NEGATIVE 12/20/2010 2031   Sepsis Labs: '@LABRCNTIP'$ (procalcitonin:4,lacticidven:4) )No results found for this or any previous visit (from the past 240 hour(s)).   Radiological Exams on Admission: Dg Chest 2 View  Result Date: 04/13/2016 CLINICAL DATA:  Shortness of breath and chest pain EXAM: CHEST  2 VIEW COMPARISON:  April 26, 2014 FINDINGS: There is no edema or consolidation. The heart size and pulmonary vascularity are normal. No adenopathy. Patient is status post coronary artery bypass grafting. No pneumothorax. There is evidence of previous erosion of the lateral right clavicle with acromioclavicular separation on the right, stable. IMPRESSION: No edema or consolidation. Electronically Signed   By: Lowella Grip III M.D.   On: 04/13/2016 21:58    EKG: (Independently reviewed) Sinus bradycardia with ventricular rate 59 bpm, QTC  483 ms, underlying right bundle branch block  Assessment/Plan Principal Problem:   Chest pain, rule out acute myocardial infarction/ Status post coronary artery bypass grafting -Patient presents with prolonged right-sided chest pain waxing and waning that is reproducible with palpation with associated normal troponin -Chest pain order set initiated -Cycle troponin -Continue preadmission aspirin, Plavix, statin, and omega- 3 fatty acids and Lopressor -Lexiscan Cardiolite November 2015 nonischemic with preserved LVEF -Cardiology consultation -Toradol 1 for chest wall pain -NPO until evaluated by cardiology  Active Problems:   Essential hypertension, benign -Current blood pressure mildly suboptimal but will continue very low-dose ACE inhibitor for now    COPD (chronic obstructive pulmonary disease)  -Asymptomatic without wheezing    Type 2 diabetes mellitus  -CBGs every 4 hours -Hold Amaryl since NPO -Hold metformin during acute illness -Hemoglobin A1c -Neurontin for neuropathy    Dyslipidemia -Pravachol and omega-3 fatty acids    Dementia / Major depressive disorder, recurrent, severe without psychotic behavior  -Wellbutrin, Aricept, trazodone and Risperdal    GERD (gastroesophageal reflux disease) -Protonix       DVT prophylaxis: Lovenox Code Status: Full Family Communication: No family at bedside Disposition Plan: Discharge back to preadmission home environment when medically stable Consults called: [facility] I'll just on call    ELLIS,ALLISON L. ANP-BC [facility] Pager [phone]   If 7PM-7AM, please contact night-coverage [email or web address] Password [email or web address]  04/14/2016, 8:29 AM

## 2016-04-14 NOTE — ED Notes (Signed)
Pharmacist notified to verify pt.'s insulin order due at 0400 .

## 2016-04-14 NOTE — ED Notes (Signed)
RN unable to take report at the time. To call back.

## 2016-04-14 NOTE — Progress Notes (Signed)
   [name] presented for a [employer] today.  No immediate complications.  Stress imaging is pending at this time.  Reino Bellis, NP 04/14/2016, 12:58 PM

## 2016-04-14 NOTE — Care Management Obs Status (Signed)
[place] NOTIFICATION   Patient Details  Name: Randi College MRN: [id number] Date of Birth: 10/08/1950   Medicare Observation Status Notification Given:  Yes    Bethena Roys, RN 04/14/2016, 4:22 PM

## 2016-04-14 NOTE — Consult Note (Signed)
CARDIOLOGY CONSULT NOTE   Patient ID: [name] MRN: [id number] DOB/AGE: [birth year] 65 y.o.  Admit date: 04/14/2016  Primary Physician   Monico Blitz, MD Primary Cardiologist   Dr. Domenic Polite Reason for Consultation   Chest pain Requesting Physician  Dr. Alcario Drought  HPI: [name] is a 65 y.o. male with a history of CAD s/p CABG with subsequent DES interventions to the SVG to PLA as well as native RCA, HTN, HLD, DM, mild dementia and GERD who presented for evaluation of chest pain.  Last admitted 03/2014 for chest pain. He is ruled out. Myoview was negative. He was doing well on cardiac perspective when last seen by Dr. Domenic Polite 05/2015.  Yesterday around 5 PM while taking a bath he had a episode of substernal chest pain. He described the chest pain as a pressure sensation which is similar to prior cardiac pain. He went to church without improvement however it worsened with activity. He took sublingual nitroglycerin x 2 with minimal relief. He came to the ER for further evaluation. His pain eventually improved after IV morphine and Nitropaste. Currently having 8 out of 10 chest pressure. His nitro paste is discontinued in the ER due to hypotension. He has recently noted difficulty breathing with sitting. Patient admits to having a dry cough and orthopnea. Denies lower extremity edema, syncope, abdominal pain, melena or blood in his stool or urine. Bending down and twisting turning make it worse.  EKG shows sinus rhythm at rate of 59 bpm and chronic right bundle branch block. Point-of-care troponin negative. Troponin I negative. Electrolytes normal. Chest x-ray without consolidation.  Past Medical History:  Diagnosis Date  . Anxiety   . Arthritis   . CAD (coronary artery disease)    a. s/p CABG, DES to SVG to PLA, DES to RCA  b. low risk nuclear stress test 04/2014  . COPD (chronic obstructive pulmonary disease) ([place])   . Dementia   . Depression   . Dyslipidemia   . Essential  hypertension, benign   . GERD (gastroesophageal reflux disease)   . History of Bell's palsy   . Type 2 diabetes mellitus ([place])      Past Surgical History:  Procedure Laterality Date  . APPENDECTOMY    . CHOLECYSTECTOMY    . CORONARY ARTERY BYPASS GRAFT  01/01/2011   LIMA to LAD, SVG to PLA, SVG to PDA  . HAMMER TOE SURGERY Bilateral   . KNEE ARTHROSCOPY Left   . LEFT HEART CATHETERIZATION WITH CORONARY/GRAFT ANGIOGRAM N/A 03/01/2012   Procedure: LEFT HEART CATHETERIZATION WITH Beatrix Fetters;  Surgeon: Sherren Mocha, MD;  Location: [facility] CATH LAB;  Service: Cardiovascular;  Laterality: N/A;  . ROTATOR CUFF REPAIR Bilateral   . SHOULDER SURGERY     Bilateral  . TONSILLECTOMY     tubes  . TYMPANOMASTOIDECTOMY Right 05/20/2013   Procedure: RIGHT CANAL UP TYMPANOMASTOIDECTOMY WITH TYPE I TYMPANOPLASTY AND INSERTION OF A TUBE;  Surgeon: Fannie Knee, MD;  Location: [place];  Service: ENT;  Laterality: Right;    Allergies  Allergen Reactions  . Bee Venom Anaphylaxis  . Codeine Shortness Of Breath and Other (See Comments)    I have reviewed the patient's current medications . aspirin EC  81 mg Oral Daily  . buPROPion  150 mg Oral BID WC  . clopidogrel  75 mg Oral Daily  . donepezil  5 mg Oral QHS  . enoxaparin (LOVENOX) injection  40 mg Subcutaneous Q24H  . gabapentin  100 mg Oral TID  . [  START ON 04/15/2016] Influenza vac split quadrivalent PF  0.5 mL Intramuscular Tomorrow-1000  . insulin aspart  0-9 Units Subcutaneous Q4H  . lisinopril  2.5 mg Oral Daily  . metoprolol tartrate  25 mg Oral BID  . multivitamin with minerals  1 tablet Oral Daily  . pantoprazole  40 mg Oral QPM  . [START ON 04/15/2016] pneumococcal 23 valent vaccine  0.5 mL Intramuscular Tomorrow-1000  . pravastatin  20 mg Oral q1800  . risperiDONE  1.5 mg Oral Daily  . traZODone  50 mg Oral QHS     acetaminophen, morphine injection, ondansetron (ZOFRAN) IV  Prior to Admission medications   Medication  Sig Start Date End Date Taking? Authorizing Provider  Ascorbic Acid (VITAMIN C PO) Take 1 tablet by mouth daily.   Yes Historical Provider, MD  aspirin 81 MG EC tablet Take 81 mg by mouth daily.     Yes Historical Provider, MD  Biotin 1000 MCG tablet Take 1,000 mcg by mouth daily.   Yes Historical Provider, MD  buPROPion (WELLBUTRIN SR) 150 MG 12 hr tablet Take 1 tablet (150 mg total) by mouth 2 (two) times daily. 05/14/15  Yes Cloria Spring, MD  clopidogrel (PLAVIX) 75 MG tablet Take 75 mg by mouth daily.   Yes Historical Provider, MD  donepezil (ARICEPT) 5 MG tablet Take 1 tablet (5 mg total) by mouth at bedtime. 05/14/15  Yes Cloria Spring, MD  gabapentin (NEURONTIN) 100 MG capsule Take 100 mg by mouth 3 (three) times daily.    Yes Historical Provider, MD  glimepiride (AMARYL) 4 MG tablet Take 4 mg by mouth daily with breakfast.   Yes Historical Provider, MD  lisinopril (PRINIVIL,ZESTRIL) 2.5 MG tablet Take 2.5 mg by mouth daily.   Yes Historical Provider, MD  metFORMIN (GLUCOPHAGE) 1000 MG tablet Take 1,000 mg by mouth 2 (two) times daily with a meal.   Yes Historical Provider, MD  metoprolol tartrate (LOPRESSOR) 25 MG tablet Take 25 mg by mouth 2 (two) times daily.   Yes Historical Provider, MD  Multiple Vitamin (MULTIVITAMIN WITH MINERALS) TABS tablet Take 1 tablet by mouth daily.   Yes Historical Provider, MD  nitroGLYCERIN (NITROSTAT) 0.4 MG SL tablet Place 1 tablet (0.4 mg total) under the tongue every 5 (five) minutes as needed. For chest pain 04/27/14  Yes Eileen Stanford, PA-C  Omega-3 Fatty Acids (FISH OIL PO) Take 1,400 mg by mouth daily.   Yes Historical Provider, MD  pantoprazole (PROTONIX) 40 MG tablet Take 40 mg by mouth every evening.    Yes Historical Provider, MD  pravastatin (PRAVACHOL) 20 MG tablet Take 20 mg by mouth daily.   Yes Historical Provider, MD  risperiDONE (RISPERDAL) 1 MG tablet Take 1.5 tablets (1.5 mg total) by mouth daily. Taking 1.5 Tablets QD Patient  taking differently: Take 1.5 mg by mouth daily.  05/14/15  Yes Cloria Spring, MD  traZODone (DESYREL) 50 MG tablet Take 1 tablet (50 mg total) by mouth at bedtime. 05/14/15  Yes Cloria Spring, MD  zinc gluconate 50 MG tablet Take 50 mg by mouth daily.   Yes Historical Provider, MD     Social History   Social History  . Marital status: Married    Spouse name: N/A  . Number of children: N/A  . Years of education: N/A   Occupational History  . Not on file.   Social History Main Topics  . Smoking status: Former Smoker    Packs/day: 1.00  Years: 50.00    Types: Cigarettes    Quit date: 12/26/2010  . Smokeless tobacco: Never Used  . Alcohol use No     Comment: drinks alcohol occasionally  . Drug use: No  . Sexual activity: Yes   Other Topics Concern  . 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    Marland Kitchen Brother    Family History  Problem Relation Age of Onset  . Diabetes    . Hypertension    . Alcohol abuse Brother     ROS:  Full 14 point review of systems complete and found to be negative unless listed above.  Physical Exam: Blood pressure 116/73, pulse 62, temperature 98.3 F (36.8 C), temperature source Oral, resp. rate 16, height '5\' 8"'$  (1.727 m), weight 180 lb 1.9 oz (81.7 kg), SpO2 96 %.  General: Well developed, well nourished, male in no acute distress Head: Eyes PERRLA, No xanthomas. Normocephalic and atraumatic, oropharynx without edema or exudate.  Lungs: Resp regular and unlabored, CTA. Heart: RRR no s3, s4, or murmurs..  Chest pain is reproducible with palpation. Neck: No carotid bruits. No lymphadenopathy. NoJVD. Abdomen: Bowel sounds present, abdomen soft and non-tender without masses or hernias noted. Msk:  No spine or cva tenderness. No weakness, no joint deformities or effusions. Extremities: No clubbing, cyanosis or edema. DP/PT/Radials 2+ and  equal bilaterally. Neuro: Alert and oriented X 3. No focal deficits noted. Psych:  Good affect, responds appropriately Skin: No rashes or lesions noted.  Labs:   Lab Results  Component Value Date   WBC 8.6 04/13/2016   HGB 12.6 (L) 04/13/2016   HCT 38.2 (L) 04/13/2016   MCV 89.7 04/13/2016   PLT 278 04/13/2016   No results for input(s): INR in the last 72 hours.  Recent Labs Lab 04/13/16 2106  NA 137  K 4.3  CL 100*  CO2 28  BUN 17  CREATININE 1.07  CALCIUM 9.9  GLUCOSE 94   Magnesium  Date Value Ref Range Status  12/22/2010 2.1 1.5 - 2.5 mg/dL Final    Recent Labs  04/14/16 0326  TROPONINI <0.03    Recent Labs  04/13/16 2127  TROPIPOC 0.01   Pro B Natriuretic peptide (BNP)  Date/Time Value Ref Range Status  04/25/2014 07:05 PM 111.7 0 - 125 pg/mL Final   Lab Results  Component Value Date   CHOL 180 03/02/2012   HDL 32 (L) 03/02/2012   LDLCALC 120 (H) 03/02/2012   TRIG 140 03/02/2012   Echo: 03/2019 showed left ventricular function of 96-04% and diastolic dysfunction.    Radiology:  Dg Chest 2 View  Result Date: 04/13/2016 CLINICAL DATA:  Shortness of breath and chest pain EXAM: CHEST  2 VIEW COMPARISON:  April 26, 2014 FINDINGS: There is no edema or consolidation. The heart size and pulmonary vascularity are normal. No adenopathy. Patient is status post coronary artery bypass grafting. No pneumothorax. There is evidence of previous erosion of the lateral right clavicle with acromioclavicular separation on the right, stable. IMPRESSION: No edema or consolidation. Electronically Signed   By: Lowella Grip III M.D.   On: 04/13/2016 21:58    ASSESSMENT AND PLAN:     1. Chest pain - Has both typical and atypical features. Similar to prior cardiac pain and minimally responsive to nitroglycerin. His pain in also reproducible with palpation as well as  worsened with movement. Pleuritic in nature. No rub on exam. EKG without acute ischemic changes.  Troponin negative x 1. Continue cycle troponin. Will get Myoview today. Will BNP of orthopnea however chest xray clear.   2. CAD s/p CAD and DES to SVT to PLD and native RCA. - As above. Continue aspirin, Plavix, metoprolol, lisinopril and pravastatin.  3. HTN - Stable. Continue current regimen.  4. HLD - Continue pravastatin 20 mg. Will check lipid panel.  5. DM - Per primary. Pending A1c.   SignedLeanor Kail, [place] 04/14/2016, 9:41 AM Pager [phone]  Co-Sign MD

## 2016-04-14 NOTE — Progress Notes (Signed)
    I talked with the patient about his abnormal myoview today. Will plan for cath tomorrow. Discussed with Dr. Oval Linsey, who agrees with cath.   The patient understands that risks included but are not limited to stroke (1 in 1000), death (1 in 75), kidney failure [usually temporary] (1 in 500), bleeding (1 in 200), allergic reaction [possibly serious] (1 in 200).    Reino Bellis NP-c

## 2016-04-14 NOTE — ED Notes (Signed)
Novolog 2 units SQ given .

## 2016-04-14 NOTE — ED Notes (Signed)
Dr. Rae Halsted ( hospitalist) notified on pt.'s central chest pressure .

## 2016-04-14 NOTE — ED Notes (Signed)
Report given to Allison, RN

## 2016-04-15 ENCOUNTER — Encounter (HOSPITAL_COMMUNITY): Payer: Self-pay | Admitting: General Practice

## 2016-04-15 ENCOUNTER — Encounter (HOSPITAL_COMMUNITY)
Admission: EM | Disposition: A | Discharge status: Home / Self Care | Payer: Self-pay | Source: Home / Self Care | Attending: Emergency Medicine

## 2016-04-15 DIAGNOSIS — E785 Hyperlipidemia, unspecified: Secondary | ICD-10-CM | POA: Diagnosis not present

## 2016-04-15 DIAGNOSIS — J449 Chronic obstructive pulmonary disease, unspecified: Secondary | ICD-10-CM

## 2016-04-15 DIAGNOSIS — F0390 Unspecified dementia without behavioral disturbance: Secondary | ICD-10-CM

## 2016-04-15 DIAGNOSIS — I200 Unstable angina: Secondary | ICD-10-CM | POA: Diagnosis not present

## 2016-04-15 DIAGNOSIS — F332 Major depressive disorder, recurrent severe without psychotic features: Secondary | ICD-10-CM

## 2016-04-15 DIAGNOSIS — Z951 Presence of aortocoronary bypass graft: Secondary | ICD-10-CM

## 2016-04-15 DIAGNOSIS — K219 Gastro-esophageal reflux disease without esophagitis: Secondary | ICD-10-CM

## 2016-04-15 DIAGNOSIS — R079 Chest pain, unspecified: Secondary | ICD-10-CM | POA: Diagnosis not present

## 2016-04-15 DIAGNOSIS — E1159 Type 2 diabetes mellitus with other circulatory complications: Secondary | ICD-10-CM

## 2016-04-15 DIAGNOSIS — I10 Essential (primary) hypertension: Secondary | ICD-10-CM

## 2016-04-15 DIAGNOSIS — I2510 Atherosclerotic heart disease of native coronary artery without angina pectoris: Secondary | ICD-10-CM

## 2016-04-15 DIAGNOSIS — I110 Hypertensive heart disease with heart failure: Secondary | ICD-10-CM

## 2016-04-15 DIAGNOSIS — E118 Type 2 diabetes mellitus with unspecified complications: Secondary | ICD-10-CM

## 2016-04-15 HISTORY — PX: CARDIAC CATHETERIZATION: SHX172

## 2016-04-15 LAB — CBC
HCT: 35.2 % — ABNORMAL LOW (ref 39.0–52.0)
HEMOGLOBIN: 11.7 g/dL — AB (ref 13.0–17.0)
MCH: 29.7 pg (ref 26.0–34.0)
MCHC: 33.2 g/dL (ref 30.0–36.0)
MCV: 89.3 fL (ref 78.0–100.0)
Platelets: 238 10*3/uL (ref 150–400)
RBC: 3.94 MIL/uL — ABNORMAL LOW (ref 4.22–5.81)
RDW: 13 % (ref 11.5–15.5)
WBC: 9.4 10*3/uL (ref 4.0–10.5)

## 2016-04-15 LAB — POCT ACTIVATED CLOTTING TIME: Activated Clotting Time: 103 seconds

## 2016-04-15 LAB — GLUCOSE, CAPILLARY
GLUCOSE-CAPILLARY: 136 mg/dL — AB (ref 65–99)
GLUCOSE-CAPILLARY: 139 mg/dL — AB (ref 65–99)
GLUCOSE-CAPILLARY: 145 mg/dL — AB (ref 65–99)
Glucose-Capillary: 171 mg/dL — ABNORMAL HIGH (ref 65–99)
Glucose-Capillary: 148 mg/dL — ABNORMAL HIGH (ref 65–99)
Glucose-Capillary: 117 mg/dL — ABNORMAL HIGH (ref 65–99)
Glucose-Capillary: 105 mg/dL — ABNORMAL HIGH (ref 65–99)
Glucose-Capillary: 137 mg/dL — ABNORMAL HIGH (ref 65–99)

## 2016-04-15 LAB — HEMOGLOBIN A1C
Hgb A1c MFr Bld: 6.7 % — ABNORMAL HIGH (ref 4.8–5.6)
Mean Plasma Glucose: 146 mg/dL

## 2016-04-15 LAB — HEPARIN LEVEL (UNFRACTIONATED): Heparin Unfractionated: 0.39 IU/mL (ref 0.30–0.70)

## 2016-04-15 LAB — PROTIME-INR
INR: 1.05
Prothrombin Time: 13.7 seconds (ref 11.4–15.2)

## 2016-04-15 SURGERY — LEFT HEART CATH AND CORS/GRAFTS ANGIOGRAPHY
Anesthesia: LOCAL

## 2016-04-15 MED ORDER — MIDAZOLAM HCL 2 MG/2ML IJ SOLN
INTRAMUSCULAR | Status: DC | PRN
  Administered 2016-04-15: 1 mg via INTRAVENOUS

## 2016-04-15 MED ORDER — IOPAMIDOL (ISOVUE-370) INJECTION 76%
INTRAVENOUS | Status: AC
  Filled 2016-04-15: qty 100

## 2016-04-15 MED ORDER — INFLUENZA VAC SPLIT QUAD 0.5 ML IM SUSY
0.5000 mL | PREFILLED_SYRINGE | INTRAMUSCULAR | Status: AC
  Administered 2016-04-16: 0.5 mL via INTRAMUSCULAR
  Filled 2016-04-15: qty 0.5

## 2016-04-15 MED ORDER — VERAPAMIL HCL 2.5 MG/ML IV SOLN
INTRAVENOUS | Status: AC
  Filled 2016-04-15: qty 2

## 2016-04-15 MED ORDER — SODIUM CHLORIDE 0.9% FLUSH
3.0000 mL | Freq: Two times a day (BID) | INTRAVENOUS | Status: DC
  Administered 2016-04-15: 3 mL via INTRAVENOUS

## 2016-04-15 MED ORDER — IOPAMIDOL (ISOVUE-370) INJECTION 76%
INTRAVENOUS | Status: DC | PRN
  Administered 2016-04-15: 150 mL via INTRA_ARTERIAL

## 2016-04-15 MED ORDER — ONDANSETRON HCL 4 MG/2ML IJ SOLN: 4.0000 mg | Freq: Four times a day (QID) | INTRAMUSCULAR | Status: DC | PRN

## 2016-04-15 MED ORDER — FENTANYL CITRATE (PF) 100 MCG/2ML IJ SOLN
INTRAMUSCULAR | Status: AC
  Filled 2016-04-15: qty 2

## 2016-04-15 MED ORDER — HEPARIN SODIUM (PORCINE) 1000 UNIT/ML IJ SOLN
INTRAMUSCULAR | Status: AC
  Filled 2016-04-15: qty 1

## 2016-04-15 MED ORDER — SODIUM CHLORIDE 0.9 % IV SOLN: 250.0000 mL | INTRAVENOUS | Status: DC | PRN

## 2016-04-15 MED ORDER — HEPARIN (PORCINE) IN NACL 2-0.9 UNIT/ML-% IJ SOLN
INTRAMUSCULAR | Status: DC | PRN
  Administered 2016-04-15: 1000 mL

## 2016-04-15 MED ORDER — SODIUM CHLORIDE 0.9 % WEIGHT BASED INFUSION: 1.0000 mL/kg/h | INTRAVENOUS | Status: AC

## 2016-04-15 MED ORDER — PNEUMOCOCCAL VAC POLYVALENT 25 MCG/0.5ML IJ INJ
0.5000 mL | INJECTION | INTRAMUSCULAR | Status: AC
  Administered 2016-04-16: 0.5 mL via INTRAMUSCULAR
  Filled 2016-04-15: qty 0.5

## 2016-04-15 MED ORDER — LIDOCAINE HCL (PF) 1 % IJ SOLN
INTRAMUSCULAR | Status: AC
  Filled 2016-04-15: qty 30

## 2016-04-15 MED ORDER — HEPARIN SODIUM (PORCINE) 5000 UNIT/ML IJ SOLN
5000.0000 [IU] | Freq: Three times a day (TID) | INTRAMUSCULAR | Status: DC
  Administered 2016-04-15 – 2016-04-16 (×2): 5000 [IU] via SUBCUTANEOUS
  Filled 2016-04-15 (×2): qty 1

## 2016-04-15 MED ORDER — LIDOCAINE HCL (PF) 1 % IJ SOLN
INTRAMUSCULAR | Status: DC | PRN
  Administered 2016-04-15: 20 mL via SUBCUTANEOUS
  Administered 2016-04-15: 2 mL via SUBCUTANEOUS

## 2016-04-15 MED ORDER — HEPARIN (PORCINE) IN NACL 2-0.9 UNIT/ML-% IJ SOLN
INTRAMUSCULAR | Status: AC
  Filled 2016-04-15: qty 1000

## 2016-04-15 MED ORDER — ACETAMINOPHEN 325 MG PO TABS: 650.0000 mg | ORAL_TABLET | ORAL | Status: DC | PRN

## 2016-04-15 MED ORDER — SODIUM CHLORIDE 0.9% FLUSH: 3.0000 mL | INTRAVENOUS | Status: DC | PRN

## 2016-04-15 MED ORDER — ASPIRIN 81 MG PO CHEW: 81.0000 mg | CHEWABLE_TABLET | Freq: Every day | ORAL | Status: DC

## 2016-04-15 MED ORDER — FENTANYL CITRATE (PF) 100 MCG/2ML IJ SOLN
INTRAMUSCULAR | Status: DC | PRN
  Administered 2016-04-15: 50 ug via INTRAVENOUS

## 2016-04-15 MED ORDER — MIDAZOLAM HCL 2 MG/2ML IJ SOLN
INTRAMUSCULAR | Status: AC
Start: 2016-04-15 — End: 2016-04-15
  Filled 2016-04-15: qty 2

## 2016-04-15 SURGICAL SUPPLY — 11 items
CATH INFINITI JR4 5F (CATHETERS) ×1 IMPLANT
CATH SITESEER 5F MULTI A 2 (CATHETERS) ×1 IMPLANT
GLIDESHEATH SLEND A-KIT 6F 22G (SHEATH) ×1 IMPLANT
KIT HEART LEFT (KITS) ×2 IMPLANT
PACK CARDIAC CATHETERIZATION (CUSTOM PROCEDURE TRAY) ×2 IMPLANT
SHEATH PINNACLE 5F 10CM (SHEATH) ×1 IMPLANT
TRANSDUCER W/STOPCOCK (MISCELLANEOUS) ×2 IMPLANT
TUBING CIL FLEX 10 FLL-RA (TUBING) ×2 IMPLANT
WIRE EMERALD ST .035X150CM (WIRE) ×1 IMPLANT
WIRE HI TORQ VERSACORE-J 145CM (WIRE) ×1 IMPLANT
WIRE SAFE-T 1.5MM-J .035X260CM (WIRE) ×1 IMPLANT

## 2016-04-15 NOTE — Progress Notes (Addendum)
Site area: RFA Site Prior to Removal:  Level 0 Pressure Applied For:20 min Manual: yes   Patient Status During Pull:  stable Post Pull Site:  Level 0 Post Pull Instructions Given:  yes Post Pull Pulses Present:palpable  Dressing Applied:  tegaderm Bedrest begins @ 9357 till 1955 Comments:

## 2016-04-15 NOTE — H&P (View-Only) (Signed)
Patient Name: [name] Date of Encounter: 04/15/2016  Primary Cardiologist: Dr. Jesse Sans Problem List     Principal Problem:   Chest pain, rule out acute myocardial infarction Active Problems:   Status post coronary artery bypass grafting   Essential hypertension, benign   COPD (chronic obstructive pulmonary disease) ([facility])   Dyslipidemia   CAD (coronary artery disease)   Type 2 diabetes mellitus ([facility])   Dementia   GERD (gastroesophageal reflux disease)   Major depressive disorder, recurrent, severe without psychotic behavior ([place])   Diabetes mellitus with complication ([place])     Subjective   Feeling well.  Denies chest pain or shortness of breath.   Inpatient Medications    Scheduled Meds: . aspirin EC  81 mg Oral Daily  . buPROPion  150 mg Oral BID WC  . clopidogrel  75 mg Oral Daily  . donepezil  5 mg Oral QHS  . gabapentin  100 mg Oral TID  . Influenza vac split quadrivalent PF  0.5 mL Intramuscular Tomorrow-1000  . insulin aspart  0-9 Units Subcutaneous Q4H  . lisinopril  2.5 mg Oral Daily  . metoprolol tartrate  25 mg Oral BID  . multivitamin with minerals  1 tablet Oral Daily  . pantoprazole  40 mg Oral QPM  . pneumococcal 23 valent vaccine  0.5 mL Intramuscular Tomorrow-1000  . pravastatin  20 mg Oral q1800  . risperiDONE  1.5 mg Oral Daily  . sodium chloride flush  3 mL Intravenous Q12H  . traZODone  50 mg Oral QHS   Continuous Infusions: . sodium chloride 1 mL/kg/hr (04/15/16 0712)  . heparin 1,000 Units/hr (04/14/16 1810)  . nitroGLYCERIN 10 mcg/min (04/14/16 2230)   PRN Meds: sodium chloride, acetaminophen, morphine injection, ondansetron (ZOFRAN) IV, sodium chloride flush   Vital Signs    Vitals:   04/14/16 1400 04/15/16 0054 04/15/16 0444 04/15/16 0737  BP: 120/70 109/68 130/83 113/70  Pulse: 74 87 66 62  Resp:  '14 13 11  '$ Temp: 97.7 F (36.5 C) 97.8 F (36.6 C) 97.8 F (36.6 C) 98 F (36.7 C)  TempSrc: Oral Oral Oral  Oral  SpO2: 95% 95% 95% 95%  Weight:   82.1 kg (181 lb)   Height:        Intake/Output Summary (Last 24 hours) at 04/15/16 0925 Last data filed at 04/15/16 0439  Gross per 24 hour  Intake           430.83 ml  Output             1375 ml  Net          -944.17 ml   Filed Weights   04/13/16 2056 04/14/16 0823 04/15/16 0444  Weight: 83.9 kg (185 lb) 81.7 kg (180 lb 1.9 oz) 82.1 kg (181 lb)    Physical Exam    GEN: Well nourished, well developed, in no acute distress.  HEENT: Grossly normal.  Neck: Supple, no JVD, carotid bruits, or masses. Cardiac: RRR, no murmurs, rubs, or gallops. No clubbing, cyanosis, edema.  Radials/DP/PT 2+ and equal bilaterally.  Respiratory:  Respirations regular and unlabored, clear to auscultation bilaterally. GI: Soft, nontender, nondistended, BS + x 4. MS: no deformity or atrophy. Skin: warm and dry, no rash. Neuro:  Strength and sensation are intact. Psych: AAOx3.  Normal affect.  Labs    CBC  Recent Labs  04/13/16 2106 04/15/16 0500  WBC 8.6 9.4  HGB 12.6* 11.7*  HCT 38.2* 35.2*  MCV 89.7  89.3  PLT 278 433   Basic Metabolic Panel  Recent Labs  04/13/16 2106  NA 137  K 4.3  CL 100*  CO2 28  GLUCOSE 94  BUN 17  CREATININE 1.07  CALCIUM 9.9   Liver Function Tests No results for input(s): AST, ALT, ALKPHOS, BILITOT, PROT, ALBUMIN in the last 72 hours. No results for input(s): LIPASE, AMYLASE in the last 72 hours. Cardiac Enzymes  Recent Labs  04/14/16 0326 04/14/16 0905 04/14/16 1416  TROPONINI <0.03 <0.03 <0.03   BNP Invalid input(s): POCBNP D-Dimer No results for input(s): DDIMER in the last 72 hours. Hemoglobin A1C  Recent Labs  04/14/16 0905  HGBA1C 6.7*   Fasting Lipid Panel  Recent Labs  04/14/16 1021  CHOL 107  HDL 26*  LDLCALC 52  TRIG 144  CHOLHDL 4.1   Thyroid Function Tests No results for input(s): TSH, T4TOTAL, T3FREE, THYROIDAB in the last 72 hours.  Invalid input(s): FREET3  Telemetry     Sinus rhythm.  No events.  - Personally Reviewed  ECG    Sinus bradycardia.  Rate 59 bpm.  RBBB. - Personally Reviewed  Radiology    Dg Chest 2 View  Result Date: 04/13/2016 CLINICAL DATA:  Shortness of breath and chest pain EXAM: CHEST  2 VIEW COMPARISON:  April 26, 2014 FINDINGS: There is no edema or consolidation. The heart size and pulmonary vascularity are normal. No adenopathy. Patient is status post coronary artery bypass grafting. No pneumothorax. There is evidence of previous erosion of the lateral right clavicle with acromioclavicular separation on the right, stable. IMPRESSION: No edema or consolidation. Electronically Signed   By: Lowella Grip III M.D.   On: 04/13/2016 21:58   Nm Myocar Multi W/spect W/wall Motion / Ef  Result Date: 04/14/2016  There was no ST segment deviation noted during stress.  No T wave inversion was noted during stress.  Defect 1: There is a medium defect of moderate severity.  Findings consistent with ischemia.  The left ventricular ejection fraction is normal (55-65%).  This is an intermediate risk study.     Cardiac Studies   Lexiscan Myoview 04/14/16:  There was no ST segment deviation noted during stress.  No T wave inversion was noted during stress.  Defect 1: There is a medium defect of moderate severity.  Findings consistent with ischemia.  The left ventricular ejection fraction is normal (55-65%).  This is an intermediate risk study.   LHC 02/2012:  1. Moderately tight proximal to mid LAD stenosis with continued patency of the LIMA graft 2. Minimal stenosis in the left circumflex 3. Continued patency of the stented segment in the native right coronary artery 4. Continued patency of the saphenous vein graft to PLA as well as the stented segment in that vein graft 5. 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 Mild segmental LV dysfunction with estimated left ventricular ejection fraction  45%  Patient Profile     Mr. Paolo is a 75M with CAD s/p CABG and multiple PCIs, diabetes, hypertension, and hyperlipidemia here with chest pain and was found to have and abnormal nuclear stress test.   Assessment & Plan    # Unstable angina: Mr. Frazzini presented with chest pain and has a history of CABG (LIMA-->LAD, SVG-->RCA, SVG-->PLA, SVG-->PDA).  Vein graft to PDA is known to be occluded but other grafts were patent 02/2012.  He has a DES in SVG to PLA and in the RCA.  Stress test was  concerning for apical inferior, apical lateral and apical ischemia.  Plan for LHC today.  He is chest pain-free on heparin. Continue clopidogrel, aspirin, metoprolol, heparin drip, and pravastatin.    # Hypertension: Blood pressure is well-controlled on metoprolol and lisinopril.  # Hyperlipidemia:  LDL is 52 this admission. Continue pravastatin.     Signed, Skeet Latch, MD  04/15/2016, 9:25 AM

## 2016-04-15 NOTE — Progress Notes (Signed)
ANTICOAGULATION CONSULT NOTE - Follow-up Consult  Pharmacy Consult for heparin Indication: chest pain/ACS  Allergies  Allergen Reactions  . Bee Venom Anaphylaxis  . Codeine Shortness Of Breath and Other (See Comments)    Patient Measurements: Height: '5\' 8"'$  (172.7 cm) Weight: 180 lb 1.9 oz (81.7 kg) IBW/kg (Calculated) : 68.4 Heparin Dosing Weight: 81.7 kg  Vital Signs: Temp: 97.7 F (36.5 C) (10/19 1400) Temp Source: Oral (10/19 1400) BP: 120/70 (10/19 1400) Pulse Rate: 74 (10/19 1400)  Labs:  Recent Labs  04/13/16 2106 04/14/16 0326 04/14/16 0905 04/14/16 1416 04/14/16 2228  HGB 12.6*  --   --   --   --   HCT 38.2*  --   --   --   --   PLT 278  --   --   --   --   HEPARINUNFRC  --   --   --   --  0.59  CREATININE 1.07  --   --   --   --   TROPONINI  --  <0.03 <0.03 <0.03  --     Estimated Creatinine Clearance: 68.4 mL/min (by C-G formula based on SCr of 1.07 mg/dL).   Assessment: 65 yo male on heparin for r/o ACS. S/p myoview 10/18 which was abnormal, planning to proceed with cardiac cath today.  CBC wnl. Heparin level therapeutic (0.59) on gtt at 1000 units/hr. No bleeding noted.  Goal of Therapy:  Heparin level 0.3-0.7 units/ml Monitor platelets by anticoagulation protocol: Yes   Plan:  Continue heparin at 1000 units/hr F/u a.m. level to confirm  Sherlon Handing, PharmD, BCPS Clinical pharmacist, pager [phone] 04/15/2016,12:39 AM

## 2016-04-15 NOTE — Interval H&P Note (Signed)
Cath Lab Visit (complete for each Cath Lab visit)  Clinical Evaluation Leading to the Procedure:   ACS: Yes.    Non-ACS:    Anginal Classification: CCS [name]  Anti-ischemic medical therapy: Maximal Therapy (2 or more classes of medications)  Non-Invasive Test Results: Intermediate-risk stress test findings: cardiac mortality 1-3%/year  Prior CABG: Previous CABG      History and Physical Interval Note:  04/15/2016 2:05 PM  [name]  has presented today for surgery, with the diagnosis of abnormal stress test  The various methods of treatment have been discussed with the patient and family. After consideration of risks, benefits and other options for treatment, the patient has consented to  Procedure(s): Left Heart Cath and Cors/Grafts Angiography (N/A) as a surgical intervention .  The patient's history has been reviewed, patient examined, no change in status, stable for surgery.  I have reviewed the patient's chart and labs.  Questions were answered to the patient's satisfaction.     [name]

## 2016-04-15 NOTE — Progress Notes (Signed)
Patient Name: [name] Date of Encounter: 04/15/2016  Primary Cardiologist: Dr. Jesse Sans Problem List     Principal Problem:   Chest pain, rule out acute myocardial infarction Active Problems:   Status post coronary artery bypass grafting   Essential hypertension, benign   COPD (chronic obstructive pulmonary disease) (HCC)   Dyslipidemia   CAD (coronary artery disease)   Type 2 diabetes mellitus (HCC)   Dementia   GERD (gastroesophageal reflux disease)   Major depressive disorder, recurrent, severe without psychotic behavior ([place])   Diabetes mellitus with complication ([place])     Subjective   Feeling well.  Denies chest pain or shortness of breath.   Inpatient Medications    Scheduled Meds: . aspirin EC  81 mg Oral Daily  . buPROPion  150 mg Oral BID WC  . clopidogrel  75 mg Oral Daily  . donepezil  5 mg Oral QHS  . gabapentin  100 mg Oral TID  . Influenza vac split quadrivalent PF  0.5 mL Intramuscular Tomorrow-1000  . insulin aspart  0-9 Units Subcutaneous Q4H  . lisinopril  2.5 mg Oral Daily  . metoprolol tartrate  25 mg Oral BID  . multivitamin with minerals  1 tablet Oral Daily  . pantoprazole  40 mg Oral QPM  . pneumococcal 23 valent vaccine  0.5 mL Intramuscular Tomorrow-1000  . pravastatin  20 mg Oral q1800  . risperiDONE  1.5 mg Oral Daily  . sodium chloride flush  3 mL Intravenous Q12H  . traZODone  50 mg Oral QHS   Continuous Infusions: . sodium chloride 1 mL/kg/hr (04/15/16 0712)  . heparin 1,000 Units/hr (04/14/16 1810)  . nitroGLYCERIN 10 mcg/min (04/14/16 2230)   PRN Meds: sodium chloride, acetaminophen, morphine injection, ondansetron (ZOFRAN) IV, sodium chloride flush   Vital Signs    Vitals:   04/14/16 1400 04/15/16 0054 04/15/16 0444 04/15/16 0737  BP: 120/70 109/68 130/83 113/70  Pulse: 74 87 66 62  Resp:  '14 13 11  '$ Temp: 97.7 F (36.5 C) 97.8 F (36.6 C) 97.8 F (36.6 C) 98 F (36.7 C)  TempSrc: Oral Oral Oral  Oral  SpO2: 95% 95% 95% 95%  Weight:   82.1 kg (181 lb)   Height:        Intake/Output Summary (Last 24 hours) at 04/15/16 0925 Last data filed at 04/15/16 0439  Gross per 24 hour  Intake           430.83 ml  Output             1375 ml  Net          -944.17 ml   Filed Weights   04/13/16 2056 04/14/16 0823 04/15/16 0444  Weight: 83.9 kg (185 lb) 81.7 kg (180 lb 1.9 oz) 82.1 kg (181 lb)    Physical Exam    GEN: Well nourished, well developed, in no acute distress.  HEENT: Grossly normal.  Neck: Supple, no JVD, carotid bruits, or masses. Cardiac: RRR, no murmurs, rubs, or gallops. No clubbing, cyanosis, edema.  Radials/DP/PT 2+ and equal bilaterally.  Respiratory:  Respirations regular and unlabored, clear to auscultation bilaterally. GI: Soft, nontender, nondistended, BS + x 4. MS: no deformity or atrophy. Skin: warm and dry, no rash. Neuro:  Strength and sensation are intact. Psych: AAOx3.  Normal affect.  Labs    CBC  Recent Labs  04/13/16 2106 04/15/16 0500  WBC 8.6 9.4  HGB 12.6* 11.7*  HCT 38.2* 35.2*  MCV 89.7  89.3  PLT 278 161   Basic Metabolic Panel  Recent Labs  04/13/16 2106  NA 137  K 4.3  CL 100*  CO2 28  GLUCOSE 94  BUN 17  CREATININE 1.07  CALCIUM 9.9   Liver Function Tests No results for input(s): AST, ALT, ALKPHOS, BILITOT, PROT, ALBUMIN in the last 72 hours. No results for input(s): LIPASE, AMYLASE in the last 72 hours. Cardiac Enzymes  Recent Labs  04/14/16 0326 04/14/16 0905 04/14/16 1416  TROPONINI <0.03 <0.03 <0.03   BNP Invalid input(s): POCBNP D-Dimer No results for input(s): DDIMER in the last 72 hours. Hemoglobin A1C  Recent Labs  04/14/16 0905  HGBA1C 6.7*   Fasting Lipid Panel  Recent Labs  04/14/16 1021  CHOL 107  HDL 26*  LDLCALC 52  TRIG 144  CHOLHDL 4.1   Thyroid Function Tests No results for input(s): TSH, T4TOTAL, T3FREE, THYROIDAB in the last 72 hours.  Invalid input(s): FREET3  Telemetry     Sinus rhythm.  No events.  - Personally Reviewed  ECG    Sinus bradycardia.  Rate 59 bpm.  RBBB. - Personally Reviewed  Radiology    Dg Chest 2 View  Result Date: 04/13/2016 CLINICAL DATA:  Shortness of breath and chest pain EXAM: CHEST  2 VIEW COMPARISON:  April 26, 2014 FINDINGS: There is no edema or consolidation. The heart size and pulmonary vascularity are normal. No adenopathy. Patient is status post coronary artery bypass grafting. No pneumothorax. There is evidence of previous erosion of the lateral right clavicle with acromioclavicular separation on the right, stable. IMPRESSION: No edema or consolidation. Electronically Signed   By: Lowella Grip III M.D.   On: 04/13/2016 21:58   Nm Myocar Multi W/spect W/wall Motion / Ef  Result Date: 04/14/2016  There was no ST segment deviation noted during stress.  No T wave inversion was noted during stress.  Defect 1: There is a medium defect of moderate severity.  Findings consistent with ischemia.  The left ventricular ejection fraction is normal (55-65%).  This is an intermediate risk study.     Cardiac Studies   Lexiscan Myoview 04/14/16:  There was no ST segment deviation noted during stress.  No T wave inversion was noted during stress.  Defect 1: There is a medium defect of moderate severity.  Findings consistent with ischemia.  The left ventricular ejection fraction is normal (55-65%).  This is an intermediate risk study.   LHC 02/2012:  1. Moderately tight proximal to mid LAD stenosis with continued patency of the LIMA graft 2. Minimal stenosis in the left circumflex 3. Continued patency of the stented segment in the native right coronary artery 4. Continued patency of the saphenous vein graft to PLA as well as the stented segment in that vein graft 5. 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 Mild segmental LV dysfunction with estimated left ventricular ejection fraction  45%  Patient Profile     [name] is a 32M with CAD s/p CABG and multiple PCIs, diabetes, hypertension, and hyperlipidemia here with chest pain and was found to have and abnormal nuclear stress test.   Assessment & Plan    # Unstable angina: [name] presented with chest pain and has a history of CABG (LIMA-->LAD, SVG-->RCA, SVG-->PLA, SVG-->PDA).  Vein graft to PDA is known to be occluded but other grafts were patent 02/2012.  He has a DES in SVG to PLA and in the RCA.  Stress test was  concerning for apical inferior, apical lateral and apical ischemia.  Plan for LHC today.  He is chest pain-free on heparin. Continue clopidogrel, aspirin, metoprolol, heparin drip, and pravastatin.    # Hypertension: Blood pressure is well-controlled on metoprolol and lisinopril.  # Hyperlipidemia:  LDL is 52 this admission. Continue pravastatin.     Signed, Skeet Latch, MD  04/15/2016, 9:25 AM

## 2016-04-15 NOTE — Progress Notes (Signed)
Progress Note    [name]  ZOX:[id number] DOB: [birth year]  DOA: 04/14/2016 PCP: Monico Blitz, MD    Brief Narrative:   Chief complaint: Follow-up chest pain  [name] is an 65 y.o. male with a history of CAD s/p CABG with subsequent DES interventions to the SVG to PLA as well as native RCA, HTN, HLD, DM, mild dementia and GERD who presented 04/14/16 for evaluation of chest pain.  Assessment/Plan:   Principal Problem:   Chest pain/Unstable angina, rule out acute myocardial infarction/Coronary artery disease status post coronary artery bypass grafting in the past Has both typical and atypical features. Troponins negative. Evaluated by cardiologist with recommendations to proceed with Myoview which was done 04/14/16 and was concerning for apical inferior, apical lateral and apical ischemia.  Plan for LHC today. Continue aspirin, Plavix, metoprolol, lisinopril and pravastatin. Remains on heparin infusion.  Active Problems:   Essential hypertension, benign Controlled on current regimen.    COPD (chronic obstructive pulmonary disease) ([place]) Controlled.    Dyslipidemia Continue pravastatin and omega-3 fatty acids. LDL 52.    Type 2 diabetes mellitus (HCC)/diabetic neuropathy Metformin and Amaryl on hold. Continue Neurontin for neuropathy. Currently being managed with SSI every 4 hours. CBGs [id number].    Dementia Continue Aricept.    GERD (gastroesophageal reflux disease) Continue Protonix.     Major depressive disorder, recurrent, severe without psychotic behavior ([place]) Continue Wellbutrin, trazodone and Risperdal.  Family Communication/Anticipated D/C date and plan/Code Status   DVT prophylaxis: Heparin ordered. Code Status: Full Code.  Family Communication: Wife updated at the bedside. Disposition Plan: Home in 24-48 hours depending on heart catheterization results.   Medical Consultants:    Cardiology   Procedures:    Heart catheterization  04/15/16  Anti-Infectives:    None  Subjective:   The patient reports that he is currently chest pain-free, on a nitroglycerin drip. Review of symptoms is positive for mild shortness of breath and cough and negative for nausea, vomiting.  Objective:    Vitals:   04/14/16 1400 04/15/16 0054 04/15/16 0444 04/15/16 0737  BP: 120/70 109/68 130/83 113/70  Pulse: 74 87 66 62  Resp:  '14 13 11  '$ Temp: 97.7 F (36.5 C) 97.8 F (36.6 C) 97.8 F (36.6 C) 98 F (36.7 C)  TempSrc: Oral Oral Oral Oral  SpO2: 95% 95% 95% 95%  Weight:   82.1 kg (181 lb)   Height:        Intake/Output Summary (Last 24 hours) at 04/15/16 0827 Last data filed at 04/15/16 0439  Gross per 24 hour  Intake           460.83 ml  Output             1375 ml  Net          -914.17 ml   Filed Weights   04/13/16 2056 04/14/16 0823 04/15/16 0444  Weight: 83.9 kg (185 lb) 81.7 kg (180 lb 1.9 oz) 82.1 kg (181 lb)    Exam: General exam: Appears calm and comfortable.  Respiratory system: Clear to auscultation. Respiratory effort normal. Cardiovascular system: S1 & S2 heard, RRR. No JVD,  rubs, gallops or clicks. No murmurs. Gastrointestinal system: Abdomen is nondistended, soft and nontender. No organomegaly or masses felt. Normal bowel sounds heard. Central nervous system: Alert and oriented. No focal neurological deficits. Extremities: No clubbing,  or cyanosis. No edema. Skin: No rashes, lesions or ulcers. Psychiatry: Judgement and insight appear normal. Mood & affect appropriate.  Data Reviewed:   I have personally reviewed following labs and imaging studies:  Labs: Basic Metabolic Panel:  Recent Labs Lab 04/13/16 2106  NA 137  K 4.3  CL 100*  CO2 28  GLUCOSE 94  BUN 17  CREATININE 1.07  CALCIUM 9.9   GFR Estimated Creatinine Clearance: 73.9 mL/min (by C-G formula based on SCr of 1.07 mg/dL). Liver Function Tests: No results for input(s): AST, ALT, ALKPHOS, BILITOT, PROT, ALBUMIN in the last  168 hours. No results for input(s): LIPASE, AMYLASE in the last 168 hours. No results for input(s): AMMONIA in the last 168 hours. Coagulation profile  Recent Labs Lab 04/15/16 0500  INR 1.05    CBC:  Recent Labs Lab 04/13/16 2106 04/15/16 0500  WBC 8.6 9.4  HGB 12.6* 11.7*  HCT 38.2* 35.2*  MCV 89.7 89.3  PLT 278 238   Cardiac Enzymes:  Recent Labs Lab 04/14/16 0326 04/14/16 0905 04/14/16 1416  TROPONINI <0.03 <0.03 <0.03   CBG:  Recent Labs Lab 04/14/16 1636 04/14/16 2046 04/15/16 0053 04/15/16 0431 04/15/16 0757  GLUCAP 237* 153* 139* 171* 136*   D-Dimer: No results for input(s): DDIMER in the last 72 hours. Hgb A1c:  Recent Labs  04/14/16 0905  HGBA1C 6.7*   Lipid Profile:  Recent Labs  04/14/16 1021  CHOL 107  HDL 26*  LDLCALC 52  TRIG 144  CHOLHDL 4.1    Microbiology Recent Results (from the past 240 hour(s))  MRSA PCR Screening     Status: None   Collection Time: 04/14/16  8:22 AM  Result Value Ref Range Status   MRSA by PCR NEGATIVE NEGATIVE Final    Comment:        The GeneXpert MRSA Assay (FDA approved for NASAL specimens only), is one component of a comprehensive MRSA colonization surveillance program. It is not intended to diagnose MRSA infection nor to guide or monitor treatment for MRSA infections.     Radiology: Dg Chest 2 View  Result Date: 04/13/2016 CLINICAL DATA:  Shortness of breath and chest pain EXAM: CHEST  2 VIEW COMPARISON:  April 26, 2014 FINDINGS: There is no edema or consolidation. The heart size and pulmonary vascularity are normal. No adenopathy. Patient is status post coronary artery bypass grafting. No pneumothorax. There is evidence of previous erosion of the lateral right clavicle with acromioclavicular separation on the right, stable. IMPRESSION: No edema or consolidation. Electronically Signed   By: Lowella Grip III M.D.   On: 04/13/2016 21:58   Nm Myocar Multi W/spect W/wall Motion /  Ef  Result Date: 04/14/2016  There was no ST segment deviation noted during stress.  No T wave inversion was noted during stress.  Defect 1: There is a medium defect of moderate severity.  Findings consistent with ischemia.  The left ventricular ejection fraction is normal (55-65%).  This is an intermediate risk study.     Medications:   . aspirin EC  81 mg Oral Daily  . buPROPion  150 mg Oral BID WC  . clopidogrel  75 mg Oral Daily  . donepezil  5 mg Oral QHS  . gabapentin  100 mg Oral TID  . Influenza vac split quadrivalent PF  0.5 mL Intramuscular Tomorrow-1000  . insulin aspart  0-9 Units Subcutaneous Q4H  . lisinopril  2.5 mg Oral Daily  . metoprolol tartrate  25 mg Oral BID  . multivitamin with minerals  1 tablet Oral Daily  . pantoprazole  40 mg Oral QPM  .  pneumococcal 23 valent vaccine  0.5 mL Intramuscular Tomorrow-1000  . pravastatin  20 mg Oral q1800  . risperiDONE  1.5 mg Oral Daily  . sodium chloride flush  3 mL Intravenous Q12H  . traZODone  50 mg Oral QHS   Continuous Infusions: . sodium chloride 1 mL/kg/hr (04/15/16 0712)  . heparin 1,000 Units/hr (04/14/16 1810)  . nitroGLYCERIN 10 mcg/min (04/14/16 2230)    Medical decision making is of high complexity and this patient is at high risk of deterioration, therefore this is a level 3 visit.     LOS: 0 days   RAMA,CHRISTINA  [facility] Pager [phone]. If unable to reach me by pager, please call my cell phone at [phone].  *Please refer to [email or web address], password [email or web address] to get updated schedule on who will round on this patient, as hospitalists switch teams weekly. If 7PM-7AM, please contact night-coverage at [email or web address], password [email or web address] for any overnight needs.  04/15/2016, 8:27 AM

## 2016-04-15 NOTE — Progress Notes (Signed)
ANTICOAGULATION CONSULT NOTE - Follow-up Consult  Pharmacy Consult for heparin Indication: chest pain/ACS  Assessment: 65 yo male on heparin for r/o ACS. S/p myoview 10/18 which was abnormal, planning to proceed with cardiac cath today.  CBC wnl. Heparin level therapeutic (0.39) on gtt at 1000 units/hr. No bleeding noted.  Goal of Therapy:  Heparin level 0.3-0.7 units/ml Monitor platelets by anticoagulation protocol: Yes   Plan:  Continue heparin at 1000 units/hr Follow up after cath  Allergies  Allergen Reactions  . Bee Venom Anaphylaxis  . Codeine Shortness Of Breath and Other (See Comments)    Patient Measurements: Height: '5\' 8"'$  (172.7 cm) Weight: 181 lb (82.1 kg) IBW/kg (Calculated) : 68.4 Heparin Dosing Weight: 81.7 kg  Vital Signs: Temp: 98 F (36.7 C) (10/20 0737) Temp Source: Oral (10/20 0737) BP: 113/70 (10/20 0737) Pulse Rate: 62 (10/20 0737)  Labs:  Recent Labs  04/13/16 2106 04/14/16 0326 04/14/16 0905 04/14/16 1416 04/14/16 2228 04/15/16 0500  HGB 12.6*  --   --   --   --  11.7*  HCT 38.2*  --   --   --   --  35.2*  PLT 278  --   --   --   --  238  LABPROT  --   --   --   --   --  13.7  INR  --   --   --   --   --  1.05  HEPARINUNFRC  --   --   --   --  0.59 0.39  CREATININE 1.07  --   --   --   --   --   TROPONINI  --  <0.03 <0.03 <0.03  --   --     Estimated Creatinine Clearance: 73.9 mL/min (by C-G formula based on SCr of 1.07 mg/dL).  Erin Hearing PharmD., BCPS Clinical Pharmacist Pager [phone] 04/15/2016 8:53 AM

## 2016-04-16 DIAGNOSIS — R079 Chest pain, unspecified: Secondary | ICD-10-CM | POA: Diagnosis not present

## 2016-04-16 DIAGNOSIS — I208 Other forms of angina pectoris: Secondary | ICD-10-CM | POA: Diagnosis not present

## 2016-04-16 DIAGNOSIS — J449 Chronic obstructive pulmonary disease, unspecified: Secondary | ICD-10-CM | POA: Diagnosis not present

## 2016-04-16 DIAGNOSIS — I200 Unstable angina: Secondary | ICD-10-CM | POA: Diagnosis not present

## 2016-04-16 DIAGNOSIS — I2510 Atherosclerotic heart disease of native coronary artery without angina pectoris: Secondary | ICD-10-CM | POA: Diagnosis not present

## 2016-04-16 LAB — CBC
HEMATOCRIT: 35.4 % — AB (ref 39.0–52.0)
HEMOGLOBIN: 11.7 g/dL — AB (ref 13.0–17.0)
MCH: 29.4 pg (ref 26.0–34.0)
MCHC: 33.1 g/dL (ref 30.0–36.0)
MCV: 88.9 fL (ref 78.0–100.0)
Platelets: 238 10*3/uL (ref 150–400)
RBC: 3.98 MIL/uL — ABNORMAL LOW (ref 4.22–5.81)
RDW: 13.1 % (ref 11.5–15.5)
WBC: 6.6 10*3/uL (ref 4.0–10.5)

## 2016-04-16 LAB — BASIC METABOLIC PANEL
Anion gap: 6 (ref 5–15)
BUN: 13 mg/dL (ref 6–20)
CO2: 26 mmol/L (ref 22–32)
CREATININE: 0.94 mg/dL (ref 0.61–1.24)
Calcium: 8.9 mg/dL (ref 8.9–10.3)
Chloride: 103 mmol/L (ref 101–111)
GFR calc Af Amer: >60 mL/min (ref >60)
GLUCOSE: 174 mg/dL — AB (ref 65–99)
POTASSIUM: 4 mmol/L (ref 3.5–5.1)
SODIUM: 135 mmol/L (ref 135–145)

## 2016-04-16 LAB — GLUCOSE, CAPILLARY
GLUCOSE-CAPILLARY: 152 mg/dL — AB (ref 65–99)
Glucose-Capillary: 161 mg/dL — ABNORMAL HIGH (ref 65–99)
Glucose-Capillary: 178 mg/dL — ABNORMAL HIGH (ref 65–99)
Glucose-Capillary: 98 mg/dL (ref 65–99)

## 2016-04-16 MED ORDER — METFORMIN HCL 1000 MG PO TABS: 1000.0000 mg | ORAL_TABLET | Freq: Two times a day (BID) | ORAL | Status: AC

## 2016-04-16 NOTE — Discharge Instructions (Addendum)
Chest Wall Pain Chest wall pain is pain in or around the bones and muscles of your chest. Sometimes, an injury causes this pain. Sometimes, the cause may not be known. This pain may take several weeks or longer to get better. HOME CARE Pay attention to any changes in your symptoms. Take these actions to help with your pain:  Rest as told by your doctor.  Avoid activities that cause pain. Try not to use your chest, belly (abdominal), or side muscles to lift heavy things.  If directed, apply ice to the painful area:  Put ice in a plastic bag.  Place a towel between your skin and the bag.  Leave the ice on for 20 minutes, 2-3 times per day.  Take over-the-counter and prescription medicines only as told by your doctor.  Do not use tobacco products, including cigarettes, chewing tobacco, and e-cigarettes. If you need help quitting, ask your doctor.  Keep all follow-up visits as told by your doctor. This is important. GET HELP IF:  You have a fever.  Your chest pain gets worse.  You have new symptoms. GET HELP RIGHT AWAY IF:  You feel sick to your stomach (nauseous) or you throw up (vomit).  You feel sweaty or light-headed.  You have a cough with phlegm (sputum) or you cough up blood.  You are short of breath.   This information is not intended to replace advice given to you by your health care provider. Make sure you discuss any questions you have with your health care provider.   Document Released: 11/30/2007 Document Revised: 03/04/2015 Document Reviewed: 09/08/2014 Elsevier Interactive Patient Education 2016 [place] After These instructions give you information about caring for yourself after your procedure. Your doctor may also give you more specific instructions. Call your doctor if you have any problems or questions after your procedure.  HOME CARE  Take medicines only as told by your doctor.  Follow your doctor's instructions  about:  Care of the area where the tube was inserted.  Bandage (dressing) changes and removal.  You may shower 24-48 hours after the procedure or as told by your doctor.  Do not take baths, swim, or use a hot tub until your doctor approves.  Every day, check the area where the tube was inserted. Watch for:  Redness, swelling, or pain.  Fluid, blood, or pus.  Do not apply powder or lotion to the site.  Do not lift anything that is heavier than 10 lb (4.5 kg) for 5 days or as told by your doctor.  Ask your doctor when you can:  Return to work or school.  Do physical activities or play sports.  Have sex.  Do not drive or operate heavy machinery for 24 hours or as told by your doctor.  Have someone with you for the first 24 hours after the procedure.  Keep all follow-up visits as told by your doctor. This is important. GET HELP IF:  You have a fever.   You have chills.   You have more bleeding from the area where the tube was inserted. Hold pressure on the area.  You have redness, swelling, or pain in the area where the tube was inserted.  You have fluid or pus coming from the area. GET HELP RIGHT AWAY IF:   You have a lot of pain in the area where the tube was inserted.  The area where the tube was inserted is bleeding, and the bleeding does  not stop after 30 minutes of holding steady pressure on the area.  The area near or just beyond the insertion site becomes pale, cool, tingly, or numb.   This information is not intended to replace advice given to you by your health care provider. Make sure you discuss any questions you have with your health care provider.   Document Released: 09/09/2008 Document Revised: 07/04/2014 Document Reviewed: 11/14/2012 Elsevier Interactive Patient Education 2016 [employer].   Diabetes Mellitus and Food It is important for you to manage your blood sugar (glucose) level. Your blood glucose level can be greatly affected by what  you eat. Eating healthier foods in the appropriate amounts throughout the day at about the same time each day will help you control your blood glucose level. It can also help slow or prevent worsening of your diabetes mellitus. Healthy eating may even help you improve the level of your blood pressure and reach or maintain a healthy weight.  General recommendations for healthful eating and cooking habits include:  Eating meals and snacks regularly. Avoid going long periods of time without eating to lose weight.  Eating a diet that consists mainly of plant-based foods, such as fruits, vegetables, nuts, legumes, and whole grains.  Using low-heat cooking methods, such as baking, instead of high-heat cooking methods, such as deep frying. Work with your dietitian to make sure you understand how to use the Nutrition Facts information on food labels. HOW CAN FOOD AFFECT ME? Carbohydrates Carbohydrates affect your blood glucose level more than any other type of food. Your dietitian will help you determine how many carbohydrates to eat at each meal and teach you how to count carbohydrates. Counting carbohydrates is important to keep your blood glucose at a healthy level, especially if you are using insulin or taking certain medicines for diabetes mellitus. Alcohol Alcohol can cause sudden decreases in blood glucose (hypoglycemia), especially if you use insulin or take certain medicines for diabetes mellitus. Hypoglycemia can be a life-threatening condition. Symptoms of hypoglycemia (sleepiness, dizziness, and disorientation) are similar to symptoms of having too much alcohol.  If your health care provider has given you approval to drink alcohol, do so in moderation and use the following guidelines:  Women should not have more than one drink per day, and men should not have more than two drinks per day. One drink is equal to:  12 oz of beer.  5 oz of wine.  1 oz of hard liquor.  Do not drink on an  empty stomach.  Keep yourself hydrated. Have water, diet soda, or unsweetened iced tea.  Regular soda, juice, and other mixers might contain a lot of carbohydrates and should be counted. WHAT FOODS ARE NOT RECOMMENDED? As you make food choices, it is important to remember that all foods are not the same. Some foods have fewer nutrients per serving than other foods, even though they might have the same number of calories or carbohydrates. It is difficult to get your body what it needs when you eat foods with fewer nutrients. Examples of foods that you should avoid that are high in calories and carbohydrates but low in nutrients include:  Trans fats (most processed foods list trans fats on the Nutrition Facts label).  Regular soda.  Juice.  Candy.  Sweets, such as cake, pie, doughnuts, and cookies.  Fried foods. WHAT FOODS CAN I EAT? Eat nutrient-rich foods, which will nourish your body and keep you healthy. The food you should eat also will depend on several  factors, including:  The calories you need.  The medicines you take.  Your weight.  Your blood glucose level.  Your blood pressure level.  Your cholesterol level. You should eat a variety of foods, including:  Protein.  Lean cuts of meat.  Proteins low in saturated fats, such as fish, egg whites, and beans. Avoid processed meats.  Fruits and vegetables.  Fruits and vegetables that may help control blood glucose levels, such as apples, mangoes, and yams.  Dairy products.  Choose fat-free or low-fat dairy products, such as milk, yogurt, and cheese.  Grains, bread, pasta, and rice.  Choose whole grain products, such as multigrain bread, whole oats, and brown rice. These foods may help control blood pressure.  Fats.  Foods containing healthful fats, such as nuts, avocado, olive oil, canola oil, and fish. DOES EVERYONE WITH DIABETES MELLITUS HAVE THE SAME MEAL PLAN? Because every person with diabetes mellitus is  different, there is not one meal plan that works for everyone. It is very important that you meet with a dietitian who will help you create a meal plan that is just right for you.   This information is not intended to replace advice given to you by your health care provider. Make sure you discuss any questions you have with your health care provider.   Document Released: 03/10/2005 Document Revised: 07/04/2014 Document Reviewed: 05/10/2013 Elsevier Interactive Patient Education [employer].

## 2016-04-16 NOTE — Progress Notes (Signed)
Pt ambulated to the bathroom and back to bed with stand by assistance. Rt femoral site is a level "0". No complications or bleeding at this time. Will continue to monitor and assess.

## 2016-04-16 NOTE — Progress Notes (Signed)
Walked with patient from room to nurses station and back to room.  Patient denies chest pain.

## 2016-04-16 NOTE — Progress Notes (Signed)
Patient Name: [name] Date of Encounter: 04/16/2016  Primary Cardiologist: Dr. Jesse [name] Problem List     Principal Problem:   Chest pain Active Problems:   Status post coronary artery bypass grafting   Essential hypertension, benign   COPD (chronic obstructive pulmonary disease) (HCC)   Dyslipidemia   CAD (coronary artery disease)   Type 2 diabetes mellitus (HCC)   Dementia   GERD (gastroesophageal reflux disease)   Major depressive disorder, recurrent, severe without psychotic behavior ([place])   Chest pain, rule out acute myocardial infarction   Diabetes mellitus with complication (HCC)   Unstable angina ([place])   Hypertensive heart disease with heart failure (HCC)     Subjective   Feeling well.  Denies chest pain or shortness of breath.  Cath on 10/20 shows occlusion of the SVG to the PDA and atresia of the LIMA to LAD SVG to RCA ventricular branch is patent  LAD has a prox 50%, 2nd diag has 50%.   Mild - Moderate LV dysfunction with EF 35-40%.  Inpatient Medications    Scheduled Meds: . aspirin EC  81 mg Oral Daily  . buPROPion  150 mg Oral BID WC  . clopidogrel  75 mg Oral Daily  . donepezil  5 mg Oral QHS  . gabapentin  100 mg Oral TID  . heparin  5,000 Units Subcutaneous Q8H  . Influenza vac split quadrivalent PF  0.5 mL Intramuscular Tomorrow-1000  . insulin aspart  0-9 Units Subcutaneous Q4H  . lisinopril  2.5 mg Oral Daily  . metoprolol tartrate  25 mg Oral BID  . multivitamin with minerals  1 tablet Oral Daily  . pantoprazole  40 mg Oral QPM  . pneumococcal 23 valent vaccine  0.5 mL Intramuscular Tomorrow-1000  . pravastatin  20 mg Oral q1800  . risperiDONE  1.5 mg Oral Daily  . sodium chloride flush  3 mL Intravenous Q12H  . traZODone  50 mg Oral QHS   Continuous Infusions:   PRN Meds: sodium chloride, acetaminophen, morphine injection, ondansetron (ZOFRAN) IV, sodium chloride flush   Vital Signs    Vitals:   04/15/16 2023  04/16/16 0029 04/16/16 0442 04/16/16 0800  BP: (!) 141/93 126/78 113/66 137/87  Pulse: 61 (!) 53  (!) 58  Resp: '12 11  14  '$ Temp: 98.7 F (37.1 C) 98.7 F (37.1 C) 98.3 F (36.8 C) 98.1 F (36.7 C)  TempSrc: Oral Oral Oral Oral  SpO2: 96% 95%  97%  Weight:   180 lb 6.4 oz (81.8 kg)   Height:        Intake/Output Summary (Last 24 hours) at 04/16/16 1023 Last data filed at 04/16/16 0905  Gross per 24 hour  Intake          1626.04 ml  Output             1750 ml  Net          -123.96 ml   Filed Weights   04/14/16 0823 04/15/16 0444 04/16/16 0442  Weight: 180 lb 1.9 oz (81.7 kg) 181 lb (82.1 kg) 180 lb 6.4 oz (81.8 kg)    Physical Exam    GEN: Well nourished, well developed, in no acute distress.  HEENT: Grossly normal.  Neck: Supple, no JVD, carotid bruits, or masses. Cardiac: RRR, no murmurs, rubs, or gallops. No clubbing, cyanosis, edema.  Radials/DP/PT 2+ and equal bilaterally.  Respiratory:  Respirations regular and unlabored, clear to auscultation bilaterally. GI: Soft, nontender,  nondistended, BS + x 4. MS: no deformity or atrophy.  Left radial cath site and RFA cath site look good  Skin: warm and dry, no rash. Neuro:  Strength and sensation are intact. Psych: AAOx3.  Normal affect.  Labs    CBC  Recent Labs  04/15/16 0500 04/16/16 0503  WBC 9.4 6.6  HGB 11.7* 11.7*  HCT 35.2* 35.4*  MCV 89.3 88.9  PLT 238 662   Basic Metabolic Panel  Recent Labs  04/13/16 2106 04/16/16 0503  NA 137 135  K 4.3 4.0  CL 100* 103  CO2 28 26  GLUCOSE 94 174*  BUN 17 13  CREATININE 1.07 0.94  CALCIUM 9.9 8.9   Liver Function Tests No results for input(s): AST, ALT, ALKPHOS, BILITOT, PROT, ALBUMIN in the last 72 hours. No results for input(s): LIPASE, AMYLASE in the last 72 hours. Cardiac Enzymes  Recent Labs  04/14/16 0326 04/14/16 0905 04/14/16 1416  TROPONINI <0.03 <0.03 <0.03   BNP Invalid input(s): POCBNP D-Dimer No results for input(s): DDIMER in  the last 72 hours. Hemoglobin A1C  Recent Labs  04/14/16 0905  HGBA1C 6.7*   Fasting Lipid Panel  Recent Labs  04/14/16 1021  CHOL 107  HDL 26*  LDLCALC 52  TRIG 144  CHOLHDL 4.1   Thyroid Function Tests No results for input(s): TSH, T4TOTAL, T3FREE, THYROIDAB in the last 72 hours.  Invalid input(s): FREET3  Telemetry    Sinus rhythm.  No events.  - Personally Reviewed  ECG    Sinus bradycardia.  Rate 59 bpm.  RBBB. - Personally Reviewed  Radiology    Nm Myocar Multi W/spect W/wall Motion / Ef  Result Date: 04/14/2016  There was no ST segment deviation noted during stress.  No T wave inversion was noted during stress.  Defect 1: There is a medium defect of moderate severity.  Findings consistent with ischemia.  The left ventricular ejection fraction is normal (55-65%).  This is an intermediate risk study.     Cardiac Studies   Lexiscan Myoview 04/14/16:  There was no ST segment deviation noted during stress.  No T wave inversion was noted during stress.  Defect 1: There is a medium defect of moderate severity.  Findings consistent with ischemia.  The left ventricular ejection fraction is normal (55-65%).  This is an intermediate risk study.   LHC 02/2012:  1. Moderately tight proximal to mid LAD stenosis with continued patency of the LIMA graft 2. Minimal stenosis in the left circumflex 3. Continued patency of the stented segment in the native right coronary artery 4. Continued patency of the saphenous vein graft to PLA as well as the stented segment in that vein graft 5. 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 Mild segmental LV dysfunction with estimated left ventricular ejection fraction 45%  Patient Profile     [name] is a 70M with CAD s/p CABG and multiple PCIs, diabetes, hypertension, and hyperlipidemia here with chest pain and was found to have and abnormal nuclear stress test.   Assessment & Plan    # Unstable  angina: [name] presented with chest pain and has a history of CABG (LIMA-->LAD, SVG-->RCA, SVG-->PLA, SVG-->PDA).  Vein graft to PDA is known to be occluded but other grafts were patent 02/2012. Has had occlusion of the SVG to the PDA and the LIMA is now atretic. Continue with medical management. Will ambulate today and anticipate DC if he does well with ambulation   #  Hypertension: Blood pressure is well-controlled on metoprolol and lisinopril.  # Hyperlipidemia:  LDL is 52 this admission. Continue pravastatin.     Signed, Mertie Moores, MD  04/16/2016, 10:23 AM

## 2016-04-16 NOTE — Discharge Summary (Signed)
Physician Discharge Summary  [name] ALP:[id number] DOB: January 03, 1951 DOA: 04/14/2016  PCP: Monico Blitz, MD  Admit date: 04/14/2016 Discharge date: 04/16/2016  Admitted From: Home Discharge disposition: Home   Recommendations for Outpatient Follow-Up:   1. F/U with cardiology in 3 weeks.   Discharge Diagnosis:   Principal Problem:    Unstable angina (HCC) Active Problems:    Status post coronary artery bypass grafting    Essential hypertension, benign    COPD (chronic obstructive pulmonary disease) (HCC)    Dyslipidemia    Chest pain    CAD (coronary artery disease)    Type 2 diabetes mellitus (HCC)    Dementia    GERD (gastroesophageal reflux disease)    Major depressive disorder, recurrent, severe without psychotic behavior ([place])    Chest pain, rule out acute myocardial infarction    Diabetes mellitus with complication ([place])    Hypertensive heart disease with heart failure ([place])   Discharge Condition: Improved.  Diet recommendation: Low sodium, heart healthy.  Carbohydrate-modified.    History of Present Illness:   [name] is an 65 y.o. male with a history of CAD s/p CABG with subsequent DES interventions to the SVG to PLA as well as native RCA, HTN, HLD, DM, mild dementia and GERD who presented 04/14/16 for evaluation of chest pain.  Hospital Course by Problem:   Principal Problem:   Chest pain/Unstable angina, rule out acute myocardial infarction/Coronary artery disease status post coronary artery bypass grafting in the past Had both typical and atypical features. Troponins were negative. Evaluated by cardiologist with recommendations to proceed with Myoview which was done 04/14/16 and was concerning for apical inferior, apical lateral and apical ischemia. Status post LHC 04/15/16. Bypass graft failure with occlusion of the SVG to the PDA and atresia of the LIMA to the LAD noted, but no change from prior study. Medical therapy recommended.  Continue aspirin, Plavix, metoprolol, lisinopril and pravastatin. Heparin discontinued. Spoke with Dr. Acie Fredrickson about the patient's plan of care, cleared for D/C with recommendations to F/U with Dr. Domenic Polite in 3 weeks.  Active Problems:   Essential hypertension, benign Controlled on current regimen.    COPD (chronic obstructive pulmonary disease) ([place]) Controlled.    Dyslipidemia Continue pravastatin and omega-3 fatty acids. LDL 52.    Type 2 diabetes mellitus (HCC)/diabetic neuropathy Metformin and Amaryl on hold. Continue Neurontin for neuropathy. Currently being managed with SSI every 4 hours. CBGs 98-178. Resume Amaryl at D/C.  Hold Metformin another 24 hours.    Dementia Continue Aricept.    GERD (gastroesophageal reflux disease) Continue Protonix.     Major depressive disorder, recurrent, severe without psychotic behavior ([place]) Continue Wellbutrin, trazodone and Risperdal.   Medical Consultants:    Cardiology   Discharge Exam:   Vitals:   04/16/16 0800 04/16/16 1023  BP: 137/87 138/66  Pulse: (!) 58 70  Resp: 14   Temp: 98.1 F (36.7 C)    Vitals:   04/16/16 0029 04/16/16 0442 04/16/16 0800 04/16/16 1023  BP: 126/78 113/66 137/87 138/66  Pulse: (!) 53  (!) 58 70  Resp: 11  14   Temp: 98.7 F (37.1 C) 98.3 F (36.8 C) 98.1 F (36.7 C)   TempSrc: Oral Oral Oral   SpO2: 95%  97%   Weight:  81.8 kg (180 lb 6.4 oz)    Height:        General exam: Appears calm and comfortable.  Respiratory system: Clear to auscultation. Respiratory effort normal. Cardiovascular system: S1 &  S2 heard, RRR. No JVD,  rubs, gallops or clicks. No murmurs. Gastrointestinal system: Abdomen is nondistended, soft and nontender. No organomegaly or masses felt. Normal bowel sounds heard. Central nervous system: Alert and oriented. No focal neurological deficits. Extremities: No clubbing,  or cyanosis. No edema. Skin: No rashes, lesions or ulcers. Psychiatry: Judgement and  insight appear normal. Mood & affect appropriate.   The results of significant diagnostics from this hospitalization (including imaging, microbiology, ancillary and laboratory) are listed below for reference.     Procedures and Diagnostic Studies:   Nm Myocar Multi W/spect W/wall Motion / Ef  Result Date: 04/14/2016  There was no ST segment deviation noted during stress.  No T wave inversion was noted during stress.  Defect 1: There is a medium defect of moderate severity.  Findings consistent with ischemia.  The left ventricular ejection fraction is normal (55-65%).  This is an intermediate risk study.    LHC 02/2012:  1. Moderately tight proximal to mid LAD stenosis with continued patency of the LIMA graft 2. Minimal stenosis in the left circumflex 3. Continued patency of the stented segment in the native right coronary artery 4. Continued patency of the saphenous vein graft to PLA as well as the stented segment in that vein graft 5. 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 Mild segmental LV dysfunction with estimated left ventricular ejection fraction 45%  Labs:   Basic Metabolic Panel:  Recent Labs Lab 04/13/16 2106 04/16/16 0503  NA 137 135  K 4.3 4.0  CL 100* 103  CO2 28 26  GLUCOSE 94 174*  BUN 17 13  CREATININE 1.07 0.94  CALCIUM 9.9 8.9   GFR Estimated Creatinine Clearance: 77.8 mL/min (by C-G formula based on SCr of 0.94 mg/dL). Liver Function Tests: No results for input(s): AST, ALT, ALKPHOS, BILITOT, PROT, ALBUMIN in the last 168 hours. No results for input(s): LIPASE, AMYLASE in the last 168 hours. No results for input(s): AMMONIA in the last 168 hours. Coagulation profile  Recent Labs Lab 04/15/16 0500  INR 1.05    CBC:  Recent Labs Lab 04/13/16 2106 04/15/16 0500 04/16/16 0503  WBC 8.6 9.4 6.6  HGB 12.6* 11.7* 11.7*  HCT 38.2* 35.2* 35.4*  MCV 89.7 89.3 88.9  PLT 278 238 238   Cardiac Enzymes:  Recent Labs Lab  04/14/16 0326 04/14/16 0905 04/14/16 1416  TROPONINI <0.03 <0.03 <0.03   CBG:  Recent Labs Lab 04/15/16 2032 04/16/16 0029 04/16/16 0440 04/16/16 0720 04/16/16 1130  GLUCAP 137* 98 178* 161* 152*   Hgb A1c  Recent Labs  04/14/16 0905  HGBA1C 6.7*   Lipid Profile  Recent Labs  04/14/16 1021  CHOL 107  HDL 26*  LDLCALC 52  TRIG 144  CHOLHDL 4.1   Microbiology Recent Results (from the past 240 hour(s))  MRSA PCR Screening     Status: None   Collection Time: 04/14/16  8:22 AM  Result Value Ref Range Status   MRSA by PCR NEGATIVE NEGATIVE Final    Comment:        The GeneXpert MRSA Assay (FDA approved for NASAL specimens only), is one component of a comprehensive MRSA colonization surveillance program. It is not intended to diagnose MRSA infection nor to guide or monitor treatment for MRSA infections.      Discharge Instructions:   Discharge Instructions    (HEART FAILURE PATIENTS) Call MD:  Anytime you have any of the following symptoms: 1) 3 pound weight gain  in 24 hours or 5 pounds in 1 week 2) shortness of breath, with or without a dry hacking cough 3) swelling in the hands, feet or stomach 4) if you have to sleep on extra pillows at night in order to breathe.    Complete by:  As directed    Call MD for:  extreme fatigue    Complete by:  As directed    Call MD for:  persistant dizziness or light-headedness    Complete by:  As directed    Call MD for:  persistant nausea and vomiting    Complete by:  As directed    Call MD for:  severe uncontrolled pain    Complete by:  As directed    Diet - low sodium heart healthy    Complete by:  As directed    Increase activity slowly    Complete by:  As directed        Medication List    TAKE these medications   aspirin 81 MG EC tablet Take 81 mg by mouth daily.   Biotin 1000 MCG tablet Take 1,000 mcg by mouth daily.   buPROPion 150 MG 12 hr tablet Commonly known as:  WELLBUTRIN SR Take 1 tablet  (150 mg total) by mouth 2 (two) times daily.   clopidogrel 75 MG tablet Commonly known as:  PLAVIX Take 75 mg by mouth daily.   donepezil 5 MG tablet Commonly known as:  ARICEPT Take 1 tablet (5 mg total) by mouth at bedtime.   FISH OIL PO Take 1,400 mg by mouth daily.   gabapentin 100 MG capsule Commonly known as:  NEURONTIN Take 100 mg by mouth 3 (three) times daily.   glimepiride 4 MG tablet Commonly known as:  AMARYL Take 4 mg by mouth daily with breakfast.   lisinopril 2.5 MG tablet Commonly known as:  PRINIVIL,ZESTRIL Take 2.5 mg by mouth daily.   metFORMIN 1000 MG tablet Commonly known as:  GLUCOPHAGE Take 1 tablet (1,000 mg total) by mouth 2 (two) times daily with a meal. HOLD 10/21.  May resume on 10/22. What changed:  additional instructions   metoprolol tartrate 25 MG tablet Commonly known as:  LOPRESSOR Take 25 mg by mouth 2 (two) times daily.   multivitamin with minerals Tabs tablet Take 1 tablet by mouth daily.   nitroGLYCERIN 0.4 MG SL tablet Commonly known as:  NITROSTAT Place 1 tablet (0.4 mg total) under the tongue every 5 (five) minutes as needed. For chest pain   pantoprazole 40 MG tablet Commonly known as:  PROTONIX Take 40 mg by mouth every evening.   pravastatin 20 MG tablet Commonly known as:  PRAVACHOL Take 20 mg by mouth daily.   risperiDONE 1 MG tablet Commonly known as:  RISPERDAL Take 1.5 tablets (1.5 mg total) by mouth daily. Taking 1.5 Tablets QD What changed:  additional instructions   traZODone 50 MG tablet Commonly known as:  DESYREL Take 1 tablet (50 mg total) by mouth at bedtime.   VITAMIN C PO Take 1 tablet by mouth daily.   zinc gluconate 50 MG tablet Take 50 mg by mouth daily.      Follow-up Information    Rozann Lesches, MD. Schedule an appointment as soon as possible for a visit in 3 week(s).   Specialty:  Cardiology Why:  Hospital follow up. Contact information: [place]  [id number] [phone]            Time coordinating discharge: 25  minutes.  Signed:  RAMA,CHRISTINA  Pager [phone] [facility] 04/16/2016, 11:33 AM

## 2016-04-18 ENCOUNTER — Encounter (HOSPITAL_COMMUNITY): Payer: Self-pay | Admitting: Interventional Cardiology

## 2016-04-18 MED FILL — Verapamil HCl IV Soln 2.5 MG/ML: INTRAVENOUS | Qty: 2 | Status: AC

## 2017-01-19 ENCOUNTER — Telehealth: Payer: Self-pay | Admitting: Cardiology

## 2017-01-19 NOTE — Telephone Encounter (Signed)
Numerous attempts to contact patient with recall letters. Unable to reach by telephone. with no success.  User: Time: Status:    Chanda Busing [[id number]] 06/25/2015 11:32 AM New [10]   [System] 02/26/2016 11:04 PM Notification Sent [20]   Chanda Busing [[id number]] 09/09/2016 9:38 AM Notification Sent [20]   Chanda Busing [[id number]] 11/18/2016 11:37 AM Notification Sent [20]   Weston Anna [[id number]] 12/08/2016 12:05 PM Notification Sent [20]

## 2017-07-31 ENCOUNTER — Encounter: Payer: Self-pay | Admitting: Neurology

## 2017-08-24 NOTE — Progress Notes (Signed)
[name] was seen today in the movement disorders clinic for neurologic consultation at the request of Monico Blitz, MD.  This patient is accompanied in the office by his spouse who supplements the history.  The consultation is for the evaluation of tremor.  The records that were made available to me were reviewed.  Pt reports tremor onset about 9 months ago.  Tremor started in the right arm but about 6 months ago he noted it occasionally on the L hand.  Tremor is most evident when he is at rest.  Patient is on Risperdal and has been on that medication since 09/2016 per local pharmacy records.  He was on 2 mg daily, but has been cutting this back because they do not think it is effective anyway.  He is currently on 0.5 mg daily.  Specific Symptoms:  Tremor: Yes.   Family hx of similar:  No. Voice: somewhat softer intermittently per wife Sleep: trouble staying asleep  Vivid Dreams:  No.  Acting out dreams:  Yes.  , "he fusses and he has fallen out of the bed fussing" - that was 3-6 months ago Wet Pillows: No. Postural symptoms:  Yes.    Falls?  No. Bradykinesia symptoms: shuffling gait, slow movements and difficulty getting out of a chair Loss of smell:  No. Loss of taste:  No. Urinary Incontinence:  No. Difficulty Swallowing:  No. Handwriting, micrographia: No. per patient, but yes per wife Trouble with ADL's:  No.  Trouble buttoning clothing: Yes.   Depression:  Yes.   (was why risperdal was added.  Tried to reduce dose as tremors worse with higher dosage Memory changes:  Yes.   (short term; wife prepares pill box but patient able to remember to take on own; drives without problem; on aricept x 3+ years per wife and started by mental health) Hallucinations:  No.  visual distortions: No. N/V:  No. Lightheaded:  Yes.    Syncope: No. Diplopia:  No. Dyskinesia:  No.  Neuroimaging of the brain has not previously been performed in the recent years.  Had an MRI in 2009.  It showed  mild small vessel disease.  PREVIOUS MEDICATIONS: Risperdal  ALLERGIES:   Allergies  Allergen Reactions  . Bee Venom Anaphylaxis  . Codeine Shortness Of Breath and Other (See Comments)    CURRENT MEDICATIONS:  Outpatient Encounter Medications as of 08/28/2017  Medication Sig  . aspirin 81 MG EC tablet Take 81 mg by mouth daily.    Marland Kitchen buPROPion (WELLBUTRIN SR) 150 MG 12 hr tablet Take 1 tablet (150 mg total) by mouth 2 (two) times daily.  . clopidogrel (PLAVIX) 75 MG tablet Take 75 mg by mouth daily.  Marland Kitchen donepezil (ARICEPT) 10 MG tablet Take 10 mg by mouth daily.  Marland Kitchen gabapentin (NEURONTIN) 100 MG capsule Take 100 mg by mouth 3 (three) times daily.   Marland Kitchen glimepiride (AMARYL) 4 MG tablet Take 4 mg by mouth daily with breakfast.  . lisinopril (PRINIVIL,ZESTRIL) 5 MG tablet Take 5 mg by mouth daily.  . metFORMIN (GLUCOPHAGE) 1000 MG tablet Take 1 tablet (1,000 mg total) by mouth 2 (two) times daily with a meal. HOLD 10/21.  May resume on 10/22.  Marland Kitchen metoprolol tartrate (LOPRESSOR) 25 MG tablet Take 25 mg by mouth 2 (two) times daily.  . Multiple Vitamin (MULTIVITAMIN WITH MINERALS) TABS tablet Take 1 tablet by mouth daily.  . Omega-3 Fatty Acids (FISH OIL PO) Take 1,400 mg by mouth daily.  . pantoprazole (PROTONIX)  40 MG tablet Take 40 mg by mouth every evening.   . pravastatin (PRAVACHOL) 40 MG tablet Take 40 mg by mouth daily.  . risperiDONE (RISPERDAL) 1 MG tablet Take 1.5 tablets (1.5 mg total) by mouth daily. Taking 1.5 Tablets QD (Patient taking differently: Take 0.5 mg by mouth daily. )  . traZODone (DESYREL) 50 MG tablet Take 1 tablet (50 mg total) by mouth at bedtime.  . nitroGLYCERIN (NITROSTAT) 0.4 MG SL tablet Place 1 tablet (0.4 mg total) under the tongue every 5 (five) minutes as needed. For chest pain (Patient not taking: Reported on 08/28/2017)  . [DISCONTINUED] Ascorbic Acid (VITAMIN C PO) Take 1 tablet by mouth daily.  . [DISCONTINUED] Biotin 1000 MCG tablet Take 1,000 mcg by mouth  daily.  . [DISCONTINUED] donepezil (ARICEPT) 5 MG tablet Take 1 tablet (5 mg total) by mouth at bedtime.  . [DISCONTINUED] lisinopril (PRINIVIL,ZESTRIL) 2.5 MG tablet Take 2.5 mg by mouth daily.  . [DISCONTINUED] pravastatin (PRAVACHOL) 20 MG tablet Take 20 mg by mouth daily.  . [DISCONTINUED] zinc gluconate 50 MG tablet Take 50 mg by mouth daily.   No facility-administered encounter medications on file as of 08/28/2017.     PAST MEDICAL HISTORY:   Past Medical History:  Diagnosis Date  . Anxiety   . Arthritis    "knees" (04/15/2016)  . CAD (coronary artery disease)    a. s/p CABG, DES to SVG to PLA, DES to RCA  b. low risk nuclear stress test 04/2014  . COPD (chronic obstructive pulmonary disease) ([place])   . Dementia   . Depression   . Dyslipidemia   . Essential hypertension, benign   . GERD (gastroesophageal reflux disease)   . History of Bell's palsy   . Myocardial infarction ([place]) 12/2010  . Type 2 diabetes mellitus ([place])     PAST SURGICAL HISTORY:   Past Surgical History:  Procedure Laterality Date  . APPENDECTOMY    . CARDIAC CATHETERIZATION  04/15/2016  . CARDIAC CATHETERIZATION N/A 04/15/2016   Procedure: Left Heart Cath and Cors/Grafts Angiography;  Surgeon: Belva Crome, MD;  Location: [place] [facility];  Service: Cardiovascular;  Laterality: N/A;  . CHOLECYSTECTOMY OPEN    . CORONARY ARTERY BYPASS GRAFT  01/01/2011   LIMA to LAD, SVG to PLA, SVG to PDA  . HAMMER TOE SURGERY Bilateral   . KNEE ARTHROSCOPY Left   . LEFT HEART CATHETERIZATION WITH CORONARY/GRAFT ANGIOGRAM N/A 03/01/2012   Procedure: LEFT HEART CATHETERIZATION WITH Beatrix Fetters;  Surgeon: Sherren Mocha, MD;  Location: [facility] CATH [facility];  Service: Cardiovascular;  Laterality: N/A;  . ROTATOR CUFF REPAIR Bilateral   . SHOULDER OPEN ROTATOR CUFF REPAIR Bilateral   . TONSILLECTOMY     tubes  . TYMPANOMASTOIDECTOMY Right 05/20/2013   Procedure: RIGHT CANAL UP TYMPANOMASTOIDECTOMY WITH TYPE I  TYMPANOPLASTY AND INSERTION OF A TUBE;  Surgeon: Fannie Knee, MD;  Location: [place];  Service: ENT;  Laterality: Right;    SOCIAL HISTORY:   Social History   Socioeconomic History  . Marital status: Married    Spouse name: Not on file  . Number of children: Not on file  . Years of education: Not on file  . Highest education level: Not on file  Social Needs  . Financial resource strain: Not on file  . Food insecurity - worry: Not on file  . Food insecurity - inability: Not on file  . Transportation needs - medical: Not on file  . Transportation needs - non-medical:  Not on file  Occupational History  . Not on file  Tobacco Use  . Smoking status: Former Smoker    Packs/day: 1.00    Years: 50.00    Pack years: 50.00    Types: Cigarettes    Last attempt to quit: 12/26/2010    Years since quitting: 6.6  . Smokeless tobacco: Never Used  Substance and Sexual Activity  . Alcohol use: Yes    Alcohol/week: 0.0 oz    Comment: 04/15/2016 "nothing since 1988"  . Drug use: No  . Sexual activity: Not Currently  Other Topics Concern  . 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 Brother  Alive  . Mother  Deceased  . Father  Deceased  . Sister  Alive  . Brother Social research officer, government  . Son  Alive  . Daughter  Alive    ROS:  Intermittent CP (none today) - sees cardiology.  Some heavy in the R arm.  A complete 10 system review of systems was obtained and was unremarkable apart from what is mentioned above.  PHYSICAL EXAMINATION:    VITALS:   Vitals:   08/28/17 1226  BP: 108/60  Pulse: 64  SpO2: 96%  Weight: 188 lb (85.3 kg)  Height: 5\' 8"  (1.727 m)    GEN:  The patient appears stated age and is in NAD. HEENT:  Normocephalic, atraumatic.  The mucous membranes are moist. The superficial temporal arteries are without ropiness or tenderness. CV:  RRR Lungs:   CTAB Neck/HEME:  There are no carotid bruits bilaterally. MS:  R hand mildly swollen (edema)  Neurological examination:  Orientation: The patient is alert and oriented x3.  He has difficulty with remote memory.  He has normal attention and concentration.  He is unable to name the months backward.  He has no issues with naming objects.  Cranial nerves: There is good facial symmetry. Pupils are equal round and reactive to light bilaterally. Fundoscopic exam reveals clear margins bilaterally. Extraocular muscles are intact. The visual fields are full to confrontational testing. The speech is fluent and clear but it is hypophonic. Soft palate rises symmetrically and there is no tongue deviation. Hearing is intact to conversational tone. Sensation: Sensation is intact to light and pinprick throughout (facial, trunk, extremities). Vibration is decreased at the bilateral big toe. There is no extinction with double simultaneous stimulation. There is no sensory dermatomal level identified. Motor: Strength is 5/5 in the bilateral upper and lower extremities.   Shoulder shrug is equal and symmetric.  There is no pronator drift. Deep tendon reflexes: Deep tendon reflexes are 2-/4 at the bilateral biceps, triceps, brachioradialis, and absent at the bilateral patella and achilles. Plantar responses are downgoing bilaterally.  Movement examination: Tone: There is increased tone in the RUE.  There is normal tone elsewhere.   Abnormal movements: There is R>LUE resting tremor Coordination:  There is  decremation with RAM's, with  alternation of supination/pronation of the forearm bilaterally.  There is mild decremation with toe taps and heel taps bilaterally. Gait and Station: The patient has mild difficulty arising out of a deep-seated chair without the use of the hands.  On the first try, he gets up and then falls back into the chair.  He is able to do this on the second attempt.  The patient's stride length is  normal.  The patient has a positive pull test.      ASSESSMENT/PLAN:  1. Parkinsonism  -I had a long counseling session with the patient today.  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  Tremor came on just a few months after he started that last year.  I explained that one clinically cannot tell the difference between idiopathic parkinsons disease.  I also explained that even if one is able to get off of the medication, it can take up to 6 months to clinically definitively know if this is idiopathic parkinsons disease.  I did not advise that the patient go off of medication, as this needs to be discussed with the patients prescribing physician.  I did, however, tell the patient that the longer one is on the medication, the worse the symptoms can get.  The patient is to make an appointment with Monico Blitz, MD to discuss what I have discussed with him.  He has already cut back the medication significantly, from 2 mg to 0.5 mg daily.  2.  Memory change  -talked about neurocognitive testing.  He has a low level of education (8th grade) but told him that the test will adjust for that.  He does have a hx of dyslexia.  He would like to proceed with neurocognitive testing.  He has apparently been on Aricept for several years.  3.  I will plan on seeing him back in 6 months, sooner should new neurologic issues arise.  Greater than 50% of the 45-minute visit was spent in counseling with the patient and his wife.  Cc:  Monico Blitz, MD

## 2017-08-28 ENCOUNTER — Ambulatory Visit: Payer: Medicare Other | Admitting: Neurology

## 2017-08-28 ENCOUNTER — Encounter: Payer: Self-pay | Admitting: Neurology

## 2017-08-28 VITALS — BP 108/60 | HR 64 | Ht 68.0 in | Wt 188.0 lb

## 2017-08-28 DIAGNOSIS — R413 Other amnesia: Secondary | ICD-10-CM

## 2017-08-28 DIAGNOSIS — G2111 Neuroleptic induced parkinsonism: Secondary | ICD-10-CM

## 2017-08-28 NOTE — Patient Instructions (Signed)
Talk to Dr. Manuella Ghazi about your Risperdol   You have been referred for a neurocognitive evaluation in our office.   The evaluation consists of three appointments.   1. The first appointment is about 45 minutes and is a clinical interview with the neuropsychologist (Dr. Macarthur Critchley). You can bring someone with you to this appointment, as it is helpful for Dr. Si Raider to hear from both you and another adult who knows you well.   2. The second appointment is 2-3 hours long and is with the psychometrician Milana Kidney). You will complete a variety of tasks- mostly question-and-answer, some paper-and-pencil, some on the computer. There is nothing you need to do to prepare for this appointment, but having a good night's sleep prior to the testing, and bringing eyeglasses and hearing aids (if you wear them), is advised.   3. The final appointment is a follow-up with Dr. Si Raider where she will go over the test results with you and provide recommendations. This appointment is about 30 minutes.  If you would like a family member to receive this information as well, please bring them to the appointment.   We have to reserve several hours of the neuropsychologist's time and the psychometrician's time for your appointment. As such, please note that there is a No-Show fee of $100. If you are unable to attend any of your appointments, please contact our office as soon as possible to reschedule.

## 2017-09-19 ENCOUNTER — Encounter: Payer: Self-pay | Admitting: Psychology

## 2018-01-25 ENCOUNTER — Encounter: Payer: Self-pay | Admitting: Psychology

## 2018-02-15 ENCOUNTER — Encounter: Payer: Self-pay | Admitting: Psychology

## 2018-03-01 ENCOUNTER — Ambulatory Visit: Payer: Self-pay | Admitting: Neurology

## 2018-03-26 ENCOUNTER — Ambulatory Visit (INDEPENDENT_AMBULATORY_CARE_PROVIDER_SITE_OTHER): Payer: Medicare Other | Admitting: Psychology

## 2018-03-26 ENCOUNTER — Ambulatory Visit: Payer: Medicare Other | Admitting: Psychology

## 2018-03-26 ENCOUNTER — Encounter: Payer: Self-pay | Admitting: Psychology

## 2018-03-26 DIAGNOSIS — G20 Parkinson's disease: Secondary | ICD-10-CM

## 2018-03-26 DIAGNOSIS — G2111 Neuroleptic induced parkinsonism: Secondary | ICD-10-CM

## 2018-03-26 DIAGNOSIS — R413 Other amnesia: Secondary | ICD-10-CM | POA: Diagnosis not present
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
## 2018-03-26 NOTE — Progress Notes (Signed)
   Neuropsychology Note  [name] completed 60 minutes of neuropsychological testing with technician, Milana Kidney, BS, under the supervision of Dr. Macarthur Critchley, Licensed Psychologist. The patient did not appear overtly distressed by the testing session, per behavioral observation or via self-report to the technician. Rest breaks were offered.   Clinical Decision Making: In considering the patient's current level of functioning, level of presumed impairment, nature of symptoms, emotional and behavioral responses during the interview, level of literacy, and observed level of motivation/effort, a battery of tests was selected and communicated to the psychometrician.  Communication between the psychologist and technician was ongoing throughout the testing session and changes were made as deemed necessary based on patient performance on testing, technician observations and additional pertinent factors such as those listed above.  [name] will return within approximately 2 weeks for an interactive feedback session with Dr. Si Raider at which time his test performances, clinical impressions and treatment recommendations will be reviewed in detail. The patient understands he can contact our office should he require our assistance before this time.  35 minutes spent performing neuropsychological evaluation services/clinical decision making (psychologist). [CPT 35789] 60 minutes spent face-to-face with patient administering standardized tests, 30 minutes spent scoring (technician). [CPT [id number], 78478]  Full report to follow.
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
## 2018-04-03 ENCOUNTER — Encounter: Payer: Self-pay | Admitting: Psychology

## 2018-04-06 ENCOUNTER — Encounter: Payer: Self-pay | Admitting: Psychology

## 2018-04-06 ENCOUNTER — Ambulatory Visit (INDEPENDENT_AMBULATORY_CARE_PROVIDER_SITE_OTHER): Payer: Medicare Other | Admitting: Psychology

## 2018-04-06 DIAGNOSIS — F322 Major depressive disorder, single episode, severe without psychotic features: Secondary | ICD-10-CM

## 2018-04-06 DIAGNOSIS — G3184 Mild cognitive impairment, so stated: Secondary | ICD-10-CM

## 2018-04-06 DIAGNOSIS — G20 Parkinson's disease: Secondary | ICD-10-CM
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
## 2018-04-11 NOTE — Progress Notes (Signed)
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  Patient is off of Risperdal.  Reports that he went off of the Risperdal not long after I had seen the patient (states that he went off of it the day I saw him).  Wife states that his mood got much worse.  States that tremor got worse, esp with picking up coffee.  Patient did have neurocognitive testing with Dr. Si Raider on March 26, 2018 and has reviewed results with her.  This just demonstrated severe depression with mild cognitive impairment.  He brings an MRI brain CD today but it wouldn't open on 2 of our computers.  I did get a copy of the report.  It was done for dizziness.  This was reported to show chronic small vessel disease was that had worsened since 2009 and postop right mastoidectomy.    PREVIOUS MEDICATIONS: Risperdal  ALLERGIES:   Allergies  Allergen Reactions  . Bee Venom Anaphylaxis  . Codeine Shortness Of Breath and Other (See Comments)    CURRENT MEDICATIONS:  Outpatient Encounter Medications as of 04/13/2018  Medication Sig  . aspirin 81 MG EC tablet Take 81 mg by mouth daily.    Marland Kitchen buPROPion (WELLBUTRIN SR) 150 MG 12 hr tablet Take 1 tablet (150 mg total) by mouth 2 (two) times daily.  . clopidogrel (PLAVIX) 75 MG tablet Take 75 mg by mouth daily.  Marland Kitchen donepezil (ARICEPT) 10 MG tablet Take 10 mg by mouth daily.  Marland Kitchen gabapentin (NEURONTIN) 100 MG capsule Take 100 mg by mouth 3 (three) times daily.   Marland Kitchen glimepiride (AMARYL) 4 MG tablet Take 4 mg by mouth daily with breakfast.  . lisinopril (PRINIVIL,ZESTRIL) 5 MG tablet Take 5 mg by mouth daily.  . metFORMIN (GLUCOPHAGE) 1000 MG tablet Take 1 tablet (1,000 mg total) by mouth 2 (two) times daily with a meal. HOLD 10/21.  May resume on 10/22.  Marland Kitchen metoprolol tartrate (LOPRESSOR) 25 MG tablet Take 25 mg by mouth 2 (two) times daily.  . Multiple Vitamin (MULTIVITAMIN WITH MINERALS) TABS tablet Take 1 tablet by mouth daily.  . pantoprazole (PROTONIX) 40 MG  tablet Take 40 mg by mouth every evening.   . pravastatin (PRAVACHOL) 40 MG tablet Take 40 mg by mouth daily.  . traZODone (DESYREL) 50 MG tablet Take 1 tablet (50 mg total) by mouth at bedtime.  . nitroGLYCERIN (NITROSTAT) 0.4 MG SL tablet Place 1 tablet (0.4 mg total) under the tongue every 5 (five) minutes as needed. For chest pain (Patient not taking: Reported on 08/28/2017)  . [DISCONTINUED] Omega-3 Fatty Acids (FISH OIL PO) Take 1,400 mg by mouth daily.  . [DISCONTINUED] risperiDONE (RISPERDAL) 1 MG tablet Take 1.5 tablets (1.5 mg total) by mouth daily. Taking 1.5 Tablets QD (Patient taking differently: Take 0.5 mg by mouth daily. )   No facility-administered encounter medications on file as of 04/13/2018.     PAST MEDICAL HISTORY:   Past Medical History:  Diagnosis Date  . Anxiety   . Arthritis    "knees" (04/15/2016)  . CAD (coronary artery disease)    a. s/p CABG, DES to SVG to PLA, DES to RCA  b. low risk nuclear stress test 04/2014  . COPD (chronic obstructive pulmonary disease) ([place])   . Dementia ([place])   . Depression   . Dyslipidemia   . Essential hypertension, benign   . GERD (gastroesophageal reflux disease)   . History of Bell's palsy   . Myocardial infarction ([place]) 12/2010  .  Type 2 diabetes mellitus ([place])     PAST SURGICAL HISTORY:   Past Surgical History:  Procedure Laterality Date  . APPENDECTOMY    . CARDIAC CATHETERIZATION  04/15/2016  . CARDIAC CATHETERIZATION N/A 04/15/2016   Procedure: Left Heart Cath and Cors/Grafts Angiography;  Surgeon: Belva Crome, MD;  Location: [facility];  Service: Cardiovascular;  Laterality: N/A;  . CHOLECYSTECTOMY OPEN    . CORONARY ARTERY BYPASS GRAFT  01/01/2011   LIMA to LAD, SVG to PLA, SVG to PDA  . HAMMER TOE SURGERY Bilateral   . KNEE ARTHROSCOPY Left   . LEFT HEART CATHETERIZATION WITH CORONARY/GRAFT ANGIOGRAM N/A 03/01/2012   Procedure: LEFT HEART CATHETERIZATION WITH Beatrix Fetters;  Surgeon:  Sherren Mocha, MD;  Location: [facility] CATH [facility];  Service: Cardiovascular;  Laterality: N/A;  . ROTATOR CUFF REPAIR Bilateral   . SHOULDER OPEN ROTATOR CUFF REPAIR Bilateral   . TONSILLECTOMY     tubes  . TYMPANOMASTOIDECTOMY Right 05/20/2013   Procedure: RIGHT CANAL UP TYMPANOMASTOIDECTOMY WITH TYPE I TYMPANOPLASTY AND INSERTION OF A TUBE;  Surgeon: Fannie Knee, MD;  Location: [facility];  Service: ENT;  Laterality: Right;    SOCIAL HISTORY:   Social History   Socioeconomic History  . Marital status: Married    Spouse name: Not on file  . Number of children: Not on file  . Years of education: Not on file  . Highest education level: Not on file  Occupational History  . Occupation: disabled    Comment: prior drove forklift  Social Needs  . Financial resource strain: Not on file  . Food insecurity:    Worry: Not on file    Inability: Not on file  . Transportation needs:    Medical: Not on file    Non-medical: Not on file  Tobacco Use  . Smoking status: Former Smoker    Packs/day: 1.00    Years: 50.00    Pack years: 50.00    Types: Cigarettes    Last attempt to quit: 12/26/2010    Years since quitting: 7.3  . Smokeless tobacco: Never Used  Substance and Sexual Activity  . 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 Drug use: No  . Sexual activity: Not Currently  Lifestyle  . Physical activity:    Days per week: Not on file    Minutes per session: Not on file  . Stress: Not on file  Relationships  . Social connections:    Talks on phone: Not on file    Gets together: Not on file    Attends religious service: Not on file    Active member of club or organization: Not on file    Attends meetings of clubs or organizations: Not on file    Relationship status: Not on file  . Intimate partner violence:    Fear of current or ex partner: Not on file    Emotionally abused: Not on file    Physically abused: Not on file    Forced sexual  activity: Not on file  Other Topics Concern  . 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 Brother  Alive  . Mother  Deceased  . Father  Deceased  . Sister  Alive  . Brother Social research officer, government  .  Son  Alive  . Daughter  Alive    ROS:  Review of Systems  Constitutional: Positive for malaise/fatigue.  HENT: Negative.   Eyes: Negative.   Cardiovascular: Negative.   Gastrointestinal: Negative.   Skin: Negative.   Neurological: Positive for tremors.  Endo/Heme/Allergies: Negative.      PHYSICAL EXAMINATION:    VITALS:   Vitals:   04/13/18 1342  BP: 104/72  Pulse: 64  SpO2: 97%  Weight: 180 lb (81.6 kg)  Height: 5\' 8"  (1.727 m)    GEN:  The patient appears stated age and is in NAD. HEENT:  Normocephalic, atraumatic.  The mucous membranes are moist. The superficial temporal arteries are without ropiness or tenderness. CV:  RRR Lungs:  CTAB Neck/HEME:  There are no carotid bruits bilaterally.  Neurological examination:  Orientation: The patient is alert and oriented x3. Cranial nerves: There is good facial symmetry. The speech is fluent and clear. Soft palate rises symmetrically and there is no tongue deviation. Hearing is intact to conversational tone. Sensation: Sensation is intact to light touch throughout Motor: Strength is 5/5 in the bilateral upper and lower extremities.   Shoulder shrug is equal and symmetric.  There is no pronator drift.  Movement examination: Tone: There is normal tone in the UE/LE Abnormal movements: There is no rest tremor even with distraction.  There is mild intention tremor. Min trouble pouring water from one glass to another. Coordination:  There is no decremation today with rapid alternating movements Gait and Station: The patient rises out of the chair and walks well down the hall.    ASSESSMENT/PLAN:  1.  Parkinsonism  -Resolved off Risperdal.   -Patient and his wife describe him very differently than what I see today.  I saw no tremor although they complain about it.  He walked well down the hall, but his wife thinks that he is generally much worse than what I saw today.  We decided to go ahead and initiate a DaTscan, but I do not see any parkinsonism today.  -MRI results reviewed with patient and wife, but I do not have a copy of films that I can open.  I will try to get that.  2.  Memory change  -Had neurocognitive testing in September, 2019 demonstrating severe depression. He has no SI/HI.  Psychiatry follow-up/consultation was highly recommended, but declined by the patient.  He does state that he will follow back up with Dr. Manuella Ghazi to get recommendations.  I would recommend trying to avoid the atypical antipsychotic class of medications if possible.  3.  Follow-up will depend on results of the DaTscan.  Cc:  Monico Blitz, MD

## 2018-04-12 ENCOUNTER — Encounter: Payer: Self-pay | Admitting: Psychology

## 2018-04-13 ENCOUNTER — Ambulatory Visit: Payer: Medicare Other | Admitting: Neurology

## 2018-04-13 ENCOUNTER — Encounter: Payer: Self-pay | Admitting: Neurology

## 2018-04-13 VITALS — BP 104/72 | HR 64 | Ht 68.0 in | Wt 180.0 lb

## 2018-04-13 DIAGNOSIS — R251 Tremor, unspecified: Secondary | ICD-10-CM | POA: Diagnosis not present

## 2018-04-13 DIAGNOSIS — F332 Major depressive disorder, recurrent severe without psychotic features: Secondary | ICD-10-CM | POA: Diagnosis not present

## 2018-04-13 NOTE — Patient Instructions (Signed)
1. Stop Buproprion 8 days prior to DAT scan.  We have sent a referral to [facility] for your DAT scan and they will call you directly to schedule your appt.. They are located at [address] If you need to contact them directly please call [phone].

## 2018-04-16 ENCOUNTER — Encounter: Payer: Self-pay | Admitting: Psychology

## 2018-04-19 ENCOUNTER — Telehealth: Payer: Self-pay | Admitting: Neurology

## 2018-04-19 NOTE — Telephone Encounter (Signed)
Approval received for DAT SCAN and faxed to [facility] at [phone] with confirmation received. She will call patient to schedule.

## 2018-04-19 NOTE — Addendum Note (Signed)
Addended byAnnamaria Helling on: 04/19/2018 11:44 AM   Modules accepted: Orders

## 2018-04-20 ENCOUNTER — Ambulatory Visit: Payer: Self-pay | Admitting: Neurology

## 2018-04-26 ENCOUNTER — Telehealth: Payer: Self-pay | Admitting: Neurology

## 2018-04-26 NOTE — Telephone Encounter (Signed)
Sent a message to contact at [facility] and am awaiting a response.

## 2018-04-26 NOTE — Telephone Encounter (Signed)
Patient's wife called and left a vm needing the # for the place that is doing the scan of the brain that Dr.Tat ordered. She said she got a letter from insur stating they approved it but yet has not received a call to schedule. Please call her back at [phone]. Thanks!

## 2018-04-27 NOTE — Telephone Encounter (Signed)
Spoke with patient's wife and made her aware I have reached out and not heard back yet, but gave her the number [phone]) to reach [facility] in scheduling.

## 2018-04-30 ENCOUNTER — Telehealth: Payer: Self-pay | Admitting: Neurology

## 2018-04-30 NOTE — Telephone Encounter (Signed)
Patient's wife is calling needing to see why no one has called and set appt for the testing. Please call her back at [phone]. Thanks! She needs it for Wednesday while they are in [place].

## 2018-04-30 NOTE — Telephone Encounter (Signed)
Spoke with Lovena Le in scheduling and she contacted patient. They are scheduled for 05/10/18.

## 2018-05-10 ENCOUNTER — Ambulatory Visit (HOSPITAL_COMMUNITY)
Admission: RE | Admit: 2018-05-10 | Discharge: 2018-05-10 | Disposition: A | Discharge status: Home / Self Care | Payer: Medicare Other | Source: Ambulatory Visit | Attending: Neurology | Admitting: Neurology

## 2018-05-10 ENCOUNTER — Telehealth: Payer: Self-pay | Admitting: Neurology

## 2018-05-10 ENCOUNTER — Encounter (HOSPITAL_COMMUNITY)
Admission: RE | Admit: 2018-05-10 | Discharge: 2018-05-10 | Disposition: A | Discharge status: Home / Self Care | Payer: Medicare Other | Source: Ambulatory Visit | Attending: Neurology | Admitting: Neurology

## 2018-05-10 DIAGNOSIS — R251 Tremor, unspecified: Secondary | ICD-10-CM | POA: Insufficient documentation

## 2018-05-10 MED ORDER — IODINE STRONG (LUGOLS) 5 % PO SOLN
0.8000 mL | Freq: Once | ORAL | Status: AC
  Administered 2018-05-10: 0.8 mL via ORAL
  Filled 2018-05-10: qty 0.8

## 2018-05-10 MED ORDER — IOFLUPANE I 123 185 MBQ/2.5ML IV SOLN
4.7000 | Freq: Once | INTRAVENOUS | Status: AC
  Administered 2018-05-10: 4.7 via INTRAVENOUS

## 2018-05-10 NOTE — Telephone Encounter (Signed)
Let pt know that DaT scan is normal.  No evidence of parkinsonian syndrome, which correlates well with my examination of him

## 2018-05-11 NOTE — Telephone Encounter (Signed)
No.  Clinically, he had no parkinsonism and DaT correlated with that.

## 2018-05-11 NOTE — Telephone Encounter (Signed)
Patient's wife made aware.  Aware no need to follow up here. She asked about tremor and I advised it was not seen by Dr. Carles Collet, she asked about memory and I advised it was recommended he follow up with psychiatry as neurocognitive testing showed severe depression.  She expressed understanding.

## 2018-05-11 NOTE — Telephone Encounter (Signed)
Last note states follow up with depend on results of this. Before I call, will patient need a follow up?

## 2019-03-22 ENCOUNTER — Telehealth: Payer: Self-pay | Admitting: *Deleted

## 2019-03-22 MED ORDER — NITROGLYCERIN 0.4 MG SL SUBL: 0.4000 mg | SUBLINGUAL_TABLET | SUBLINGUAL | 0 refills | Status: DC | PRN

## 2019-03-22 NOTE — Telephone Encounter (Signed)
They should call EMS and have him evaluated urgently in the ER.  He is taking a very big risk by waiting.

## 2019-03-22 NOTE — Telephone Encounter (Signed)
Wife informed and verbalized understanding. Says she has told patient this already and he still refuses.

## 2019-03-22 NOTE — Telephone Encounter (Signed)
Reports active chest pain that started 2 days ago rated 8/10. Also reports sob and dizziness. Out of nitroglycerin. Took 2 baby aspirin. Is out of nitroglycerin. Advised to go to the ED now. Per wife, patient refuses to go to the ED. Wife stated "If he goes down, I will take him to the ED." Advised that he should go now and not wait until that occurs. Wife verbalized understanding. Refill sent to [facility] for nitro.   Wife also aware that patient has not been seen since 2016. New patient appointment scheduled 04/17/2019 9:00 am with Domenic Polite.

## 2019-04-16 ENCOUNTER — Encounter: Payer: Self-pay | Admitting: *Deleted

## 2019-04-17 ENCOUNTER — Ambulatory Visit: Payer: Medicare Other | Admitting: Cardiology

## 2019-04-17 ENCOUNTER — Encounter: Payer: Self-pay | Admitting: Cardiology

## 2019-04-17 ENCOUNTER — Other Ambulatory Visit: Payer: Self-pay

## 2019-04-17 ENCOUNTER — Telehealth: Payer: Self-pay | Admitting: Cardiology

## 2019-04-17 VITALS — BP 158/82 | HR 69 | Ht 68.0 in | Wt 181.2 lb

## 2019-04-17 DIAGNOSIS — E782 Mixed hyperlipidemia: Secondary | ICD-10-CM | POA: Diagnosis not present

## 2019-04-17 DIAGNOSIS — I10 Essential (primary) hypertension: Secondary | ICD-10-CM

## 2019-04-17 DIAGNOSIS — I255 Ischemic cardiomyopathy: Secondary | ICD-10-CM | POA: Diagnosis not present

## 2019-04-17 DIAGNOSIS — I25119 Atherosclerotic heart disease of native coronary artery with unspecified angina pectoris: Secondary | ICD-10-CM

## 2019-04-17 MED ORDER — ISOSORBIDE MONONITRATE ER 30 MG PO TB24: 30.0000 mg | ORAL_TABLET | Freq: Every evening | ORAL | 3 refills | Status: DC

## 2019-04-17 MED ORDER — ROSUVASTATIN CALCIUM 5 MG PO TABS: 5.0000 mg | ORAL_TABLET | Freq: Every day | ORAL | 3 refills | Status: DC

## 2019-04-17 NOTE — Patient Instructions (Addendum)
Medication Instructions:    Your physician has recommended you make the following change in your medication:   Stop pravastatin  Start rosuvastatin (crestor) 5 mg by mouth daily at bedtime  Start isosorbide mononitrate (imdur) 30 mg by mouth daily in the evening  Continue all other medications the same  Labwork:  NONE  Testing/Procedures: Your physician has requested that you have an echocardiogram. Echocardiography is a painless test that uses sound waves to create images of your heart. It provides your doctor with information about the size and shape of your heart and how well your heart's chambers and valves are working. This procedure takes approximately one hour. There are no restrictions for this procedure.  Follow-Up:  Your physician recommends that you schedule a follow-up appointment in: 4 weeks with Bernerd Pho PA at the [place] office.   Any Other Special Instructions Will Be Listed Below (If Applicable).  If you need a refill on your cardiac medications before your next appointment, please call your pharmacy.

## 2019-04-17 NOTE — Telephone Encounter (Signed)
Pre-cert Verification for the following procedure    Echo scheduled for 04-26-2019 at [facility]

## 2019-04-17 NOTE — Progress Notes (Signed)
Cardiology Office Note  Date: 04/17/2019   ID: [name], DOB 04/14/1951, MRN [id number]  PCP:  Monico Blitz, MD  Cardiologist:  Rozann Lesches, MD Electrophysiologist:  None   Chief Complaint  Patient presents with  . Chest Pain    History of Present Illness: [name] is a 68 y.o. male referred for cardiology consultation by Dr. Manuella Ghazi, presumably to establish follow-up of CAD based on provided records.  He was seen by our practice in the past, not since 2016.  He is here today with his wife.  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  Over the last month or so he has been experiencing exertional angina.  Nitroglycerin was called in although he has not had to use it.  Symptoms resolved with rest.  He also reports dyspnea on exertion.  Cardiac history as outlined below.  Last cardiac catheterization was in 2017, also reviewed below.  Medical therapy was recommended at that time with documented graft disease.  He has not had a recent follow-up echocardiogram.  I reviewed his lab work from July.  He reports compliance with his medications which are outlined below.  He has been on Pravachol and his last LDL was 107.  I personally reviewed his ECG today which shows sinus rhythm with right bundle branch block and left anterior fascicular block, old.  Past Medical History:  Diagnosis Date  . Anxiety   . Arthritis   . CAD (coronary artery disease)    a. s/p CABG, DES to SVG to PLA, DES to RCA  b. low risk nuclear stress test 04/2014  . COPD (chronic obstructive pulmonary disease) ([place])   . Dementia ([place])   . Depression   . Dyslipidemia   . Essential hypertension   . GERD (gastroesophageal reflux disease)   . History of Bell's palsy   . Myocardial infarction ([place]) 12/2010  . Type 2 diabetes mellitus ([place])     Past Surgical History:  Procedure Laterality Date  . APPENDECTOMY    . CARDIAC CATHETERIZATION   04/15/2016  . CARDIAC CATHETERIZATION N/A 04/15/2016   Procedure: Left Heart Cath and Cors/Grafts Angiography;  Surgeon: Belva Crome, MD;  Location: [place] [facility];  Service: Cardiovascular;  Laterality: N/A;  . CHOLECYSTECTOMY OPEN    . CORONARY ARTERY BYPASS GRAFT  01/01/2011   LIMA to LAD, SVG to PLA, SVG to PDA  . HAMMER TOE SURGERY Bilateral   . KNEE ARTHROSCOPY Left   . LEFT HEART CATHETERIZATION WITH CORONARY/GRAFT ANGIOGRAM N/A 03/01/2012   Procedure: LEFT HEART CATHETERIZATION WITH Beatrix Fetters;  Surgeon: Sherren Mocha, MD;  Location: [facility] CATH LAB;  Service: Cardiovascular;  Laterality: N/A;  . ROTATOR CUFF REPAIR Bilateral   . SHOULDER OPEN ROTATOR CUFF REPAIR Bilateral   . TONSILLECTOMY     tubes  . TYMPANOMASTOIDECTOMY Right 05/20/2013   Procedure: RIGHT CANAL UP TYMPANOMASTOIDECTOMY WITH TYPE I TYMPANOPLASTY AND INSERTION OF A TUBE;  Surgeon: Fannie Knee, MD;  Location: [place];  Service: ENT;  Laterality: Right;    Current Outpatient Medications  Medication Sig Dispense Refill  . aspirin 81 MG EC tablet Take 81 mg by mouth daily.      Marland Kitchen buPROPion (WELLBUTRIN SR) 150 MG 12 hr tablet Take 1 tablet (150 mg total) by mouth 2 (two) times daily. 180 tablet 2  . Cholecalciferol (VITAMIN D3 PO) Take 1,000 mg by mouth daily.    . clopidogrel (  PLAVIX) 75 MG tablet Take 75 mg by mouth daily.    Marland Kitchen donepezil (ARICEPT) 10 MG tablet Take 10 mg by mouth daily.    Marland Kitchen gabapentin (NEURONTIN) 100 MG capsule Take 100 mg by mouth 2 (two) times daily.     Marland Kitchen glimepiride (AMARYL) 4 MG tablet Take 4 mg by mouth daily with breakfast.    . lisinopril (PRINIVIL,ZESTRIL) 5 MG tablet Take 5 mg by mouth daily.    . metFORMIN (GLUCOPHAGE) 1000 MG tablet Take 1 tablet (1,000 mg total) by mouth 2 (two) times daily with a meal. HOLD 10/21.  May resume on 10/22.    Marland Kitchen metoprolol tartrate (LOPRESSOR) 25 MG tablet Take 25 mg by mouth daily.     . Multiple Vitamin (MULTIVITAMIN WITH MINERALS) TABS  tablet Take 1 tablet by mouth daily.    . nitroGLYCERIN (NITROSTAT) 0.4 MG SL tablet Place 1 tablet (0.4 mg total) under the tongue every 5 (five) minutes x 3 doses as needed for chest pain (if no relief after 3 rd dose, proceed to the ED for an evaluation or call 911). For chest pain 25 tablet 0  . pantoprazole (PROTONIX) 40 MG tablet Take 40 mg by mouth every evening.     . traZODone (DESYREL) 50 MG tablet Take 1 tablet (50 mg total) by mouth at bedtime. 90 tablet 2  . vitamin C (ASCORBIC ACID) 500 MG tablet Take 500 mg by mouth daily.    . isosorbide mononitrate (IMDUR) 30 MG 24 hr tablet Take 1 tablet (30 mg total) by mouth every evening. 90 tablet 3  . rosuvastatin (CRESTOR) 5 MG tablet Take 1 tablet (5 mg total) by mouth daily. 90 tablet 3   No current facility-administered medications for this visit.    Allergies:  Bee venom and Codeine   Social History: The patient  reports that he quit smoking about 8 years ago. His smoking use included cigarettes. He has a 50.00 pack-year smoking history. He has never used smokeless tobacco. He reports current alcohol use. He reports that he does not use drugs.   Family History: The patient's family history includes Alcohol abuse in his brother; Diabetes in his brother, father, and sister; Glaucoma in his mother; Heart disease in his father and mother.   ROS:  Please see the history of present illness. Otherwise, complete review of systems is positive for hearing and memory loss.  All other systems are reviewed and negative.   Physical Exam: VS:  BP (!) 158/82   Pulse 69   Ht 5\' 8"  (1.727 m)   Wt 181 lb 3.2 oz (82.2 kg)   SpO2 100%   BMI 27.55 kg/m , BMI Body mass index is 27.55 kg/m.  Wt Readings from Last 3 Encounters:  04/17/19 181 lb 3.2 oz (82.2 kg)  04/13/18 180 lb (81.6 kg)  08/28/17 188 lb (85.3 kg)    General: Chronically ill-appearing male, no distress. HEENT: Conjunctiva and lids normal, wearing a mask. Neck: Supple, no  elevated JVP or carotid bruits, no thyromegaly. Lungs: Clear to auscultation, nonlabored breathing at rest. Cardiac: Regular rate and rhythm, no S3, soft systolic murmur, no pericardial rub. Abdomen: Soft, nontender, bowel sounds present, no guarding or rebound. Extremities: No pitting edema, distal pulses 2+. Skin: Warm and dry. Musculoskeletal: No kyphosis. Neuropsychiatric: Alert and oriented x3, affect grossly appropriate.  ECG:  An ECG dated 04/13/2016 was personally reviewed today and demonstrated:  Sinus bradycardia with right bundle branch block and left anterior fascicular block.  Recent Labwork:  July 2020: Hemoglobin A1c 7%, BUN 13, creatinine 1.18, potassium 4.7, AST 31, ALT 33, cholesterol 183, triglycerides 178, HDL 40, LDL 107, hemoglobin 11.2, platelets 318, TSH 1.99  Other Studies Reviewed Today:  Cardiac catheterization 04/15/2016:  Post Atrio-2 lesion, 70 %stenosed.  Post Atrio-1 lesion, 60 %stenosed.  SVG graft was visualized by angiography and is normal in caliber.  SVG.  Origin to Prox Graft lesion, 100 %stenosed.  LIMA and is small.  3rd RPLB lesion, 70 %stenosed.  Origin to Dist Graft lesion, 99 %stenosed.  Ost 1st Mrg to 1st Mrg lesion, 40 %stenosed.  Mid LAD to Dist LAD lesion, 60 %stenosed.  Ost 1st Diag lesion, 50 %stenosed.  The left ventricular ejection fraction is 35-45% by visual estimate.    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  SVG to RCA left ventricular branch is widely patent.  Left main is widely patent. There is segmental 50% proximal to mid LAD. Second diagonal contains 50% ostial narrowing. The first obtuse marginal contains 40% narrowing.  Mild to moderate decrease in LV function with an estimated ejection fraction of 35-40%.  When compared to the prior study, coronary and bypass graft anatomy is unchanged.  RECOMMENDATIONS:   Continue medical therapy.   Assessment and Plan:  1.  CAD with exertional angina as outlined above.  He has a history of multivessel disease status post CABG with documented graft disease as of 2017 and recommendation for medical therapy at that time.  The SVG to RCA LV branch was widely patent with occlusion of the SVG to PDA and atresia of the LIMA to LAD.  Plan is to add Imdur 30 mg daily and continue remaining medications to include aspirin, Plavix, lisinopril, and Lopressor.  We will switch him to a more potent statin.  Office follow-up arranged to review symptom control.  2.  Ischemic cardiomyopathy, LVEF was 35 to 40% at angiography back in 2017.  He has not had an interval echocardiogram and this will be arranged.  No reported palpitations or syncope.  3.  Dementia, functional with ADLs.  His wife helps him with medications.  4.  Mixed hyperlipidemia, has been on Pravachol with LDL 107 back in July.  We will switch to Crestor beginning at 5 mg daily.  5.  Essential hypertension, systolic is in the 656C.  Imdur is being added as noted above.  May need further medication titration.  Medication Adjustments/Labs and Tests Ordered: Current medicines are reviewed at length with the patient today.  Concerns regarding medicines are outlined above.   Tests Ordered: Orders Placed This Encounter  Procedures  . EKG 12-Lead  . ECHOCARDIOGRAM COMPLETE    Medication Changes: Meds ordered this encounter  Medications  . isosorbide mononitrate (IMDUR) 30 MG 24 hr tablet    Sig: Take 1 tablet (30 mg total) by mouth every evening.    Dispense:  90 tablet    Refill:  3    04/17/2019 NEW  . rosuvastatin (CRESTOR) 5 MG tablet    Sig: Take 1 tablet (5 mg total) by mouth daily.    Dispense:  90 tablet    Refill:  3    04/17/2019 stop pravastatin    Disposition:  Follow up 4 weeks in the [place] office with [place].  Signed, Satira Sark, MD, [facility] 04/17/2019 9:23 AM    [place] at  [place],  [place], [place] [address] Phone: [phone]; Fax: [phone]

## 2019-04-23 ENCOUNTER — Ambulatory Visit: Payer: Medicare Other | Admitting: Student

## 2019-04-26 ENCOUNTER — Ambulatory Visit (HOSPITAL_COMMUNITY)
Admission: RE | Admit: 2019-04-26 | Discharge: 2019-04-26 | Disposition: A | Discharge status: Home / Self Care | Payer: Medicare Other | Source: Ambulatory Visit | Attending: Cardiology | Admitting: Cardiology

## 2019-04-26 ENCOUNTER — Other Ambulatory Visit: Payer: Self-pay

## 2019-04-26 DIAGNOSIS — I255 Ischemic cardiomyopathy: Secondary | ICD-10-CM

## 2019-04-26 NOTE — Progress Notes (Signed)
*  PRELIMINARY RESULTS* Echocardiogram 2D Echocardiogram has been performed.  [name] 04/26/2019, 10:55 AM

## 2019-04-29 ENCOUNTER — Telehealth: Payer: Self-pay | Admitting: *Deleted

## 2019-04-29 NOTE — Telephone Encounter (Signed)
-----   Message from Satira Sark, MD sent at 04/28/2019  2:23 PM EST ----- Results reviewed.  LVEF now 50%, improved compared to prior assessment in 2017.  Continue with current medications and follow-up plan.

## 2019-05-01 MED ORDER — ROSUVASTATIN CALCIUM 5 MG PO TABS: 5.0000 mg | ORAL_TABLET | Freq: Every day | ORAL | 3 refills | Status: AC

## 2019-05-01 MED ORDER — ISOSORBIDE MONONITRATE ER 30 MG PO TB24: 30.0000 mg | ORAL_TABLET | Freq: Every evening | ORAL | 3 refills | Status: DC

## 2019-05-01 NOTE — Telephone Encounter (Signed)
Wife informed. Copy sent to PCP

## 2019-05-03 ENCOUNTER — Ambulatory Visit: Payer: Medicare Other | Admitting: Cardiology

## 2019-05-16 ENCOUNTER — Ambulatory Visit: Payer: Medicare Other | Admitting: Student

## 2019-05-16 ENCOUNTER — Encounter: Payer: Self-pay | Admitting: Student

## 2019-05-16 ENCOUNTER — Other Ambulatory Visit: Payer: Self-pay

## 2019-05-16 VITALS — BP 135/76 | HR 74 | Temp 95.1°F | Ht 69.0 in | Wt 181.0 lb

## 2019-05-16 DIAGNOSIS — I25118 Atherosclerotic heart disease of native coronary artery with other forms of angina pectoris: Secondary | ICD-10-CM

## 2019-05-16 DIAGNOSIS — E785 Hyperlipidemia, unspecified: Secondary | ICD-10-CM

## 2019-05-16 DIAGNOSIS — I255 Ischemic cardiomyopathy: Secondary | ICD-10-CM | POA: Diagnosis not present

## 2019-05-16 DIAGNOSIS — I10 Essential (primary) hypertension: Secondary | ICD-10-CM

## 2019-05-16 MED ORDER — ISOSORBIDE MONONITRATE ER 30 MG PO TB24: 30.0000 mg | ORAL_TABLET | Freq: Two times a day (BID) | ORAL | 3 refills | Status: AC

## 2019-05-16 NOTE — Progress Notes (Signed)
Cardiology Office Note    Date:  05/16/2019   ID:  [name], DOB 1950-09-07, MRN [id number]  PCP:  Monico Blitz, MD  Cardiologist: Rozann Lesches, MD    Chief Complaint  Patient presents with  . Follow-up    1 month visit    History of Present Illness:    [name] is a 68 y.o. male with past medical history of CAD (s/p CABG in 2012 with LIMA-LAD, SVG-PLA and SVG-PDA, cath in 2017 showing occlusion of SVG-PDA and atresia of LIMA-LAD with medical management recommended), ischemic cardiomyopathy (EF 35-40% by echo in 2017, improved to 50% by repeat echo in 03/2019), HTN, HLD and dementia who presents to the office today for 4-week follow-up.   He was last examined by Dr. Domenic Polite in 03/2019 after having not followed up since 2016. He reported having baseline dyspnea on exertion but had started to experience intermittent episodes of chest discomfort which have been concerning for exertional angina. He was continued on ASA, Plavix, Lisinopril and Lopressor with Imdur 30 mg daily being added to his medication regimen. Given that his LDL had been elevated to 107 on most recent check, Pravachol was discontinued and he was started on Crestor 5 mg daily. A follow-up echocardiogram was obtained which showed his EF had actually improved to 50% but he did have wall motion abnormalities along the apical anterior segment and apical inferior segment. Was also noted to have grade 1 diastolic dysfunction, mild mitral regurgitation and mild aortic valve sclerosis without stenosis. Aortic root was mildly dilated at 38 mm.   In talking with the patient and his wife today, he reports still having some episodes of chest discomfort and dyspnea with activity but this has significantly improved since the initiation of Imdur. His wife reports he has not had to utilize SL NTG since his last visit. He does stay active at baseline and was getting up leaves the other day when he had an episode of discomfort  which resolved with rest. He denies any specific orthopnea, PND, lower extremity edema or palpitations.   Past Medical History:  Diagnosis Date  . Anxiety   . Arthritis   . CAD (coronary artery disease)    a. s/p CABG, DES to SVG to PLA, DES to RCA  b. low risk nuclear stress test 04/2014  . COPD (chronic obstructive pulmonary disease) ([place])   . Dementia ([place])   . Depression   . Dyslipidemia   . Essential hypertension   . GERD (gastroesophageal reflux disease)   . History of Bell's palsy   . Myocardial infarction ([place]) 12/2010  . Type 2 diabetes mellitus ([place])     Past Surgical History:  Procedure Laterality Date  . APPENDECTOMY    . CARDIAC CATHETERIZATION  04/15/2016  . CARDIAC CATHETERIZATION N/A 04/15/2016   Procedure: Left Heart Cath and Cors/Grafts Angiography;  Surgeon: Belva Crome, MD;  Location: [facility];  Service: Cardiovascular;  Laterality: N/A;  . CHOLECYSTECTOMY OPEN    . CORONARY ARTERY BYPASS GRAFT  01/01/2011   LIMA to LAD, SVG to PLA, SVG to PDA  . HAMMER TOE SURGERY Bilateral   . KNEE ARTHROSCOPY Left   . LEFT HEART CATHETERIZATION WITH CORONARY/GRAFT ANGIOGRAM N/A 03/01/2012   Procedure: LEFT HEART CATHETERIZATION WITH Beatrix Fetters;  Surgeon: Sherren Mocha, MD;  Location: [facility] CATH [facility];  Service: Cardiovascular;  Laterality: N/A;  . ROTATOR CUFF REPAIR Bilateral   . SHOULDER OPEN ROTATOR CUFF REPAIR Bilateral   . TONSILLECTOMY  tubes  . TYMPANOMASTOIDECTOMY Right 05/20/2013   Procedure: RIGHT CANAL UP TYMPANOMASTOIDECTOMY WITH TYPE I TYMPANOPLASTY AND INSERTION OF A TUBE;  Surgeon: Fannie Knee, MD;  Location: [facility];  Service: ENT;  Laterality: Right;    Current Medications: Outpatient Medications Prior to Visit  Medication Sig Dispense Refill  . aspirin 81 MG EC tablet Take 81 mg by mouth daily.      Marland Kitchen buPROPion (WELLBUTRIN SR) 150 MG 12 hr tablet Take 1 tablet (150 mg total) by mouth 2 (two) times daily. 180 tablet 2  .  Cholecalciferol (VITAMIN D3 PO) Take 1,000 mg by mouth 2 (two) times daily.     . clopidogrel (PLAVIX) 75 MG tablet Take 75 mg by mouth daily.    Marland Kitchen gabapentin (NEURONTIN) 300 MG capsule Take 300 mg by mouth 2 (two) times daily.    Marland Kitchen glimepiride (AMARYL) 4 MG tablet Take 4 mg by mouth daily with breakfast.    . lisinopril (PRINIVIL,ZESTRIL) 5 MG tablet Take 5 mg by mouth daily.    . metFORMIN (GLUCOPHAGE) 1000 MG tablet Take 1 tablet (1,000 mg total) by mouth 2 (two) times daily with a meal. HOLD 10/21.  May resume on 10/22.    . Multiple Vitamin (MULTIVITAMIN WITH MINERALS) TABS tablet Take 1 tablet by mouth daily.    . nitroGLYCERIN (NITROSTAT) 0.4 MG SL tablet Place 1 tablet (0.4 mg total) under the tongue every 5 (five) minutes x 3 doses as needed for chest pain (if no relief after 3 rd dose, proceed to the ED for an evaluation or call 911). For chest pain 25 tablet 0  . pantoprazole (PROTONIX) 40 MG tablet Take 40 mg by mouth every evening.     . rosuvastatin (CRESTOR) 5 MG tablet Take 1 tablet (5 mg total) by mouth daily. 90 tablet 3  . traZODone (DESYREL) 50 MG tablet Take 1 tablet (50 mg total) by mouth at bedtime. 90 tablet 2  . isosorbide mononitrate (IMDUR) 30 MG 24 hr tablet Take 1 tablet (30 mg total) by mouth every evening. 90 tablet 3  . vitamin C (ASCORBIC ACID) 500 MG tablet Take 500 mg by mouth daily.    Marland Kitchen donepezil (ARICEPT) 10 MG tablet Take 10 mg by mouth daily.    Marland Kitchen gabapentin (NEURONTIN) 100 MG capsule Take 100 mg by mouth 2 (two) times daily.     . metoprolol tartrate (LOPRESSOR) 25 MG tablet Take 25 mg by mouth daily.      No facility-administered medications prior to visit.      Allergies:   Bee venom and Codeine   Social History   Socioeconomic History  . Marital status: Married    Spouse name: Not on file  . Number of children: Not on file  . Years of education: Not on file  . Highest education level: Not on file  Occupational History  . Occupation: disabled     Comment: prior drove forklift  Social Needs  . Financial resource strain: Not on file  . Food insecurity    Worry: Not on file    Inability: Not on file  . Transportation needs    Medical: Not on file    Non-medical: Not on file  Tobacco Use  . Smoking status: Former Smoker    Packs/day: 1.00    Years: 50.00    Pack years: 50.00    Types: Cigarettes    Quit date: 12/26/2010    Years since quitting: 8.3  . Smokeless tobacco:  Never Used  Substance and Sexual Activity  . 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 Drug use: No  . Sexual activity: Not Currently  Lifestyle  . Physical activity    Days per week: Not on file    Minutes per session: Not on file  . Stress: Not on file  Relationships  . Social Herbalist on phone: Not on file    Gets together: Not on file    Attends religious service: Not on file    Active member of club or organization: Not on file    Attends meetings of clubs or organizations: Not on file    Relationship status: Not on file  Other Topics Concern  . 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   Review of Systems:   Please see the history of present illness.     General:  No chills, fever, night sweats or weight changes.  Cardiovascular:  No edema, orthopnea, palpitations, paroxysmal nocturnal dyspnea. Positive for chest pain and dyspnea on exertion.  Dermatological: No rash, lesions/masses Respiratory: No cough, dyspnea Urologic: No hematuria, dysuria Abdominal:   No nausea, vomiting, diarrhea, bright red blood per rectum, melena, or hematemesis Neurologic:  No visual changes, wkns, changes in mental status. All other  systems reviewed and are otherwise negative except as noted above.   Physical Exam:    VS:  BP 135/76   Pulse 74   Temp (!) 95.1 F (35.1 C)   Ht 5\' 9"  (1.753 m)   Wt 181 lb (82.1 kg)   SpO2 98%   BMI 26.73 kg/m    General: Well developed, well nourished,male appearing in no acute distress. Head: Normocephalic, atraumatic, sclera non-icteric, no xanthomas, nares are without discharge.  Neck: No carotid bruits. JVD not elevated.  Lungs: Respirations regular and unlabored, without wheezes or rales.  Heart: Regular rate and rhythm. No S3 or S4.  No murmur, no rubs, or gallops appreciated. Abdomen: Soft, non-tender, non-distended with normoactive bowel sounds. No hepatomegaly. No rebound/guarding. No obvious abdominal masses. Msk:  Strength and tone appear normal for age. No joint deformities or effusions. Extremities: No clubbing or cyanosis. No lower extremity edema.  Distal pedal pulses are 2+ bilaterally. Neuro: Alert and oriented X 3. Moves all extremities spontaneously. No focal deficits noted. Psych:  Responds to questions appropriately with a normal affect. Skin: No rashes or lesions noted  Wt Readings from Last 3 Encounters:  05/16/19 181 lb (82.1 kg)  04/17/19 181 lb 3.2 oz (82.2 kg)  04/13/18 180 lb (81.6 kg)     Studies/Labs Reviewed:   EKG:  EKG is not ordered today.   Recent Labs: No results found for requested labs within last 8760 hours.   Lipid Panel    Component Value Date/Time   CHOL 107 04/14/2016 1021   TRIG 144 04/14/2016 1021   HDL 26 (L) 04/14/2016 1021   CHOLHDL 4.1 04/14/2016 1021   VLDL 29 04/14/2016 1021   LDLCALC 52 04/14/2016 1021    Additional studies/ records that were reviewed today include:   Cardiac Catheterization: 03/2016  Post Atrio-2 lesion, 70 %stenosed.  Post Atrio-1 lesion,  60 %stenosed.  SVG graft was visualized by angiography and is normal in caliber.  SVG.  Origin to Prox Graft lesion, 100 %stenosed.  LIMA and is  small.  3rd RPLB lesion, 70 %stenosed.  Origin to Dist Graft lesion, 99 %stenosed.  Ost 1st Mrg to 1st Mrg lesion, 40 %stenosed.  Mid LAD to Dist LAD lesion, 60 %stenosed.  Ost 1st Diag lesion, 50 %stenosed.  The left ventricular ejection fraction is 35-45% by visual estimate.    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  SVG to RCA left ventricular branch is widely patent.  Left main is widely patent. There is segmental 50% proximal to mid LAD. Second diagonal contains 50% ostial narrowing. The first obtuse marginal contains 40% narrowing.  Mild to moderate decrease in LV function with an estimated ejection fraction of 35-40%.  When compared to the prior study, coronary and bypass graft anatomy is unchanged.  RECOMMENDATIONS:   Continue medical therapy.  Echocardiogram: 03/2019 IMPRESSIONS    1. Left ventricular ejection fraction, by visual estimation, is 50%. The left ventricle has low normal function. There is mildly increased left ventricular hypertrophy.  2. Apical anterior segment, apical inferior segment, and apex are abnormal.  3. Left ventricular diastolic parameters are consistent with Grade I diastolic dysfunction (impaired relaxation).  4. Global right ventricle has normal systolic function.The right ventricular size is normal. No increase in right ventricular wall thickness.  5. Left atrial size was normal.  6. Right atrial size was normal.  7. Mild aortic valve annular calcification.  8. Mild mitral annular calcification.  9. The mitral valve is degenerative. Mild mitral valve regurgitation. 10. The tricuspid valve is grossly normal. Tricuspid valve regurgitation is trivial. 11. The aortic valve is tricuspid. Aortic valve regurgitation is not visualized. Mild aortic valve sclerosis without stenosis. 12. There is Mild thickening of the aortic valve. 13. The pulmonic valve was grossly normal. Pulmonic  valve regurgitation is trivial. 14. Aortic dilatation noted. 15. There is mild dilatation of the aortic root measuring 38 mm. 16. The inferior vena cava is normal in size with greater than 50% respiratory variability, suggesting right atrial pressure of 3 mmHg.  Assessment:    1. Coronary artery disease of native artery of native heart with stable angina pectoris ([place])   2. Ischemic cardiomyopathy   3. Essential hypertension   4. Hyperlipidemia LDL goal <70      Plan:   In order of problems listed above:  1. CAD - he is s/p CABG in 2012 with LIMA-LAD, SVG-PLA and SVG-PDA and most recent cath in 2017 showed occlusion of SVG-PDA and atresia of LIMA-LAD which was similar to his prior cath and medical management was recommended.  - His wife contributes to his history as he does have memory loss secondary to dementia but he has reported episodes of chest discomfort with activity but this has improved since initiation of Imdur. He has not had to utilize SL NTG since his last visit.  - Reviewed options with the patient and his wife in regards to further medication management versus ischemic testing. They wish to further titrate his medications at this time and hold off on testing. His wife does report stress testing was not covered by their insurance previously and if he were to require ischemic testing in the future, they would prefer a cardiac catheterization. He is currently on Imdur 30 mg daily. Will titrate to 30 mg twice  daily as he has been taking this in the evening hours and symptoms typically are worse throughout the day. Continue ASA, Plavix and statin therapy.  2. Ischemic Cardiomyopathy - EF was previously 35-40% by echo in 2017, improved to 50% by repeat echo in 03/2019. He has baseline dyspnea on exertion but denies any recent orthopnea, PND or lower extremity edema. Unclear why not on BB therapy. Was previously on Lopressor but patient self-discontinued. Continue Lisinopril.   3.  HTN - BP is well controlled at 135/76 during today's visit. Continue Lisinopril 5 mg daily with titration of Imdur as outlined above.  4. HLD - LDL was elevated to 107 by most recent labs in 08/2018 and he was switched to Crestor 5 mg daily during his last office visit. They report having upcoming labs with his PCP within the next 2 months and wish to have repeat FLP and LFT's obtained at that time. Goal LDL is less than 70. If still above goal, would titrate Crestor to 10mg  daily.     Medication Adjustments/Labs and Tests Ordered: Current medicines are reviewed at length with the patient today.  Concerns regarding medicines are outlined above.  Medication changes, Labs and Tests ordered today are listed in the Patient Instructions below. Patient Instructions  Medication Instructions:  INCREASE Imdur 30 mg twice a day  *If you need a refill on your cardiac medications before your next appointment, please call your pharmacy*  Lab Work: None today If you have labs (blood work) drawn today and your tests are completely normal, you will receive your results only by: Marland Kitchen MyChart Message (if you have MyChart) OR . A paper copy in the mail If you have any lab test that is abnormal or we need to change your treatment, we will call you to review the results.  Testing/Procedures: None today  Follow-Up: At [facility], you and your health needs are our priority.  As part of our continuing mission to provide you with exceptional heart care, we have created designated Provider Care Teams.  These Care Teams include your primary Cardiologist (physician) and Advanced Practice Providers (APPs -  Physician Assistants and Nurse Practitioners) who all work together to provide you with the care you need, when you need it.  Your next appointment:   2 - 3 months  The format for your next appointment:   In Person  Provider:   You may see Rozann Lesches, MD or one of the following Advanced Practice  Providers on your designated Care Team:    Bernerd Pho, PA-C      Other Instructions None today  Thank you for choosing [facility] !         Signed, Erma Heritage, PA-C  05/16/2019 5:13 PM    [facility] [address] [place], [place] [address] Phone: [phone] Fax: [phone]

## 2019-05-16 NOTE — Patient Instructions (Signed)
Medication Instructions:  INCREASE Imdur 30 mg twice a day  *If you need a refill on your cardiac medications before your next appointment, please call your pharmacy*  Lab Work: None today If you have labs (blood work) drawn today and your tests are completely normal, you will receive your results only by: Marland Kitchen MyChart Message (if you have MyChart) OR . A paper copy in the mail If you have any lab test that is abnormal or we need to change your treatment, we will call you to review the results.  Testing/Procedures: None today  Follow-Up: At [facility], you and your health needs are our priority.  As part of our continuing mission to provide you with exceptional heart care, we have created designated Provider Care Teams.  These Care Teams include your primary Cardiologist (physician) and Advanced Practice Providers (APPs -  Physician Assistants and Nurse Practitioners) who all work together to provide you with the care you need, when you need it.  Your next appointment:   2 - 3 months  The format for your next appointment:   In Person  Provider:   You may see Rozann Lesches, MD or one of the following Advanced Practice Providers on your designated Care Team:    Bernerd Pho, PA-C      Other Instructions None today       Thank you for choosing [facility] !

## 2019-06-06 ENCOUNTER — Encounter (INDEPENDENT_AMBULATORY_CARE_PROVIDER_SITE_OTHER): Payer: Self-pay | Admitting: Otolaryngology

## 2019-06-06 ENCOUNTER — Ambulatory Visit (INDEPENDENT_AMBULATORY_CARE_PROVIDER_SITE_OTHER): Payer: Medicare Other | Admitting: Otolaryngology

## 2019-06-06 ENCOUNTER — Other Ambulatory Visit: Payer: Self-pay

## 2019-06-06 VITALS — Temp 97.5°F

## 2019-06-06 DIAGNOSIS — H90A11 Conductive hearing loss, unilateral, right ear with restricted hearing on the contralateral side: Secondary | ICD-10-CM | POA: Diagnosis not present

## 2019-06-06 NOTE — Progress Notes (Signed)
HPI: [name] is a 68 y.o. male who presents is referred by hearing life and [place] for evaluation of hearing.  Patient has had hearing aids for over 3 years.  On his most recent hearing test performed 2 weeks ago he apparently had developed a 20-25 dB conductive hearing loss in the right ear.  He has downsloping sensorineural hearing loss in both ears.  He also complains of increase tinnitus in the right ear.  SRT's were 50 dB on the right and 40 dB on the left..  He has had previous tubes placed in his ears years ago because of fluid or ear infections.  Past Medical History:  Diagnosis Date  . Anxiety   . Arthritis   . CAD (coronary artery disease)    a. s/p CABG, DES to SVG to PLA, DES to RCA  b. low risk nuclear stress test 04/2014  . COPD (chronic obstructive pulmonary disease) ([place])   . Dementia ([place])   . Depression   . Dyslipidemia   . Essential hypertension   . GERD (gastroesophageal reflux disease)   . History of Bell's palsy   . Myocardial infarction ([place]) 12/2010  . Type 2 diabetes mellitus ([place])    Past Surgical History:  Procedure Laterality Date  . APPENDECTOMY    . CARDIAC CATHETERIZATION  04/15/2016  . CARDIAC CATHETERIZATION N/A 04/15/2016   Procedure: Left Heart Cath and Cors/Grafts Angiography;  Surgeon: Belva Crome, MD;  Location: [facility];  Service: Cardiovascular;  Laterality: N/A;  . CHOLECYSTECTOMY OPEN    . CORONARY ARTERY BYPASS GRAFT  01/01/2011   LIMA to LAD, SVG to PLA, SVG to PDA  . HAMMER TOE SURGERY Bilateral   . KNEE ARTHROSCOPY Left   . LEFT HEART CATHETERIZATION WITH CORONARY/GRAFT ANGIOGRAM N/A 03/01/2012   Procedure: LEFT HEART CATHETERIZATION WITH Beatrix Fetters;  Surgeon: Sherren Mocha, MD;  Location: [facility] C[facility];  Service: Cardiovascular;  Laterality: N/A;  . ROTATOR CUFF REPAIR Bilateral   . SHOULDER OPEN ROTATOR CUFF REPAIR Bilateral   . TONSILLECTOMY     tubes  . TYMPANOMASTOIDECTOMY Right 05/20/2013   Procedure: RIGHT CANAL UP TYMPANOMASTOIDECTOMY WITH TYPE I TYMPANOPLASTY AND INSERTION OF A TUBE;  Surgeon: Fannie Knee, MD;  Location: [facility];  Service: ENT;  Laterality: Right;   Social History   Socioeconomic History  . Marital status: Married    Spouse name: Not on file  . Number of children: Not on file  . Years of education: Not on file  . Highest education level: Not on file  Occupational History  . Occupation: disabled    Comment: prior drove forklift  Tobacco Use  . Smoking status: Former Smoker    Packs/day: 1.00    Years: 50.00    Pack years: 50.00    Types: Cigarettes    Quit date: 12/26/2010    Years since quitting: 8.4  . Smokeless tobacco: Never Used  Substance and Sexual Activity  . 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 Drug use: No  . Sexual activity: Not Currently  Other Topics Concern  . 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 Difficulty of Paying Living Expenses: Not on file  Food Insecurity:   . Worried About Charity fundraiser in the  Last Year: Not on file  . Ran Out of Food in the Last Year: Not on file  Transportation Needs:   . Lack of Transportation (Medical): Not on file  . Lack of Transportation (Non-Medical): Not on file  Physical Activity:   . Days of Exercise per Week: Not on file  . Minutes of Exercise per Session: Not on file  Stress:   . Feeling of Stress : Not on file  Social Connections:   . Frequency of Communication with Friends and Family: Not on file  . Frequency of Social Gatherings with Friends and Family: Not on file  . Attends Religious Services: Not on file  . Active Member of Clubs or Organizations: Not on file  . Attends Archivist Meetings: Not on file  . Marital Status: Not on file   Family History  Problem Relation Age  of Onset  . Alcohol abuse Brother   . Diabetes Brother   . Heart disease Mother   . Glaucoma Mother   . Heart disease Father   . Diabetes Father   . Diabetes Sister    Allergies  Allergen Reactions  . Bee Venom Anaphylaxis  . Codeine Shortness Of Breath and Other (See Comments)   Prior to Admission medications   Medication Sig Start Date End Date Taking? Authorizing Provider  aspirin 81 MG EC tablet Take 81 mg by mouth daily.     Yes [provider]  buPROPion (WELLBUTRIN SR) 150 MG 12 hr tablet Take 1 tablet (150 mg total) by mouth 2 (two) times daily. 05/14/15  Yes Cloria Spring, MD  Cholecalciferol (VITAMIN D3 PO) Take 1,000 mg by mouth 2 (two) times daily.    Yes [provider]  clopidogrel (PLAVIX) 75 MG tablet Take 75 mg by mouth daily.   Yes [provider]  gabapentin (NEURONTIN) 300 MG capsule Take 300 mg by mouth 2 (two) times daily.   Yes [provider]  glimepiride (AMARYL) 4 MG tablet Take 4 mg by mouth daily with breakfast.   Yes [provider]  isosorbide mononitrate (IMDUR) 30 MG 24 hr tablet Take 1 tablet (30 mg total) by mouth 2 (two) times daily. 05/16/19 08/14/19 Yes Strader, Fransisco Hertz, PA-C  lisinopril (PRINIVIL,ZESTRIL) 5 MG tablet Take 5 mg by mouth daily.   Yes [provider]  metFORMIN (GLUCOPHAGE) 1000 MG tablet Take 1 tablet (1,000 mg total) by mouth 2 (two) times daily with a meal. HOLD 10/21.  May resume on 10/22. 04/16/16  Yes Rama, Venetia Maxon, MD  Multiple Vitamin (MULTIVITAMIN WITH MINERALS) TABS tablet Take 1 tablet by mouth daily.   Yes [provider]  nitroGLYCERIN (NITROSTAT) 0.4 MG SL tablet Place 1 tablet (0.4 mg total) under the tongue every 5 (five) minutes x 3 doses as needed for chest pain (if no relief after 3 rd dose, proceed to the ED for an evaluation or call 911). For chest pain 03/22/19  Yes Satira Sark, MD  pantoprazole (PROTONIX) 40 MG tablet Take 40 mg by mouth  every evening.    Yes [provider]  rosuvastatin (CRESTOR) 5 MG tablet Take 1 tablet (5 mg total) by mouth daily. 05/01/19 07/30/19 Yes Satira Sark, MD  traZODone (DESYREL) 50 MG tablet Take 1 tablet (50 mg total) by mouth at bedtime. 05/14/15  Yes Cloria Spring, MD  vitamin C (ASCORBIC ACID) 500 MG tablet Take 500 mg by mouth daily.   Yes [provider]  Positive ROS: Negative  All other systems have been reviewed and were otherwise negative with the exception of those mentioned in the HPI and as above.  Physical Exam: Constitutional: Alert, well-appearing, no acute distress.  Patient with bilateral hearing aids. Ears: External ears without lesions or tenderness.  Hello bit of wax in the right ear canal that was cleaned but this was nonobstructing.  He had scar tissue on the TM but it appears relatively mobile.  He has a thin membrane inferiorly where previous myringotomy tubes were placed.  I performed a myringotomy in the office today to see if he had a middle ear effusion but the middle ear space was dry. On tuning fork testing AC > BC on the left.  On the right BC was only slightly better and AC.  He really only noticed minimal gross hearing difference using the 512 tuning fork subjectively in both ears. Nasal: External nose without lesions. Septum moderate deformity.. Clear nasal passages.  No signs of infection. Oral: Lips and gums without lesions. Tongue and palate mucosa without lesions. Posterior oropharynx clear. Neck: No palpable adenopathy or masses Respiratory: Breathing comfortably  Skin: No facial/neck lesions or rash noted.  Myringotomy  Date/Time: 06/06/2019 5:07 PM Performed by: Rozetta Nunnery, MD Authorized by: Rozetta Nunnery, MD   Consent:    Consent obtained:  Verbal   Consent given by:  Patient   Risks discussed:  Bleeding and pain   Alternatives discussed:  No treatment Anesthesia:    Anesthesia method:  Topical  application   Topical anesthetic:  Phenol Procedure Details:    Location:  Right TM   Hole made in:  Inferior aspect of TM Findings:    Fluid:  No fluid Post-procedure details:    Patient tolerance of procedure:  Tolerated well, no immediate complications Comments:     Myringotomy was performed on the right side.  Middle ear space was dry.    Assessment: Right ear conductive hearing loss.  Probably ossicular in nature. Tinnitus on the right  Plan: Placed him on Lipo flavonoid to help with the tinnitus. Discussed possible options of exploratory surgery to try and improve the conductive hearing loss versus use of a stronger hearing aid.  He will initially try stronger hearing aid and follow-up in 5 to 6 months for recheck and repeat audiologic testing.   Radene Journey, MD   CC:

## 2019-06-25 ENCOUNTER — Encounter (INDEPENDENT_AMBULATORY_CARE_PROVIDER_SITE_OTHER): Payer: Self-pay

## 2019-06-26 ENCOUNTER — Encounter (INDEPENDENT_AMBULATORY_CARE_PROVIDER_SITE_OTHER): Payer: Self-pay

## 2019-07-16 ENCOUNTER — Other Ambulatory Visit: Payer: Self-pay

## 2019-07-16 ENCOUNTER — Ambulatory Visit (INDEPENDENT_AMBULATORY_CARE_PROVIDER_SITE_OTHER): Payer: Medicare Other | Admitting: Cardiology

## 2019-07-16 ENCOUNTER — Encounter: Payer: Self-pay | Admitting: Cardiology

## 2019-07-16 VITALS — BP 144/70 | HR 87 | Ht 68.0 in | Wt 184.0 lb

## 2019-07-16 DIAGNOSIS — I10 Essential (primary) hypertension: Secondary | ICD-10-CM | POA: Diagnosis not present

## 2019-07-16 DIAGNOSIS — I255 Ischemic cardiomyopathy: Secondary | ICD-10-CM

## 2019-07-16 DIAGNOSIS — E782 Mixed hyperlipidemia: Secondary | ICD-10-CM | POA: Diagnosis not present

## 2019-07-16 DIAGNOSIS — I25118 Atherosclerotic heart disease of native coronary artery with other forms of angina pectoris: Secondary | ICD-10-CM | POA: Diagnosis not present

## 2019-07-16 MED ORDER — METOPROLOL SUCCINATE ER 25 MG PO TB24: 25.0000 mg | ORAL_TABLET | Freq: Every day | ORAL | 2 refills | Status: DC

## 2019-07-16 NOTE — Patient Instructions (Addendum)
Medication Instructions:   Your physician has recommended you make the following change in your medication:   Start metoprolol succinate 25 mg one by mouth daily  Continue other medications the same  Labwork:  NONE  Testing/Procedures:  NONE  Follow-Up:  Your physician recommends that you schedule a follow-up appointment in: 6 months (office). You will receive a reminder letter in the mail in about 4 months reminding you to call and schedule your appointment. If you don't receive this letter, please contact our office.  Any Other Special Instructions Will Be Listed Below (If Applicable).  If you need a refill on your cardiac medications before your next appointment, please call your pharmacy.

## 2019-07-16 NOTE — Progress Notes (Signed)
Cardiology Office Note  Date: 07/16/2019   ID: [name], DOB [birth year], MRN [id number]  PCP:  Monico Blitz, MD  Cardiologist:  Rozann Lesches, MD Electrophysiologist:  None   Chief Complaint  Patient presents with  . Cardiac follow-up    History of Present Illness: [name] is a 69 y.o. male November 2020 by Ms. Strader PA-C.  He presents with his wife for a follow-up visit. Imdur was increased to 30 mg twice daily at the last visit.  He states that he has not had recurring chest pain so far on present regimen.  Follow-up echocardiogram in October 2020 showed improvement in LVEF to approximately 50% in comparison to prior assessment, mild diastolic dysfunction, sclerotic aortic valve without stenosis.  His current cardiac medications include aspirin, Plavix, Imdur, lisinopril, Crestor.  Patient decided to stop Lopressor.  Past Medical History:  Diagnosis Date  . Anxiety   . Arthritis   . CAD (coronary artery disease)    a. s/p CABG, DES to SVG to PLA, DES to RCA  b. low risk nuclear stress test 04/2014  . COPD (chronic obstructive pulmonary disease) ([place])   . Dementia ([place])   . Depression   . Dyslipidemia   . Essential hypertension   . GERD (gastroesophageal reflux disease)   . History of Bell's palsy   . Myocardial infarction ([place]) 12/2010  . Type 2 diabetes mellitus ([place])     Past Surgical History:  Procedure Laterality Date  . APPENDECTOMY    . CARDIAC CATHETERIZATION  04/15/2016  . CARDIAC CATHETERIZATION N/A 04/15/2016   Procedure: Left Heart Cath and Cors/Grafts Angiography;  Surgeon: Belva Crome, MD;  Location: [place] [facility];  Service: Cardiovascular;  Laterality: N/A;  . CHOLECYSTECTOMY OPEN    . CORONARY ARTERY BYPASS GRAFT  01/01/2011   LIMA to LAD, SVG to PLA, SVG to PDA  . HAMMER TOE SURGERY Bilateral   . KNEE ARTHROSCOPY Left   . LEFT HEART CATHETERIZATION WITH CORONARY/GRAFT ANGIOGRAM N/A 03/01/2012   Procedure: LEFT HEART  CATHETERIZATION WITH Beatrix Fetters;  Surgeon: Sherren Mocha, MD;  Location: [facility] CATH LAB;  Service: Cardiovascular;  Laterality: N/A;  . ROTATOR CUFF REPAIR Bilateral   . SHOULDER OPEN ROTATOR CUFF REPAIR Bilateral   . TONSILLECTOMY     tubes  . TYMPANOMASTOIDECTOMY Right 05/20/2013   Procedure: RIGHT CANAL UP TYMPANOMASTOIDECTOMY WITH TYPE I TYMPANOPLASTY AND INSERTION OF A TUBE;  Surgeon: Fannie Knee, MD;  Location: [facility];  Service: ENT;  Laterality: Right;    Current Outpatient Medications  Medication Sig Dispense Refill  . aspirin 81 MG EC tablet Take 81 mg by mouth daily.      Marland Kitchen buPROPion (WELLBUTRIN SR) 150 MG 12 hr tablet Take 1 tablet (150 mg total) by mouth 2 (two) times daily. 180 tablet 2  . Cholecalciferol (VITAMIN D3 PO) Take 1,000 mg by mouth 2 (two) times daily.     . clopidogrel (PLAVIX) 75 MG tablet Take 75 mg by mouth daily.    Marland Kitchen gabapentin (NEURONTIN) 300 MG capsule Take 300 mg by mouth 2 (two) times daily.    Marland Kitchen glimepiride (AMARYL) 4 MG tablet Take 4 mg by mouth daily with breakfast.    . isosorbide mononitrate (IMDUR) 30 MG 24 hr tablet Take 1 tablet (30 mg total) by mouth 2 (two) times daily. 180 tablet 3  . lisinopril (PRINIVIL,ZESTRIL) 5 MG tablet Take 5 mg by mouth daily.    . metFORMIN (GLUCOPHAGE) 1000 MG tablet  Take 1 tablet (1,000 mg total) by mouth 2 (two) times daily with a meal. HOLD 10/21.  May resume on 10/22.    . Multiple Vitamin (MULTIVITAMIN WITH MINERALS) TABS tablet Take 1 tablet by mouth daily.    . nitroGLYCERIN (NITROSTAT) 0.4 MG SL tablet Place 1 tablet (0.4 mg total) under the tongue every 5 (five) minutes x 3 doses as needed for chest pain (if no relief after 3 rd dose, proceed to the ED for an evaluation or call 911). For chest pain 25 tablet 0  . pantoprazole (PROTONIX) 40 MG tablet Take 40 mg by mouth every evening.     . rosuvastatin (CRESTOR) 5 MG tablet Take 1 tablet (5 mg total) by mouth daily. 90 tablet 3  . traZODone (DESYREL)  50 MG tablet Take 1 tablet (50 mg total) by mouth at bedtime. 90 tablet 2  . vitamin C (ASCORBIC ACID) 500 MG tablet Take 500 mg by mouth daily.    . metoprolol succinate (TOPROL XL) 25 MG 24 hr tablet Take 1 tablet (25 mg total) by mouth daily. 90 tablet 2   No current facility-administered medications for this visit.   Allergies:  Bee venom and Codeine   Social History: The patient  reports that he quit smoking about 8 years ago. His smoking use included cigarettes. He has a 50.00 pack-year smoking history. He has never used smokeless tobacco. He reports current alcohol use. He reports that he does not use drugs.   ROS:  Please see the history of present illness. Otherwise, complete review of systems is positive for memory loss, arthritic pain, uses a cane.  All other systems are reviewed and negative.   Physical Exam: VS:  BP (!) 144/70   Pulse 87   Ht 5\' 8"  (1.727 m)   Wt 184 lb (83.5 kg)   SpO2 98%   BMI 27.98 kg/m , BMI Body mass index is 27.98 kg/m.  Wt Readings from Last 3 Encounters:  07/16/19 184 lb (83.5 kg)  05/16/19 181 lb (82.1 kg)  04/17/19 181 lb 3.2 oz (82.2 kg)    General: Chronically ill-appearing male, appears comfortable at rest. HEENT: Conjunctiva and lids normal, wearing a mask. Neck: Supple, no elevated JVP or carotid bruits, no thyromegaly. Lungs: Clear to auscultation, nonlabored breathing at rest. Cardiac: Regular rate and rhythm, no S3, soft systolic murmur. Abdomen: Soft, nontender, bowel sounds present. Extremities: No pitting edema, distal pulses 2+. Skin: Warm and dry. Musculoskeletal: No kyphosis. Neuropsychiatric: Alert and oriented x3, affect grossly appropriate.  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  Recent Labwork:  July 2020: Hemoglobin A1c 7%, BUN 13, creatinine 1.18, potassium 4.7, AST 31, ALT 33, cholesterol 183,  triglycerides 178, HDL 40, LDL 107, hemoglobin 11.2, platelets 318, TSH 1.99  Other Studies Reviewed Today:  Echocardiogram 04/26/2019:  1. Left ventricular ejection fraction, by visual estimation, is 50%. The left ventricle has low normal function. There is mildly increased left ventricular hypertrophy.  2. Apical anterior segment, apical inferior segment, and apex are abnormal.  3. Left ventricular diastolic parameters are consistent with Grade I diastolic dysfunction (impaired relaxation).  4. Global right ventricle has normal systolic function.The right ventricular size is normal. No increase in right ventricular wall thickness.  5. Left atrial size was normal.  6. Right atrial size was normal.  7. Mild aortic valve annular calcification.  8. Mild mitral  annular calcification.  9. The mitral valve is degenerative. Mild mitral valve regurgitation. 10. The tricuspid valve is grossly normal. Tricuspid valve regurgitation is trivial. 11. The aortic valve is tricuspid. Aortic valve regurgitation is not visualized. Mild aortic valve sclerosis without stenosis. 12. There is Mild thickening of the aortic valve. 13. The pulmonic valve was grossly normal. Pulmonic valve regurgitation is trivial. 14. Aortic dilatation noted. 15. There is mild dilatation of the aortic root measuring 38 mm. 16. The inferior vena cava is normal in size with greater than 50% respiratory variability, suggesting right atrial pressure of 3 mmHg.  Cardiac catheterization 04/15/2016:  Post Atrio-2 lesion, 70 %stenosed.  Post Atrio-1 lesion, 60 %stenosed.  SVG graft was visualized by angiography and is normal in caliber.  SVG.  Origin to Prox Graft lesion, 100 %stenosed.  LIMA and is small.  3rd RPLB lesion, 70 %stenosed.  Origin to Dist Graft lesion, 99 %stenosed.  Ost 1st Mrg to 1st Mrg lesion, 40 %stenosed.  Mid LAD to Dist LAD lesion, 60 %stenosed.  Ost 1st Diag lesion, 50 %stenosed.  The left  ventricular ejection fraction is 35-45% by visual estimate.   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  SVG to RCA left ventricular branch is widely patent.  Left main is widely patent. There is segmental 50% proximal to mid LAD. Second diagonal contains 50% ostial narrowing. The first obtuse marginal contains 40% narrowing.  Mild to moderate decrease in LV function with an estimated ejection fraction of 35-40%.  When compared to the prior study, coronary and bypass graft anatomy is unchanged.  Assessment and Plan:  1.  Multivessel CAD status post CABG with documented graft disease in 2017 and plan for medical therapy as discussed previously.  He reports improvement in angina symptoms on current medical regimen although did decide to stop Lopressor.  Continue with present medications, and add Toprol-XL 25 mg daily.  2.  Ischemic cardiomyopathy with improvement in LVEF to the range of 50%.  3.  Mixed hyperlipidemia, tolerating Crestor.  We will plan follow-up assessment of lipids around next visit if not checked by PCP in the interim.  Medication Adjustments/Labs and Tests Ordered: Current medicines are reviewed at length with the patient today.  Concerns regarding medicines are outlined above.   Tests Ordered: No orders of the defined types were placed in this encounter.   Medication Changes: Meds ordered this encounter  Medications  . metoprolol succinate (TOPROL XL) 25 MG 24 hr tablet    Sig: Take 1 tablet (25 mg total) by mouth daily.    Dispense:  90 tablet    Refill:  2    07/16/2019 NEW    Disposition:  Follow up 6 months in the [place] office.  Signed, Satira Sark, MD, [facility] 07/16/2019 4:49 PM    [facility] [place], [place], [place] [address] Phone: [phone]; Fax: [phone]

## 2019-07-22 DIAGNOSIS — Z87891 Personal history of nicotine dependence: Secondary | ICD-10-CM | POA: Diagnosis not present

## 2019-07-22 DIAGNOSIS — E1165 Type 2 diabetes mellitus with hyperglycemia: Secondary | ICD-10-CM | POA: Diagnosis not present

## 2019-07-22 DIAGNOSIS — I208 Other forms of angina pectoris: Secondary | ICD-10-CM | POA: Diagnosis not present

## 2019-07-22 DIAGNOSIS — M1712 Unilateral primary osteoarthritis, left knee: Secondary | ICD-10-CM | POA: Diagnosis not present

## 2019-07-22 DIAGNOSIS — I10 Essential (primary) hypertension: Secondary | ICD-10-CM | POA: Diagnosis not present

## 2019-07-22 DIAGNOSIS — Z299 Encounter for prophylactic measures, unspecified: Secondary | ICD-10-CM | POA: Diagnosis not present

## 2019-08-07 DIAGNOSIS — I10 Essential (primary) hypertension: Secondary | ICD-10-CM | POA: Diagnosis not present

## 2019-08-07 DIAGNOSIS — E7800 Pure hypercholesterolemia, unspecified: Secondary | ICD-10-CM | POA: Diagnosis not present

## 2019-08-07 DIAGNOSIS — E119 Type 2 diabetes mellitus without complications: Secondary | ICD-10-CM | POA: Diagnosis not present

## 2019-09-02 DIAGNOSIS — I10 Essential (primary) hypertension: Secondary | ICD-10-CM | POA: Diagnosis not present

## 2019-09-02 DIAGNOSIS — E1165 Type 2 diabetes mellitus with hyperglycemia: Secondary | ICD-10-CM | POA: Diagnosis not present

## 2019-09-02 DIAGNOSIS — Z299 Encounter for prophylactic measures, unspecified: Secondary | ICD-10-CM | POA: Diagnosis not present

## 2019-09-02 DIAGNOSIS — Z87891 Personal history of nicotine dependence: Secondary | ICD-10-CM | POA: Diagnosis not present

## 2019-09-03 DIAGNOSIS — E1165 Type 2 diabetes mellitus with hyperglycemia: Secondary | ICD-10-CM | POA: Diagnosis not present

## 2019-09-11 DIAGNOSIS — R42 Dizziness and giddiness: Secondary | ICD-10-CM | POA: Diagnosis not present

## 2019-09-16 DIAGNOSIS — I10 Essential (primary) hypertension: Secondary | ICD-10-CM | POA: Diagnosis not present

## 2019-09-16 DIAGNOSIS — Z299 Encounter for prophylactic measures, unspecified: Secondary | ICD-10-CM | POA: Diagnosis not present

## 2019-09-16 DIAGNOSIS — Z7189 Other specified counseling: Secondary | ICD-10-CM | POA: Diagnosis not present

## 2019-09-16 DIAGNOSIS — Z0000 Encounter for general adult medical examination without abnormal findings: Secondary | ICD-10-CM | POA: Diagnosis not present

## 2019-09-16 DIAGNOSIS — Z1211 Encounter for screening for malignant neoplasm of colon: Secondary | ICD-10-CM | POA: Diagnosis not present

## 2019-09-23 DIAGNOSIS — Z87891 Personal history of nicotine dependence: Secondary | ICD-10-CM | POA: Diagnosis not present

## 2019-09-23 DIAGNOSIS — M25561 Pain in right knee: Secondary | ICD-10-CM | POA: Diagnosis not present

## 2019-09-23 DIAGNOSIS — M25562 Pain in left knee: Secondary | ICD-10-CM | POA: Diagnosis not present

## 2019-09-23 DIAGNOSIS — R262 Difficulty in walking, not elsewhere classified: Secondary | ICD-10-CM | POA: Diagnosis not present

## 2019-09-23 DIAGNOSIS — I10 Essential (primary) hypertension: Secondary | ICD-10-CM | POA: Diagnosis not present

## 2019-09-23 DIAGNOSIS — M2560 Stiffness of unspecified joint, not elsewhere classified: Secondary | ICD-10-CM | POA: Diagnosis not present

## 2019-09-23 DIAGNOSIS — Z299 Encounter for prophylactic measures, unspecified: Secondary | ICD-10-CM | POA: Diagnosis not present

## 2019-09-23 DIAGNOSIS — E1165 Type 2 diabetes mellitus with hyperglycemia: Secondary | ICD-10-CM | POA: Diagnosis not present

## 2019-10-07 DIAGNOSIS — Z299 Encounter for prophylactic measures, unspecified: Secondary | ICD-10-CM | POA: Diagnosis not present

## 2019-10-07 DIAGNOSIS — Z87891 Personal history of nicotine dependence: Secondary | ICD-10-CM | POA: Diagnosis not present

## 2019-10-07 DIAGNOSIS — I10 Essential (primary) hypertension: Secondary | ICD-10-CM | POA: Diagnosis not present

## 2019-10-07 DIAGNOSIS — R262 Difficulty in walking, not elsewhere classified: Secondary | ICD-10-CM | POA: Diagnosis not present

## 2019-10-07 DIAGNOSIS — M2560 Stiffness of unspecified joint, not elsewhere classified: Secondary | ICD-10-CM | POA: Diagnosis not present

## 2019-10-07 DIAGNOSIS — M170 Bilateral primary osteoarthritis of knee: Secondary | ICD-10-CM | POA: Diagnosis not present

## 2019-10-14 DIAGNOSIS — R195 Other fecal abnormalities: Secondary | ICD-10-CM | POA: Diagnosis not present

## 2019-10-25 DIAGNOSIS — Z299 Encounter for prophylactic measures, unspecified: Secondary | ICD-10-CM | POA: Diagnosis not present

## 2019-10-25 DIAGNOSIS — G309 Alzheimer's disease, unspecified: Secondary | ICD-10-CM | POA: Diagnosis not present

## 2019-10-25 DIAGNOSIS — E1165 Type 2 diabetes mellitus with hyperglycemia: Secondary | ICD-10-CM | POA: Diagnosis not present

## 2019-10-25 DIAGNOSIS — I10 Essential (primary) hypertension: Secondary | ICD-10-CM | POA: Diagnosis not present

## 2019-11-05 ENCOUNTER — Ambulatory Visit (INDEPENDENT_AMBULATORY_CARE_PROVIDER_SITE_OTHER): Payer: Medicare Other | Admitting: Otolaryngology

## 2019-11-05 DIAGNOSIS — Z01818 Encounter for other preprocedural examination: Secondary | ICD-10-CM | POA: Diagnosis not present

## 2019-11-07 DIAGNOSIS — Z7982 Long term (current) use of aspirin: Secondary | ICD-10-CM | POA: Diagnosis not present

## 2019-11-07 DIAGNOSIS — Z951 Presence of aortocoronary bypass graft: Secondary | ICD-10-CM | POA: Diagnosis not present

## 2019-11-07 DIAGNOSIS — Z79899 Other long term (current) drug therapy: Secondary | ICD-10-CM | POA: Diagnosis not present

## 2019-11-07 DIAGNOSIS — Z91030 Bee allergy status: Secondary | ICD-10-CM | POA: Diagnosis not present

## 2019-11-07 DIAGNOSIS — Q438 Other specified congenital malformations of intestine: Secondary | ICD-10-CM | POA: Diagnosis not present

## 2019-11-07 DIAGNOSIS — K219 Gastro-esophageal reflux disease without esophagitis: Secondary | ICD-10-CM | POA: Diagnosis not present

## 2019-11-07 DIAGNOSIS — I2510 Atherosclerotic heart disease of native coronary artery without angina pectoris: Secondary | ICD-10-CM | POA: Diagnosis not present

## 2019-11-07 DIAGNOSIS — Z9842 Cataract extraction status, left eye: Secondary | ICD-10-CM | POA: Diagnosis not present

## 2019-11-07 DIAGNOSIS — K6389 Other specified diseases of intestine: Secondary | ICD-10-CM | POA: Diagnosis not present

## 2019-11-07 DIAGNOSIS — K5730 Diverticulosis of large intestine without perforation or abscess without bleeding: Secondary | ICD-10-CM | POA: Diagnosis not present

## 2019-11-07 DIAGNOSIS — R195 Other fecal abnormalities: Secondary | ICD-10-CM | POA: Diagnosis not present

## 2019-11-07 DIAGNOSIS — Z886 Allergy status to analgesic agent status: Secondary | ICD-10-CM | POA: Diagnosis not present

## 2019-11-07 DIAGNOSIS — Z7902 Long term (current) use of antithrombotics/antiplatelets: Secondary | ICD-10-CM | POA: Diagnosis not present

## 2019-11-07 DIAGNOSIS — Z7984 Long term (current) use of oral hypoglycemic drugs: Secondary | ICD-10-CM | POA: Diagnosis not present

## 2019-11-07 DIAGNOSIS — I252 Old myocardial infarction: Secondary | ICD-10-CM | POA: Diagnosis not present

## 2019-11-07 DIAGNOSIS — Z9841 Cataract extraction status, right eye: Secondary | ICD-10-CM | POA: Diagnosis not present

## 2019-11-07 DIAGNOSIS — Z955 Presence of coronary angioplasty implant and graft: Secondary | ICD-10-CM | POA: Diagnosis not present

## 2019-11-07 DIAGNOSIS — E119 Type 2 diabetes mellitus without complications: Secondary | ICD-10-CM | POA: Diagnosis not present

## 2019-11-07 DIAGNOSIS — Z9049 Acquired absence of other specified parts of digestive tract: Secondary | ICD-10-CM | POA: Diagnosis not present

## 2019-11-07 DIAGNOSIS — I10 Essential (primary) hypertension: Secondary | ICD-10-CM | POA: Diagnosis not present

## 2019-12-03 DIAGNOSIS — R454 Irritability and anger: Secondary | ICD-10-CM | POA: Diagnosis not present

## 2019-12-03 DIAGNOSIS — E7800 Pure hypercholesterolemia, unspecified: Secondary | ICD-10-CM | POA: Diagnosis not present

## 2019-12-03 DIAGNOSIS — E1165 Type 2 diabetes mellitus with hyperglycemia: Secondary | ICD-10-CM | POA: Diagnosis not present

## 2019-12-03 DIAGNOSIS — Z299 Encounter for prophylactic measures, unspecified: Secondary | ICD-10-CM | POA: Diagnosis not present

## 2019-12-03 DIAGNOSIS — I10 Essential (primary) hypertension: Secondary | ICD-10-CM | POA: Diagnosis not present

## 2019-12-16 ENCOUNTER — Other Ambulatory Visit: Payer: Self-pay

## 2019-12-16 ENCOUNTER — Ambulatory Visit (INDEPENDENT_AMBULATORY_CARE_PROVIDER_SITE_OTHER): Payer: Medicare Other | Admitting: Cardiology

## 2019-12-16 ENCOUNTER — Other Ambulatory Visit: Payer: Self-pay | Admitting: Cardiology

## 2019-12-16 ENCOUNTER — Encounter: Payer: Self-pay | Admitting: Cardiology

## 2019-12-16 ENCOUNTER — Telehealth: Payer: Self-pay | Admitting: Cardiology

## 2019-12-16 ENCOUNTER — Encounter: Payer: Self-pay | Admitting: *Deleted

## 2019-12-16 VITALS — BP 138/80 | HR 63 | Ht 68.0 in | Wt 176.0 lb

## 2019-12-16 DIAGNOSIS — I25118 Atherosclerotic heart disease of native coronary artery with other forms of angina pectoris: Secondary | ICD-10-CM | POA: Diagnosis not present

## 2019-12-16 DIAGNOSIS — I200 Unstable angina: Secondary | ICD-10-CM

## 2019-12-16 DIAGNOSIS — E782 Mixed hyperlipidemia: Secondary | ICD-10-CM | POA: Diagnosis not present

## 2019-12-16 DIAGNOSIS — I255 Ischemic cardiomyopathy: Secondary | ICD-10-CM | POA: Diagnosis not present

## 2019-12-16 MED ORDER — NITROGLYCERIN 0.4 MG SL SUBL: 0.4000 mg | SUBLINGUAL_TABLET | SUBLINGUAL | 3 refills | Status: AC | PRN

## 2019-12-16 MED ORDER — SODIUM CHLORIDE 0.9% FLUSH: 3.0000 mL | Freq: Two times a day (BID) | INTRAVENOUS | Status: AC

## 2019-12-16 NOTE — Progress Notes (Signed)
Cardiology Office Note  Date: 12/16/2019   ID: [name], DOB [birth year], MRN [id number]  PCP:  Monico Blitz, MD  Cardiologist:  Rozann Lesches, MD Electrophysiologist:  None   Chief Complaint  Patient presents with  . Cardiac follow-up    History of Present Illness: [name] is a 69 y.o. male last seen in January.  He presents today with his wife for follow-up visit.  Over the last several weeks he has experienced worsening dyspnea on exertion and recurring angina symptoms requiring nitroglycerin.  He has stopped walking his dogs due to the symptoms, had to rest when coming in from the parking lot today.  I personally reviewed his ECG today which shows sinus rhythm with left atrial enlargement, right bundle branch block, and left anterior fascicular block.  He does have some trouble with his memory, his wife assists with his medications.  Medical regimen is outlined below and has been stable, we last added to Toprol-XL as an antianginal.  Today we discussed the risks and benefits of a follow-up diagnostic cardiac catheterization to assess for any potential revascularization options.  CABG with known graft disease, last assessed in 2017.  Past Medical History:  Diagnosis Date  . Anxiety   . Arthritis   . CAD (coronary artery disease)    a. s/p CABG, DES to SVG to PLA, DES to RCA  b. low risk nuclear stress test 04/2014  . COPD (chronic obstructive pulmonary disease) ([place])   . Dementia ([place])   . Depression   . Dyslipidemia   . Essential hypertension   . GERD (gastroesophageal reflux disease)   . History of Bell's palsy   . Myocardial infarction ([place]) 12/2010  . Type 2 diabetes mellitus ([place])     Past Surgical History:  Procedure Laterality Date  . APPENDECTOMY    . CARDIAC CATHETERIZATION  04/15/2016  . CARDIAC CATHETERIZATION N/A 04/15/2016   Procedure: Left Heart Cath and Cors/Grafts Angiography;  Surgeon: Belva Crome, MD;  Location: [facility];   Service: Cardiovascular;  Laterality: N/A;  . CHOLECYSTECTOMY OPEN    . CORONARY ARTERY BYPASS GRAFT  01/01/2011   LIMA to LAD, SVG to PLA, SVG to PDA  . HAMMER TOE SURGERY Bilateral   . KNEE ARTHROSCOPY Left   . LEFT HEART CATHETERIZATION WITH CORONARY/GRAFT ANGIOGRAM N/A 03/01/2012   Procedure: LEFT HEART CATHETERIZATION WITH Beatrix Fetters;  Surgeon: Sherren Mocha, MD;  Location: [facility] CATH LAB;  Service: Cardiovascular;  Laterality: N/A;  . ROTATOR CUFF REPAIR Bilateral   . SHOULDER OPEN ROTATOR CUFF REPAIR Bilateral   . TONSILLECTOMY     tubes  . TYMPANOMASTOIDECTOMY Right 05/20/2013   Procedure: RIGHT CANAL UP TYMPANOMASTOIDECTOMY WITH TYPE I TYMPANOPLASTY AND INSERTION OF A TUBE;  Surgeon: Fannie Knee, MD;  Location: [facility];  Service: ENT;  Laterality: Right;    Current Outpatient Medications  Medication Sig Dispense Refill  . aspirin 81 MG EC tablet Take 81 mg by mouth daily.      Marland Kitchen buPROPion (WELLBUTRIN SR) 150 MG 12 hr tablet Take 1 tablet (150 mg total) by mouth 2 (two) times daily. 180 tablet 2  . Cholecalciferol (VITAMIN D3 PO) Take 1,000 mg by mouth 2 (two) times daily.     . clopidogrel (PLAVIX) 75 MG tablet Take 75 mg by mouth daily.    Marland Kitchen gabapentin (NEURONTIN) 300 MG capsule Take 300 mg by mouth 2 (two) times daily.    Marland Kitchen glimepiride (AMARYL) 4 MG tablet Take  4 mg by mouth daily with breakfast.    . isosorbide mononitrate (IMDUR) 30 MG 24 hr tablet Take 1 tablet (30 mg total) by mouth 2 (two) times daily. 180 tablet 3  . lisinopril (PRINIVIL,ZESTRIL) 5 MG tablet Take 5 mg by mouth daily.    . metFORMIN (GLUCOPHAGE) 1000 MG tablet Take 1 tablet (1,000 mg total) by mouth 2 (two) times daily with a meal. HOLD 10/21.  May resume on 10/22.    Marland Kitchen metoprolol succinate (TOPROL XL) 25 MG 24 hr tablet Take 1 tablet (25 mg total) by mouth daily. 90 tablet 2  . Multiple Vitamin (MULTIVITAMIN WITH MINERALS) TABS tablet Take 1 tablet by mouth daily.    . nitroGLYCERIN (NITROSTAT)  0.4 MG SL tablet Place 1 tablet (0.4 mg total) under the tongue every 5 (five) minutes x 3 doses as needed for chest pain (if no relief after 3 rd dose, proceed to the ED for an evaluation or call 911). For chest pain 25 tablet 0  . pantoprazole (PROTONIX) 40 MG tablet Take 40 mg by mouth every evening.     . rosuvastatin (CRESTOR) 5 MG tablet Take 1 tablet (5 mg total) by mouth daily. 90 tablet 3  . ziprasidone (GEODON) 20 MG capsule Take 20 mg by mouth daily.     No current facility-administered medications for this visit.   Allergies:  Bee venom and Codeine   Social history: Social History   Tobacco Use  . Smoking status: Former Smoker    Packs/day: 1.00    Years: 50.00    Pack years: 50.00    Types: Cigarettes    Quit date: 12/26/2010    Years since quitting: 8.9  . Smokeless tobacco: Never Used  Substance Use Topics  . Alcohol use: Yes    Alcohol/week: 0.0 standard drinks    Comment: sober since 1988   Family history: Family History  Problem Relation Age of Onset  . Alcohol abuse Brother   . Diabetes Brother   . Heart disease Mother   . Glaucoma Mother   . Heart disease Father   . Diabetes Father   . Diabetes Sister    ROS:   No palpitations or syncope.  Physical Exam: VS:  BP 138/80   Pulse 63   Ht 5\' 8"  (1.727 m)   Wt 176 lb (79.8 kg)   SpO2 98%   BMI 26.76 kg/m , BMI Body mass index is 26.76 kg/m.  Wt Readings from Last 3 Encounters:  12/16/19 176 lb (79.8 kg)  07/16/19 184 lb (83.5 kg)  05/16/19 181 lb (82.1 kg)    General: Chronically ill-appearing male, no distress.  Using a cane. HEENT: Conjunctiva and lids normal, wearing a mask. Neck: Supple, no elevated JVP or carotid bruits, no thyromegaly. Lungs: Clear to auscultation, nonlabored breathing at rest. Cardiac: Regular rate and rhythm, no S3, soft systolic murmur, no pericardial rub. Abdomen: Soft, nontender, bowel sounds present, no guarding or rebound. Extremities: No pitting edema, distal  pulses 2+. Skin: Warm and dry. Musculoskeletal: No kyphosis. Neuropsychiatric: Alert and oriented x3, affect grossly appropriate.  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  Recent Labwork:  July 2020: Hemoglobin A1c 7%, BUN 13, creatinine 1.18, potassium 4.7, AST 31, ALT 33, cholesterol 183, triglycerides 178, HDL 40, LDL 107, hemoglobin 11.2, platelets 318, TSH 1.99  Other Studies Reviewed Today:  Echocardiogram 04/26/2019: 1. Left  ventricular ejection fraction, by visual estimation, is 50%. The left ventricle has low normal function. There is mildly increased left ventricular hypertrophy. 2. Apical anterior segment, apical inferior segment, and apex are abnormal. 3. Left ventricular diastolic parameters are consistent with Grade I diastolic dysfunction (impaired relaxation). 4. Global right ventricle has normal systolic function.The right ventricular size is normal. No increase in right ventricular wall thickness. 5. Left atrial size was normal. 6. Right atrial size was normal. 7. Mild aortic valve annular calcification. 8. Mild mitral annular calcification. 9. The mitral valve is degenerative. Mild mitral valve regurgitation. 10. The tricuspid valve is grossly normal. Tricuspid valve regurgitation is trivial. 11. The aortic valve is tricuspid. Aortic valve regurgitation is not visualized. Mild aortic valve sclerosis without stenosis. 12. There is Mild thickening of the aortic valve. 13. The pulmonic valve was grossly normal. Pulmonic valve regurgitation is trivial. 14. Aortic dilatation noted. 15. There is mild dilatation of the aortic root measuring 38 mm. 16. The inferior vena cava is normal in size with greater than 50% respiratory variability, suggesting right atrial pressure of 3 mmHg.  Cardiac catheterization 04/15/2016:  Post Atrio-2 lesion, 70  %stenosed.  Post Atrio-1 lesion, 60 %stenosed.  SVG graft was visualized by angiography and is normal in caliber.  SVG.  Origin to Prox Graft lesion, 100 %stenosed.  LIMA and is small.  3rd RPLB lesion, 70 %stenosed.  Origin to Dist Graft lesion, 99 %stenosed.  Ost 1st Mrg to 1st Mrg lesion, 40 %stenosed.  Mid LAD to Dist LAD lesion, 60 %stenosed.  Ost 1st Diag lesion, 50 %stenosed.  The left ventricular ejection fraction is 35-45% by visual estimate.   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  SVG to RCA left ventricular branch is widely patent.  Left main is widely patent. There is segmental 50% proximal to mid LAD. Second diagonal contains 50% ostial narrowing. The first obtuse marginal contains 40% narrowing.  Mild to moderate decrease in LV function with an estimated ejection fraction of 35-40%.  When compared to the prior study, coronary and bypass graft anatomy is unchanged.  Assessment and Plan:  1.  Accelerating angina on stable medical therapy including aspirin, Plavix, lisinopril, Imdur, Toprol-XL, and Crestor.  ECG reviewed without acute changes today.  Risks and benefits discussed with patient and wife regarding follow-up diagnostic cardiac catheterization to assess for any potential revascularization options.  They are in agreement and this will be scheduled for this week.  2.  Ischemic cardiomyopathy with LVEF approximately 50% by echocardiogram in October 2020.  3.  Mixed hyperlipidemia, he continues on Crestor and will have follow-up lab work with PCP later this year.  Medication Adjustments/Labs and Tests Ordered: Current medicines are reviewed at length with the patient today.  Concerns regarding medicines are outlined above.   Tests Ordered: Orders Placed This Encounter  Procedures  . Basic Metabolic Panel (BMET)  . CBC  . EKG 12-Lead    Medication Changes: No orders of the defined types  were placed in this encounter.   Disposition:  Follow up after procedure.  Signed, Satira Sark, MD, [facility] 12/16/2019 4:32 PM    [facility] [place], [place], [place] [address] Phone: [phone]; Fax: [phone]

## 2019-12-16 NOTE — Patient Instructions (Addendum)
Your physician wants you to follow-up in: [place], NP   Your physician recommends that you continue on your current medications as directed. Please refer to the Current Medication list given to you today.  Your physician has requested that you have a cardiac catheterization. Cardiac catheterization is used to diagnose and/or treat various heart conditions. Doctors may recommend this procedure for a number of different reasons. The most common reason is to evaluate chest pain. Chest pain can be a symptom of coronary artery disease (CAD), and cardiac catheterization can show whether plaque is narrowing or blocking your heart's arteries. This procedure is also used to evaluate the valves, as well as measure the blood flow and oxygen levels in different parts of your heart. For further information please visit [email or web address]. Please follow instruction sheet, as given.  Your physician recommends that you return for lab work in: [facility] CATH BMP/CBC  Thank you for choosing [facility]!!

## 2019-12-16 NOTE — Telephone Encounter (Signed)
Pre-cert Verification for the following procedure    LHC 6/24 @ 9AM DR Tamala Julian

## 2019-12-17 ENCOUNTER — Encounter (INDEPENDENT_AMBULATORY_CARE_PROVIDER_SITE_OTHER): Payer: Self-pay | Admitting: Otolaryngology

## 2019-12-17 ENCOUNTER — Other Ambulatory Visit (HOSPITAL_COMMUNITY)
Admission: RE | Admit: 2019-12-17 | Discharge: 2019-12-17 | Disposition: A | Discharge status: Home / Self Care | Payer: Medicare Other | Source: Other Acute Inpatient Hospital | Attending: Cardiology | Admitting: Cardiology

## 2019-12-17 ENCOUNTER — Ambulatory Visit (INDEPENDENT_AMBULATORY_CARE_PROVIDER_SITE_OTHER): Payer: Medicare Other | Admitting: Otolaryngology

## 2019-12-17 VITALS — Temp 97.5°F

## 2019-12-17 DIAGNOSIS — I25118 Atherosclerotic heart disease of native coronary artery with other forms of angina pectoris: Secondary | ICD-10-CM | POA: Diagnosis not present

## 2019-12-17 DIAGNOSIS — H903 Sensorineural hearing loss, bilateral: Secondary | ICD-10-CM | POA: Diagnosis not present

## 2019-12-17 LAB — CBC
HCT: 34 % — ABNORMAL LOW (ref 39.0–52.0)
Hemoglobin: 9.7 g/dL — ABNORMAL LOW (ref 13.0–17.0)
MCH: 23.9 pg — ABNORMAL LOW (ref 26.0–34.0)
MCHC: 28.5 g/dL — ABNORMAL LOW (ref 30.0–36.0)
MCV: 83.7 fL (ref 80.0–100.0)
Platelets: 421 10*3/uL — ABNORMAL HIGH (ref 150–400)
RBC: 4.06 MIL/uL — ABNORMAL LOW (ref 4.22–5.81)
RDW: 16.8 % — ABNORMAL HIGH (ref 11.5–15.5)
WBC: 16.1 10*3/uL — ABNORMAL HIGH (ref 4.0–10.5)
nRBC: 0 % (ref 0.0–0.2)

## 2019-12-17 LAB — BASIC METABOLIC PANEL
Anion gap: 13 (ref 5–15)
BUN: 8 mg/dL (ref 8–23)
CO2: 24 mmol/L (ref 22–32)
Calcium: 8.8 mg/dL — ABNORMAL LOW (ref 8.9–10.3)
Chloride: 94 mmol/L — ABNORMAL LOW (ref 98–111)
Creatinine, Ser: 1.21 mg/dL (ref 0.61–1.24)
GFR calc Af Amer: >60 mL/min (ref >60)
GFR calc non Af Amer: >60 mL/min (ref >60)
Glucose, Bld: 266 mg/dL — ABNORMAL HIGH (ref 70–99)
Potassium: 3.7 mmol/L (ref 3.5–5.1)
Sodium: 131 mmol/L — ABNORMAL LOW (ref 135–145)

## 2019-12-17 NOTE — Progress Notes (Signed)
HPI: [name] is a 69 y.o. male who returns today for evaluation of hearing loss.  He wears bilateral hearing aids from [facility].  He had an audiogram performed in December of last year that demonstrated a right ear 20-30 DB conductive hearing loss compared to the left which showed a mild to moderate downsloping sensorineural hearing loss. He had had previous right ear surgery in 1991 performed at [facility]..  Past Medical History:  Diagnosis Date  . Anxiety   . Arthritis   . CAD (coronary artery disease)    a. s/p CABG, DES to SVG to PLA, DES to RCA  b. low risk nuclear stress test 04/2014  . COPD (chronic obstructive pulmonary disease) ([place])   . Dementia ([place])   . Depression   . Dyslipidemia   . Essential hypertension   . GERD (gastroesophageal reflux disease)   . History of Bell's palsy   . Myocardial infarction ([place]) 12/2010  . Type 2 diabetes mellitus ([place])    Past Surgical History:  Procedure Laterality Date  . APPENDECTOMY    . CARDIAC CATHETERIZATION  04/15/2016  . CARDIAC CATHETERIZATION N/A 04/15/2016   Procedure: Left Heart Cath and Cors/Grafts Angiography;  Surgeon: Belva Crome, MD;  Location: [facility] LAB;  Service: Cardiovascular;  Laterality: N/A;  . CHOLECYSTECTOMY OPEN    . CORONARY ARTERY BYPASS GRAFT  01/01/2011   LIMA to LAD, SVG to PLA, SVG to PDA  . HAMMER TOE SURGERY Bilateral   . KNEE ARTHROSCOPY Left   . LEFT HEART CATHETERIZATION WITH CORONARY/GRAFT ANGIOGRAM N/A 03/01/2012   Procedure: LEFT HEART CATHETERIZATION WITH Beatrix Fetters;  Surgeon: Sherren Mocha, MD;  Location: [facility] CATH LAB;  Service: Cardiovascular;  Laterality: N/A;  . ROTATOR CUFF REPAIR Bilateral   . SHOULDER OPEN ROTATOR CUFF REPAIR Bilateral   . TONSILLECTOMY     tubes  . TYMPANOMASTOIDECTOMY Right 05/20/2013   Procedure: RIGHT CANAL UP TYMPANOMASTOIDECTOMY WITH TYPE I TYMPANOPLASTY AND INSERTION OF A TUBE;  Surgeon: Fannie Knee, MD;  Location: [facility];   Service: ENT;  Laterality: Right;   Social History   Socioeconomic History  . Marital status: Married    Spouse name: Not on file  . Number of children: Not on file  . Years of education: Not on file  . Highest education level: Not on file  Occupational History  . Occupation: disabled    Comment: prior drove forklift  Tobacco Use  . Smoking status: Former Smoker    Packs/day: 1.00    Years: 50.00    Pack years: 50.00    Types: Cigarettes    Quit date: 12/26/2010    Years since quitting: 8.9  . Smokeless tobacco: Never Used  Substance and Sexual Activity  . 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 Drug use: No  . Sexual activity: Not Currently  Other Topics Concern  . Not on file  Social History Narrative   Disabled from prior shoulder surgery,he worked at a copper [place] Strain:   . Difficulty of Paying Living Expenses:   Food Insecurity:   . Worried About Charity fundraiser in the Last Year:   . Arboriculturist in the Last Year:   Transportation Needs:   . Film/video editor (Medical):   Marland Kitchen Lack of Transportation (Non-Medical):   Physical Activity:   .  Days of Exercise per Week:   . Minutes of Exercise per Session:   Stress:   . Feeling of Stress :   Social Connections:   . Frequency of Communication with Friends and Family:   . Frequency of Social Gatherings with Friends and Family:   . Attends Religious Services:   . Active Member of Clubs or Organizations:   . Attends Archivist Meetings:   Marland Kitchen Marital Status:    Family History  Problem Relation Age of Onset  . Alcohol abuse Brother   . Diabetes Brother   . Heart disease Mother   . Glaucoma Mother   . Heart disease Father   . Diabetes Father   . Diabetes Sister    Allergies  Allergen Reactions  . Bee Venom Anaphylaxis  . Codeine Shortness Of Breath and Other (See Comments)   Prior  to Admission medications   Medication Sig Start Date End Date Taking? Authorizing Provider  aspirin 81 MG EC tablet Take 81 mg by mouth at bedtime.    Yes [provider]  buPROPion (WELLBUTRIN SR) 150 MG 12 hr tablet Take 1 tablet (150 mg total) by mouth 2 (two) times daily. 05/14/15  Yes Cloria Spring, MD  clopidogrel (PLAVIX) 75 MG tablet Take 75 mg by mouth daily.   Yes [provider]  gabapentin (NEURONTIN) 300 MG capsule Take 300 mg by mouth 2 (two) times daily.   Yes [provider]  glimepiride (AMARYL) 4 MG tablet Take 4 mg by mouth daily with breakfast.   Yes [provider]  isosorbide mononitrate (IMDUR) 30 MG 24 hr tablet Take 1 tablet (30 mg total) by mouth 2 (two) times daily. 05/16/19 12/17/19 Yes Strader, Fransisco Hertz, PA-C  lisinopril (PRINIVIL,ZESTRIL) 5 MG tablet Take 5 mg by mouth daily.   Yes [provider]  metFORMIN (GLUCOPHAGE) 1000 MG tablet Take 1 tablet (1,000 mg total) by mouth 2 (two) times daily with a meal. HOLD 10/21.  May resume on 10/22. Patient taking differently: Take 1,000 mg by mouth 2 (two) times daily with a meal.  04/16/16  Yes Rama, Venetia Maxon, MD  metoprolol succinate (TOPROL XL) 25 MG 24 hr tablet Take 1 tablet (25 mg total) by mouth daily. 07/16/19  Yes Satira Sark, MD  Multiple Vitamin (MULTIVITAMIN WITH MINERALS) TABS tablet Take 1 tablet by mouth at bedtime. Centrum Silver   Yes [provider]  nitroGLYCERIN (NITROSTAT) 0.4 MG SL tablet Place 1 tablet (0.4 mg total) under the tongue every 5 (five) minutes x 3 doses as needed for chest pain (if no relief after 3 rd dose, proceed to the ED for an evaluation or call 911). 12/16/19  Yes Satira Sark, MD  pantoprazole (PROTONIX) 40 MG tablet Take 40 mg by mouth every evening.    Yes [provider]  polyethylene glycol powder (GLYCOLAX/MIRALAX) 17 GM/SCOOP powder Take 17 g by mouth daily.   Yes [provider]  Psyllium  (METAMUCIL FIBER PO) Take 2 tablets by mouth at bedtime.   Yes [provider]  rosuvastatin (CRESTOR) 5 MG tablet Take 1 tablet (5 mg total) by mouth daily. Patient taking differently: Take 5 mg by mouth at bedtime.  05/01/19 12/17/19 Yes Satira Sark, MD  sodium chloride (OCEAN) 0.65 % SOLN nasal spray Place 1 spray into both nostrils daily as needed for congestion.   Yes [provider]  ziprasidone (GEODON) 20 MG capsule Take 20 mg by mouth at bedtime.  Yes [provider]     Positive ROS: Otherwise negative  All other systems have been reviewed and were otherwise negative with the exception of those mentioned in the HPI and as above.  Physical Exam: Constitutional: Alert, well-appearing, no acute distress Ears: External ears without lesions or tenderness.  Right TM is very thickened with poor mobility on pneumatic otoscopy although there is no perforation on exam or on pneumatic otoscopy.  Left TM appears much clear.  No left TM perforation.  On tuning fork testing he heard much better on the left side compared to the right.  Weber actually lateralized to the left side.  AC > BC on the left side.  And AC equivocal to [facility] on the right side. Nasal: External nose without lesions Clear nasal passages Oral: Lips and gums without lesions. Tongue and palate mucosa without lesions. Posterior oropharynx clear. Neck: No palpable adenopathy or masses Respiratory: Breathing comfortably  Skin: No facial/neck lesions or rash noted.  Procedures  Assessment: Moderate bilateral downsloping sensorineural hearing loss in both ears with additional conductive component in the right ear.  SRT's were 50 dB on the right and 40 dB on the left.  Plan: Reassured him of no evidence of TM perforation. Would proceed with use of hearing aids per recommendation at [facility].   Radene Journey, MD

## 2019-12-18 ENCOUNTER — Telehealth: Payer: Self-pay | Admitting: *Deleted

## 2019-12-18 ENCOUNTER — Telehealth: Payer: Self-pay | Admitting: Cardiology

## 2019-12-18 DIAGNOSIS — I10 Essential (primary) hypertension: Secondary | ICD-10-CM | POA: Diagnosis not present

## 2019-12-18 DIAGNOSIS — R21 Rash and other nonspecific skin eruption: Secondary | ICD-10-CM | POA: Diagnosis not present

## 2019-12-18 DIAGNOSIS — R350 Frequency of micturition: Secondary | ICD-10-CM | POA: Diagnosis not present

## 2019-12-18 DIAGNOSIS — I700 Atherosclerosis of aorta: Secondary | ICD-10-CM | POA: Diagnosis not present

## 2019-12-18 DIAGNOSIS — Z299 Encounter for prophylactic measures, unspecified: Secondary | ICD-10-CM | POA: Diagnosis not present

## 2019-12-18 DIAGNOSIS — R0602 Shortness of breath: Secondary | ICD-10-CM | POA: Diagnosis not present

## 2019-12-18 NOTE — Telephone Encounter (Signed)
Thank you for letting me know.  I am glad that they got further evaluation.

## 2019-12-18 NOTE — Telephone Encounter (Signed)
Note LHC appt for tomorrow has been cancelled per Tamala Julian

## 2019-12-18 NOTE — Telephone Encounter (Signed)
Noted.  Perhaps getting him admitted to [facility] for repeat lab work and consideration of any further testing prior to his heart catheterization would be reasonable option in this case.

## 2019-12-18 NOTE — Telephone Encounter (Signed)
-----   Message from Satira Sark, MD sent at 12/18/2019 10:04 AM EDT ----- Regarding: RE: LHC/G 12/19/19-- I am not sure what to make of those results based on recent encounter.  No obvious infectious symptoms to explain his leukocytosis, he was mildly anemic last year but hemoglobin has decreased since, he did not report any bleeding issues recently.  I think we should hold off on the procedure and ask him to follow-up with his PCP to further investigate these abnormalities first. ----- Message ----- From: Katrine Coho, RN Sent: 12/18/2019   9:54 AM EDT To: Belva Crome, MD, Katrine Coho, RN, # Subject: LHC/G 12/19/19--                                Hi Dr Domenic Polite,  12/17/19 Hgb 9.7/ WBC 16.1  Proceed with LHC/G tomorrow?  Thanks,  Webb Silversmith

## 2019-12-18 NOTE — Telephone Encounter (Signed)
Spoke with wife and explained the need to postpone heart cath and advised that patient needed to f/u with his PCP first. Wife says she has spoken with Desiree Lucy about proceeding with heart cath. Advised that a message has been sent to provider about this for final decision. Advised that she still needed to contact his PCP and that lab results have been sent to PCP office. Verbalized understanding.

## 2019-12-18 NOTE — Telephone Encounter (Signed)
LHC appt for tomorrow has been cancelled per [facility].

## 2019-12-18 NOTE — Telephone Encounter (Signed)
Patient has been diagnosed with pneumonia . They need to cancel Cardiac catheterization.

## 2019-12-18 NOTE — Telephone Encounter (Signed)
Patient's wife informed and verbalized understanding of plan. Patient's wife says that patient is going to PCP for an evaluation now. Advised to contact our office before closing today for cancelling heart cath.

## 2019-12-18 NOTE — Telephone Encounter (Signed)
Noted LHC already canceled.

## 2019-12-18 NOTE — Telephone Encounter (Signed)
LHC/G scheduled for 12/19/19-lab findings 12/17/19-Hgb 9.7/WBC 16.1 ____- Copied from 12/18/19 staff message from  Dr Domenic Polite: I am not sure what to make of those results based on recent encounter. No obvious infectious symptoms to explain his leukocytosis, he was mildly anemic last year but hemoglobin has decreased since, he did not report any bleeding issues recently. I think we should hold off on the procedure and ask him to follow-up with his PCP to further investigate these abnormalities first.  _____  I spoke to patient's wife(DPR), Izora Gala and discussed Dr McDowell's recommendations above. Izora Gala states patient had colonoscopy about a month ago and "everything was fine there". Izora Gala states she is concerned about delaying LHC/G because patient's symptoms of chest pain and shortness of breath have not improved and may be getting worse.  Izora Gala advised I will forward to Dr McDowell/RN  for review and recommendations. I have not cancelled LHC/G scheduled for tomorrow at this time.

## 2019-12-19 ENCOUNTER — Ambulatory Visit (HOSPITAL_COMMUNITY)
Admission: RE | Admit: 2019-12-19 | Payer: Medicare Other | Source: Home / Self Care | Admitting: Interventional Cardiology

## 2019-12-19 ENCOUNTER — Encounter (HOSPITAL_COMMUNITY): Admission: RE | Payer: Self-pay | Source: Home / Self Care

## 2019-12-19 SURGERY — LEFT HEART CATH AND CORS/GRAFTS ANGIOGRAPHY
Anesthesia: LOCAL

## 2019-12-25 DIAGNOSIS — Z299 Encounter for prophylactic measures, unspecified: Secondary | ICD-10-CM | POA: Diagnosis not present

## 2019-12-25 DIAGNOSIS — I10 Essential (primary) hypertension: Secondary | ICD-10-CM | POA: Diagnosis not present

## 2019-12-25 DIAGNOSIS — R5383 Other fatigue: Secondary | ICD-10-CM | POA: Diagnosis not present

## 2019-12-25 DIAGNOSIS — R0602 Shortness of breath: Secondary | ICD-10-CM | POA: Diagnosis not present

## 2019-12-25 DIAGNOSIS — E1165 Type 2 diabetes mellitus with hyperglycemia: Secondary | ICD-10-CM | POA: Diagnosis not present

## 2020-01-06 DIAGNOSIS — H35033 Hypertensive retinopathy, bilateral: Secondary | ICD-10-CM | POA: Diagnosis not present

## 2020-01-07 ENCOUNTER — Encounter (INDEPENDENT_AMBULATORY_CARE_PROVIDER_SITE_OTHER): Payer: Self-pay

## 2020-01-16 DIAGNOSIS — H26493 Other secondary cataract, bilateral: Secondary | ICD-10-CM | POA: Diagnosis not present

## 2020-01-16 DIAGNOSIS — H35033 Hypertensive retinopathy, bilateral: Secondary | ICD-10-CM | POA: Diagnosis not present

## 2020-01-16 DIAGNOSIS — E113293 Type 2 diabetes mellitus with mild nonproliferative diabetic retinopathy without macular edema, bilateral: Secondary | ICD-10-CM | POA: Diagnosis not present

## 2020-01-16 DIAGNOSIS — Z961 Presence of intraocular lens: Secondary | ICD-10-CM | POA: Diagnosis not present

## 2020-01-17 ENCOUNTER — Ambulatory Visit: Payer: Medicare Other | Admitting: Cardiology

## 2020-01-20 DIAGNOSIS — J984 Other disorders of lung: Secondary | ICD-10-CM | POA: Diagnosis not present

## 2020-01-20 DIAGNOSIS — K449 Diaphragmatic hernia without obstruction or gangrene: Secondary | ICD-10-CM | POA: Diagnosis not present

## 2020-01-20 DIAGNOSIS — R911 Solitary pulmonary nodule: Secondary | ICD-10-CM | POA: Diagnosis not present

## 2020-01-20 DIAGNOSIS — R0602 Shortness of breath: Secondary | ICD-10-CM | POA: Diagnosis not present

## 2020-01-20 DIAGNOSIS — J439 Emphysema, unspecified: Secondary | ICD-10-CM | POA: Diagnosis not present

## 2020-01-22 DIAGNOSIS — R350 Frequency of micturition: Secondary | ICD-10-CM | POA: Diagnosis not present

## 2020-01-22 DIAGNOSIS — Z299 Encounter for prophylactic measures, unspecified: Secondary | ICD-10-CM | POA: Diagnosis not present

## 2020-01-22 DIAGNOSIS — R918 Other nonspecific abnormal finding of lung field: Secondary | ICD-10-CM | POA: Diagnosis not present

## 2020-01-22 DIAGNOSIS — E1165 Type 2 diabetes mellitus with hyperglycemia: Secondary | ICD-10-CM | POA: Diagnosis not present

## 2020-01-23 ENCOUNTER — Other Ambulatory Visit: Payer: Self-pay | Admitting: Cardiology

## 2020-01-28 DIAGNOSIS — I2510 Atherosclerotic heart disease of native coronary artery without angina pectoris: Secondary | ICD-10-CM | POA: Diagnosis not present

## 2020-01-28 DIAGNOSIS — C3411 Malignant neoplasm of upper lobe, right bronchus or lung: Secondary | ICD-10-CM | POA: Diagnosis not present

## 2020-01-28 DIAGNOSIS — R195 Other fecal abnormalities: Secondary | ICD-10-CM | POA: Diagnosis not present

## 2020-02-04 ENCOUNTER — Other Ambulatory Visit (HOSPITAL_COMMUNITY): Payer: Self-pay | Admitting: Oncology

## 2020-02-04 DIAGNOSIS — Z299 Encounter for prophylactic measures, unspecified: Secondary | ICD-10-CM | POA: Diagnosis not present

## 2020-02-04 DIAGNOSIS — G219 Secondary parkinsonism, unspecified: Secondary | ICD-10-CM | POA: Diagnosis not present

## 2020-02-04 DIAGNOSIS — I10 Essential (primary) hypertension: Secondary | ICD-10-CM | POA: Diagnosis not present

## 2020-02-04 DIAGNOSIS — C3411 Malignant neoplasm of upper lobe, right bronchus or lung: Secondary | ICD-10-CM

## 2020-02-04 DIAGNOSIS — R918 Other nonspecific abnormal finding of lung field: Secondary | ICD-10-CM | POA: Diagnosis not present

## 2020-02-04 DIAGNOSIS — E1165 Type 2 diabetes mellitus with hyperglycemia: Secondary | ICD-10-CM | POA: Diagnosis not present

## 2020-02-04 DIAGNOSIS — G309 Alzheimer's disease, unspecified: Secondary | ICD-10-CM | POA: Diagnosis not present

## 2020-02-08 DIAGNOSIS — R0902 Hypoxemia: Secondary | ICD-10-CM | POA: Diagnosis not present

## 2020-02-08 DIAGNOSIS — R404 Transient alteration of awareness: Secondary | ICD-10-CM | POA: Diagnosis not present

## 2020-02-08 DIAGNOSIS — I499 Cardiac arrhythmia, unspecified: Secondary | ICD-10-CM | POA: Diagnosis not present

## 2020-02-08 DIAGNOSIS — Z743 Need for continuous supervision: Secondary | ICD-10-CM | POA: Diagnosis not present

## 2020-02-08 DIAGNOSIS — E1165 Type 2 diabetes mellitus with hyperglycemia: Secondary | ICD-10-CM | POA: Diagnosis not present

## 2020-02-13 ENCOUNTER — Encounter (HOSPITAL_COMMUNITY): Payer: Self-pay

## 2020-02-13 ENCOUNTER — Ambulatory Visit (HOSPITAL_COMMUNITY): Payer: Medicare Other

## 2020-02-26 DEATH — deceased
